# Patient Record
Sex: Male | Born: 1990
Health system: Southern US, Community
[De-identification: ages and names within clinical notes are randomized; demographics above are authoritative.]

## PROBLEM LIST (undated history)

## (undated) DIAGNOSIS — F329 Major depressive disorder, single episode, unspecified: Secondary | ICD-10-CM

## (undated) DIAGNOSIS — A749 Chlamydial infection, unspecified: Secondary | ICD-10-CM

## (undated) DIAGNOSIS — I10 Essential (primary) hypertension: Secondary | ICD-10-CM

## (undated) DIAGNOSIS — B20 Human immunodeficiency virus [HIV] disease: Secondary | ICD-10-CM

## (undated) DIAGNOSIS — F640 Transsexualism: Secondary | ICD-10-CM

## (undated) DIAGNOSIS — K5903 Drug induced constipation: Secondary | ICD-10-CM

## (undated) DIAGNOSIS — F32A Depression, unspecified: Secondary | ICD-10-CM

## (undated) DIAGNOSIS — A539 Syphilis, unspecified: Secondary | ICD-10-CM

## (undated) DIAGNOSIS — E785 Hyperlipidemia, unspecified: Secondary | ICD-10-CM

## (undated) DIAGNOSIS — Z789 Other specified health status: Secondary | ICD-10-CM

## (undated) DIAGNOSIS — Z21 Asymptomatic human immunodeficiency virus [HIV] infection status: Secondary | ICD-10-CM

## (undated) DIAGNOSIS — F419 Anxiety disorder, unspecified: Secondary | ICD-10-CM

## (undated) DIAGNOSIS — F17200 Nicotine dependence, unspecified, uncomplicated: Secondary | ICD-10-CM

## (undated) DIAGNOSIS — K649 Unspecified hemorrhoids: Secondary | ICD-10-CM

## (undated) HISTORY — DX: Depression, unspecified: F32.A

## (undated) HISTORY — DX: Drug induced constipation: K59.03

## (undated) HISTORY — DX: Major depressive disorder, single episode, unspecified: F32.9

## (undated) HISTORY — DX: Nicotine dependence, unspecified, uncomplicated: F17.200

## (undated) HISTORY — DX: Anxiety disorder, unspecified: F41.9

## (undated) HISTORY — DX: Human immunodeficiency virus (HIV) disease: B20

## (undated) HISTORY — DX: Transsexualism: F64.0

## (undated) HISTORY — DX: Other specified health status: Z78.9

## (undated) HISTORY — DX: Chlamydial infection, unspecified: A74.9

## (undated) HISTORY — DX: Hyperlipidemia, unspecified: E78.5

## (undated) HISTORY — DX: Syphilis, unspecified: A53.9

## (undated) HISTORY — DX: Unspecified hemorrhoids: K64.9

## (undated) HISTORY — DX: Asymptomatic human immunodeficiency virus (hiv) infection status: Z21

---

## 2014-06-07 ENCOUNTER — Ambulatory Visit (INDEPENDENT_AMBULATORY_CARE_PROVIDER_SITE_OTHER): Payer: Self-pay

## 2014-06-07 DIAGNOSIS — Z113 Encounter for screening for infections with a predominantly sexual mode of transmission: Secondary | ICD-10-CM

## 2014-06-07 DIAGNOSIS — B20 Human immunodeficiency virus [HIV] disease: Secondary | ICD-10-CM

## 2014-06-07 LAB — COMPLETE METABOLIC PANEL WITH GFR
ALBUMIN: 4.1 g/dL (ref 3.5–5.2)
ALT: 12 U/L (ref 0–53)
AST: 15 U/L (ref 0–37)
Alkaline Phosphatase: 73 U/L (ref 39–117)
BUN: 10 mg/dL (ref 6–23)
CALCIUM: 9.2 mg/dL (ref 8.4–10.5)
CHLORIDE: 103 meq/L (ref 96–112)
CO2: 23 meq/L (ref 19–32)
Creat: 0.74 mg/dL (ref 0.50–1.35)
GFR, Est African American: >89 mL/min
GLUCOSE: 87 mg/dL (ref 70–99)
Potassium: 3.8 mEq/L (ref 3.5–5.3)
SODIUM: 136 meq/L (ref 135–145)
Total Bilirubin: 1.1 mg/dL (ref 0.2–1.2)
Total Protein: 7.2 g/dL (ref 6.0–8.3)

## 2014-06-07 LAB — CBC WITH DIFFERENTIAL/PLATELET
Basophils Absolute: 0 10*3/uL (ref 0.0–0.1)
Basophils Relative: 0 % (ref 0–1)
Eosinophils Absolute: 1.7 10*3/uL — ABNORMAL HIGH (ref 0.0–0.7)
Eosinophils Relative: 17 % — ABNORMAL HIGH (ref 0–5)
HEMATOCRIT: 46.3 % (ref 39.0–52.0)
HEMOGLOBIN: 15.8 g/dL (ref 13.0–17.0)
LYMPHS ABS: 3.2 10*3/uL (ref 0.7–4.0)
LYMPHS PCT: 32 % (ref 12–46)
MCH: 30.5 pg (ref 26.0–34.0)
MCHC: 34.1 g/dL (ref 30.0–36.0)
MCV: 89.4 fL (ref 78.0–100.0)
MONO ABS: 0.9 10*3/uL (ref 0.1–1.0)
MONOS PCT: 9 % (ref 3–12)
MPV: 10.3 fL (ref 9.4–12.4)
NEUTROS PCT: 42 % — AB (ref 43–77)
Neutro Abs: 4.2 10*3/uL (ref 1.7–7.7)
Platelets: 319 10*3/uL (ref 150–400)
RBC: 5.18 MIL/uL (ref 4.22–5.81)
RDW: 14.4 % (ref 11.5–15.5)
WBC: 10.1 10*3/uL (ref 4.0–10.5)

## 2014-06-07 LAB — RPR TITER

## 2014-06-07 LAB — RPR: RPR: REACTIVE — AB

## 2014-06-07 LAB — LIPID PANEL
CHOLESTEROL: 210 mg/dL — AB (ref 0–200)
HDL: 40 mg/dL (ref >39)
LDL Cholesterol: 146 mg/dL — ABNORMAL HIGH (ref 0–99)
TRIGLYCERIDES: 119 mg/dL (ref <150)
Total CHOL/HDL Ratio: 5.3 Ratio
VLDL: 24 mg/dL (ref 0–40)

## 2014-06-08 LAB — URINALYSIS
Bilirubin Urine: NEGATIVE
GLUCOSE, UA: NEGATIVE mg/dL
Hgb urine dipstick: NEGATIVE
Ketones, ur: NEGATIVE mg/dL
Leukocytes, UA: NEGATIVE
Nitrite: NEGATIVE
Protein, ur: NEGATIVE mg/dL
Specific Gravity, Urine: 1.027 (ref 1.005–1.030)
UROBILINOGEN UA: 0.2 mg/dL (ref 0.0–1.0)
pH: 5.5 (ref 5.0–8.0)

## 2014-06-08 LAB — HEPATITIS B SURFACE ANTIBODY,QUALITATIVE: Hep B S Ab: POSITIVE — AB

## 2014-06-08 LAB — HEPATITIS C ANTIBODY: HCV Ab: NEGATIVE

## 2014-06-08 LAB — URINE CYTOLOGY ANCILLARY ONLY
CHLAMYDIA, DNA PROBE: NEGATIVE
NEISSERIA GONORRHEA: NEGATIVE

## 2014-06-08 LAB — T-HELPER CELL (CD4) - (RCID CLINIC ONLY)
CD4 % Helper T Cell: 29 % — ABNORMAL LOW (ref 33–55)
CD4 T CELL ABS: 960 /uL (ref 400–2700)

## 2014-06-08 LAB — FLUORESCENT TREPONEMAL AB(FTA)-IGG-BLD: Fluorescent Treponemal ABS: REACTIVE — AB

## 2014-06-08 LAB — HEPATITIS A ANTIBODY, TOTAL: Hep A Total Ab: NONREACTIVE

## 2014-06-08 LAB — HEPATITIS B SURFACE ANTIGEN: Hepatitis B Surface Ag: NEGATIVE

## 2014-06-08 LAB — HEPATITIS B CORE ANTIBODY, TOTAL: Hep B Core Total Ab: NONREACTIVE

## 2014-06-09 LAB — HIV-1 RNA ULTRAQUANT REFLEX TO GENTYP+
HIV 1 RNA Quant: 5378 copies/mL — ABNORMAL HIGH (ref <20)
HIV-1 RNA Quant, Log: 3.73 {Log} — ABNORMAL HIGH (ref <1.30)

## 2014-06-11 ENCOUNTER — Other Ambulatory Visit: Payer: Self-pay | Admitting: *Deleted

## 2014-06-11 ENCOUNTER — Telehealth: Payer: Self-pay | Admitting: Licensed Clinical Social Worker

## 2014-06-11 DIAGNOSIS — B20 Human immunodeficiency virus [HIV] disease: Secondary | ICD-10-CM

## 2014-06-11 LAB — QUANTIFERON TB GOLD ASSAY (BLOOD)
INTERFERON GAMMA RELEASE ASSAY: NEGATIVE
Mitogen value: 9.63 IU/mL
Quantiferon Nil Value: 0.06 IU/mL
Quantiferon Tb Ag Minus Nil Value: 0.01 IU/mL
TB Ag value: 0.07 IU/mL

## 2014-06-11 MED ORDER — EMTRICITABINE-TENOFOVIR DF 200-300 MG PO TABS: 1.0000 | ORAL_TABLET | Freq: Every day | ORAL | Status: DC

## 2014-06-11 MED ORDER — DOLUTEGRAVIR SODIUM 50 MG PO TABS: 50.0000 mg | ORAL_TABLET | Freq: Every day | ORAL | Status: DC

## 2014-06-11 NOTE — Telephone Encounter (Signed)
Need the name of the last office that he was seen so I can fax the release of information. Left patient a message.

## 2014-06-14 LAB — HLA B*5701: HLA-B*5701 w/rflx HLA-B High: NEGATIVE

## 2014-06-14 NOTE — Progress Notes (Signed)
Patient is [facility] for [facility] of HIV. He is currently on Tivicay and Truvada. He admits to taking them everyday, but has limited amount left. He is waiting on his ADAP to be approved so he can get a refill. [facility] 2015. Patient was diagnosed with syphilis in 2013 1:64 treated with bicillin x 3. Patient was diagnosed with syphilis again in 11/17/2013 1:128 and treated with bicillin x 3. I will work on getting records [facility], faxed released to [phone]. Patient was given return visit with us.

## 2014-06-19 LAB — HIV-1 GENOTYPR PLUS

## 2014-06-20 ENCOUNTER — Encounter: Payer: Self-pay | Admitting: Internal Medicine

## 2014-06-20 DIAGNOSIS — E785 Hyperlipidemia, unspecified: Secondary | ICD-10-CM | POA: Insufficient documentation

## 2014-06-20 DIAGNOSIS — Z8619 Personal history of other infectious and parasitic diseases: Secondary | ICD-10-CM | POA: Insufficient documentation

## 2014-06-20 DIAGNOSIS — B20 Human immunodeficiency virus [HIV] disease: Secondary | ICD-10-CM | POA: Insufficient documentation

## 2014-06-21 ENCOUNTER — Ambulatory Visit (INDEPENDENT_AMBULATORY_CARE_PROVIDER_SITE_OTHER): Payer: Self-pay | Admitting: Internal Medicine

## 2014-06-21 ENCOUNTER — Encounter: Payer: Self-pay | Admitting: Internal Medicine

## 2014-06-21 ENCOUNTER — Ambulatory Visit (INDEPENDENT_AMBULATORY_CARE_PROVIDER_SITE_OTHER): Payer: Self-pay | Admitting: *Deleted

## 2014-06-21 VITALS — Temp 99.1°F | Ht 66.5 in | Wt 138.2 lb

## 2014-06-21 DIAGNOSIS — F419 Anxiety disorder, unspecified: Secondary | ICD-10-CM | POA: Insufficient documentation

## 2014-06-21 DIAGNOSIS — F3161 Bipolar disorder, current episode mixed, mild: Secondary | ICD-10-CM

## 2014-06-21 DIAGNOSIS — B20 Human immunodeficiency virus [HIV] disease: Secondary | ICD-10-CM

## 2014-06-21 DIAGNOSIS — Z23 Encounter for immunization: Secondary | ICD-10-CM

## 2014-06-21 DIAGNOSIS — E785 Hyperlipidemia, unspecified: Secondary | ICD-10-CM

## 2014-06-21 DIAGNOSIS — A563 Chlamydial infection of anus and rectum: Secondary | ICD-10-CM | POA: Insufficient documentation

## 2014-06-21 DIAGNOSIS — Z720 Tobacco use: Secondary | ICD-10-CM

## 2014-06-21 DIAGNOSIS — Z8619 Personal history of other infectious and parasitic diseases: Secondary | ICD-10-CM

## 2014-06-21 DIAGNOSIS — F17210 Nicotine dependence, cigarettes, uncomplicated: Secondary | ICD-10-CM | POA: Insufficient documentation

## 2014-06-21 DIAGNOSIS — F3170 Bipolar disorder, currently in remission, most recent episode unspecified: Secondary | ICD-10-CM

## 2014-06-21 DIAGNOSIS — F319 Bipolar disorder, unspecified: Secondary | ICD-10-CM | POA: Insufficient documentation

## 2014-06-21 MED ORDER — ABACAVIR-DOLUTEGRAVIR-LAMIVUD 600-50-300 MG PO TABS: 1.0000 | ORAL_TABLET | Freq: Every day | ORAL | Status: DC

## 2014-06-21 MED ORDER — DARUNAVIR-COBICISTAT 800-150 MG PO TABS: 1.0000 | ORAL_TABLET | Freq: Every day | ORAL | Status: DC

## 2014-06-21 MED ORDER — ARIPIPRAZOLE 20 MG PO TABS: 20.0000 mg | ORAL_TABLET | Freq: Every day | ORAL | Status: DC

## 2014-06-21 NOTE — Progress Notes (Signed)
Patient ID: Jeffery ParrMichael Allbright, male   DOB: Aug 10, 1990, 23 y.o.   MRN: [id number] HPI: Jeffery ParrMichael Gutt is a 23 y.o. male who is a HIV positive who has recently [facility].   Allergies: No Known Allergies  Vitals: Temp: 99.1 F (37.3 C) (12/17 1350) Temp Source: Oral (12/17 1350)  Past Medical History: Past Medical History  Diagnosis Date  . HIV infection   . Hyperlipidemia   . Syphilis 2013 and 2015  . Anxiety   . Chlamydia   . Depression     Social History: History   Social History  . Marital Status: Single    Spouse Name: N/A    Number of Children: N/A  . Years of Education: N/A   Social History Main Topics  . Smoking status: Current Every Day Smoker -- 0.25 packs/day    Types: Cigarettes  . Smokeless tobacco: None  . Alcohol Use: 0.0 oz/week    0 Not specified per week     Comment: occas  . Drug Use: None     Comment: occas  . Sexual Activity: Not Currently     Comment: given condoms   Other Topics Concern  . None   Social History Narrative    Previous Regimen: Insurance underwritertravirine/Trv, DTG/Trv  Current Regimen: Off  HIV Genotype Composite Data Genotype Dates: 06/07/14  Interpretation of Genotype Data per [employer] HIV Database Nucleoside RTIs: M184I, K219R  lamivudine (3TC) High-level resistance abacavir (ABC) Low-level resistance zidovudine (AZT) Susceptible stavudine (D4T) Susceptible didanosine (DDI) Low-level resistance emtricitabine (FTC) High-level resistance tenofovir (TDF) Susceptible   Non-Nucleoside RTIs: V90I  efavirenz (EFV) Susceptible etravirine (ETR) Susceptible nevirapine (NVP) Susceptible rilpivirine (RPV)           Susceptible   Protease Inhibitors  atazanavir/r (ATV/r) Susceptible darunavir/r (DRV/r) Susceptible fosamprenavir/r (FPV/r) Susceptible indinavir/r (IDV/r) Susceptible lopinavir/r (LPV/r) Susceptible nelfinavir (NFV) Susceptible saquinavir/r (SQV/r) Susceptible tipranavir/r (TPV/r) Susceptible    Integrase Inhibitors      Labs: HIV 1 RNA QUANT (copies/mL)  Date Value  06/07/2014 5378*   CD4 T CELL ABS (/uL)  Date Value  06/07/2014 960   HEP B S AB (no units)  Date Value  06/07/2014 POS*   HEPATITIS B SURFACE AG (no units)  Date Value  06/07/2014 NEGATIVE   HCV AB (no units)  Date Value  06/07/2014 NEGATIVE    CrCl: Estimated Creatinine Clearance: 127.4 mL/min (by C-G formula based on Cr of 0.74).  Lipids:    Component Value Date/Time   CHOL 210* 06/07/2014 1159   TRIG 119 06/07/2014 1159   HDL 40 06/07/2014 1159   CHOLHDL 5.3 06/07/2014 1159   VLDL 24 06/07/2014 1159   LDLCALC 146* 06/07/2014 1159    Assessment: 23 yo who has recently tx from [facility] [place]. He has some mental issues that have been on Abilify up there. He lost his ADAP and interruption with meds ensued. When he first started on ART, he was place on etravirine/truvada in a study. Apparently, that regimen was cont after the study. Subsequently, he was changed to DTG/TRV. I don't think his compliance was that good. He has now developed some resistance to ART. Genotype is listed above. We are going to use Triumeq + Prezobix since he has a hx of resistance. He agreed to it. His ADAP now is approved. Of note, his abilify dose is 20mg  qday.   Recommendations: Start Triumeq 1 PO qday Prezcobix 1 PO qday F/u for labs in a few weeks   Clide CliffPham, Minh Quang, PharmD  Clinical Infectious Disease Pharmacist [facility] 06/21/2014, 2:55 PM

## 2014-06-21 NOTE — Progress Notes (Signed)
Patient ID: Jeffery [name], male   DOB: 1990-08-02, 23 y.o.   MRN: [id number]          Patient Active Problem List   Diagnosis Date Noted  . HIV disease 06/20/2014    Priority: High  . History of chlamydia 06/21/2014  . Bipolar disorder 06/21/2014  . Anxiety 06/21/2014  . Cigarette smoker 06/21/2014  . History of syphilis 06/20/2014  . Dyslipidemia 06/20/2014    Patient's Medications  New Prescriptions   No medications on file  Previous Medications   No medications on file  Modified Medications   No medications on file  Discontinued Medications   DOLUTEGRAVIR (TIVICAY) 50 MG TABLET    Take 1 tablet (50 mg total) by mouth daily.   EMTRICITABINE-TENOFOVIR (TRUVADA) 200-300 MG PER TABLET    Take 1 tablet by mouth daily.    Subjective: Jeffery [name] is in for his first visit to reestablish care here. He was diagnosed during routine HIV screening on his college campus in October 2010. He had previously always tested negative. He states that he has exclusively day. He believes he knows who infected him. He entered into care in [place] where he was going to school shortly after his diagnosis. He was not told that his virus had any resistance at that time. His initial regimen was Truvada and Intelence as part of a research study. He was changed to Truvada and Tivicay this past June after he was told that his virus had developed some resistance to Intelence. He moved to [place] recently to live with his mother and ran out of his medications in August. He states that he was not having problems taking his medications before that time but would often very the time of day that he took the medication. He has 2 semesters left to complete his degree in social work.  She also has a history of bipolar disorder and anxiety. He has been hospitalized 2 times (April 2014 and April 2015) after suicide attempts. He states that these have been triggered by stress related to his cousin suicide which occurred  in April. He had been on Abilify and a second mental health medication that he cannot recall the name of. He has also been out of those medications. He is currently not in counseling hearing [name].  He states that he is not in a relationship but has had 52 male partners in the past year. He states that they always use condoms but he has been diagnosed and treated for syphilis 3 times in the past 3 years.  He is currently not working. He states that his mother is aware of his infection as are 2 partners, 2 friends and some family members. He states that he feels he has good support. He recently started smoking cigarettes. He drinks alcohol socially and states that he has no problems although he did have a DWI in January 2014. He has settled all legal issues related to that charge. He smokes marijuana on occasion but has never used any other street drugs.  Review of Systems: Pertinent items are noted in HPI.  Past Medical History  Diagnosis Date  . HIV infection   . Hyperlipidemia   . Syphilis 2013 and 2015  . Anxiety   . Chlamydia   . Depression     History  Substance Use Topics  . Smoking status: Current Every Day Smoker -- 0.25 packs/day    Types: Cigarettes  . Smokeless tobacco: Not on file  . Alcohol Use: 0.0 oz/week  0 Not specified per week     Comment: occas    Family History  Problem Relation Age of Onset  . Hypertension Mother     No Known Allergies  Objective: Temp: 99.1 F (37.3 C) (12/17 1350) Temp Source: Oral (12/17 1350) Body mass index is 21.98 kg/(m^2).  General: He is in no distress Oral: No oropharyngeal lesions. He has a cracked right mandibular molar Skin: He has a series of moles on his left hand that he describes as a birthmark. He has a skin tag on his lower abdomen. He has a tattoo on his right abdomen Lungs: Clear Cor: Regular S1 and S2 with no murmur Abdomen: Soft and nontender Joints and extremities: Normal Neuro: Alert with normal  speech and conversation Mood: He does not appear to be anxious or depressed currently  Lab Results Lab Results  Component Value Date   WBC 10.1 06/07/2014   HGB 15.8 06/07/2014   HCT 46.3 06/07/2014   MCV 89.4 06/07/2014   PLT 319 06/07/2014    Lab Results  Component Value Date   CREATININE 0.74 06/07/2014   BUN 10 06/07/2014   NA 136 06/07/2014   K 3.8 06/07/2014   CL 103 06/07/2014   CO2 23 06/07/2014    Lab Results  Component Value Date   ALT 12 06/07/2014   AST 15 06/07/2014   ALKPHOS 73 06/07/2014   BILITOT 1.1 06/07/2014    Lab Results  Component Value Date   CHOL 210* 06/07/2014   HDL 40 06/07/2014   LDLCALC 146* 06/07/2014   TRIG 119 06/07/2014   CHOLHDL 5.3 06/07/2014    Lab Results HIV 1 RNA QUANT (copies/mL)  Date Value  06/07/2014 5378*   CD4 T CELL ABS (/uL)  Date Value  06/07/2014 960   Lab results 06/07/2014 HLA B5701 negative Interferon gamma release assay negative GC and Chlamydia screens negative Hepatitis A antibody negative Hepatitis B surface antibody positive Hepatitis C antibody negative  HIV genotype mutations: 184V, 118I, 219R  Assessment: As expected his viral load has reactivated off of antiretroviral therapy. I suspect that he has more mutations and reflected on his most recent genotype. I will change him to Triumeq plus Prezcobix.  I will need to determine what mental health medications he is on and restart them. He has met Jeffery [name], our mental health counselor, today and we will arrange follow-up.  Talk to him about the utmost importance of partner selection and avoiding becoming superinfected with more resistant strains of HIV and other STDs.  I asked him to consider quitting cigarettes completely.  Plan: 1. Change to a salvage regimen of Triumeq and Prezcobix 2. Locate the names and doses of his mental health medications and restart them as soon as possible 3. Start mental health counseling 4. Prevention for  positives counseling provided 5. Cigarette cessation counseling provided 6. Medication adherence counseling provided 7. Influenza vaccination, pneumococcal vaccination and first dose of hepatitis A vaccination today 8. Follow-up next month   Jeffery Bickers, MD [facility] [place] [phone] pager   [phone] cell 06/21/2014, 2:32 PM

## 2014-07-09 ENCOUNTER — Ambulatory Visit: Payer: Self-pay

## 2014-07-19 ENCOUNTER — Telehealth: Payer: Self-pay | Admitting: *Deleted

## 2014-07-19 ENCOUNTER — Ambulatory Visit: Payer: Self-pay

## 2014-07-19 ENCOUNTER — Ambulatory Visit: Payer: Self-pay | Admitting: Internal Medicine

## 2014-07-19 NOTE — Telephone Encounter (Signed)
Called patient about his missed visit today. He thought his appointment was at 2.  Pt rescheduled for 1/28. Andree CossHowell, Acey Woodfield M, RN

## 2014-08-02 ENCOUNTER — Encounter: Payer: Self-pay | Admitting: Internal Medicine

## 2014-08-02 ENCOUNTER — Ambulatory Visit (INDEPENDENT_AMBULATORY_CARE_PROVIDER_SITE_OTHER): Payer: Self-pay | Admitting: Internal Medicine

## 2014-08-02 DIAGNOSIS — Z113 Encounter for screening for infections with a predominantly sexual mode of transmission: Secondary | ICD-10-CM

## 2014-08-02 DIAGNOSIS — Z79899 Other long term (current) drug therapy: Secondary | ICD-10-CM

## 2014-08-02 DIAGNOSIS — B20 Human immunodeficiency virus [HIV] disease: Secondary | ICD-10-CM

## 2014-08-02 LAB — CBC
HCT: 46.3 % (ref 39.0–52.0)
HEMOGLOBIN: 15.6 g/dL (ref 13.0–17.0)
MCH: 30.8 pg (ref 26.0–34.0)
MCHC: 33.7 g/dL (ref 30.0–36.0)
MCV: 91.5 fL (ref 78.0–100.0)
MPV: 9.8 fL (ref 8.6–12.4)
Platelets: 310 10*3/uL (ref 150–400)
RBC: 5.06 MIL/uL (ref 4.22–5.81)
RDW: 14.7 % (ref 11.5–15.5)
WBC: 11.2 10*3/uL — ABNORMAL HIGH (ref 4.0–10.5)

## 2014-08-02 LAB — LIPID PANEL
CHOLESTEROL: 224 mg/dL — AB (ref 0–200)
HDL: 34 mg/dL — ABNORMAL LOW (ref >39)
LDL Cholesterol: 167 mg/dL — ABNORMAL HIGH (ref 0–99)
Total CHOL/HDL Ratio: 6.6 Ratio
Triglycerides: 113 mg/dL (ref <150)
VLDL: 23 mg/dL (ref 0–40)

## 2014-08-02 LAB — COMPREHENSIVE METABOLIC PANEL
ALT: 12 U/L (ref 0–53)
AST: 16 U/L (ref 0–37)
Albumin: 4 g/dL (ref 3.5–5.2)
Alkaline Phosphatase: 73 U/L (ref 39–117)
BUN: 7 mg/dL (ref 6–23)
CO2: 27 mEq/L (ref 19–32)
CREATININE: 0.88 mg/dL (ref 0.50–1.35)
Calcium: 9 mg/dL (ref 8.4–10.5)
Chloride: 103 mEq/L (ref 96–112)
Glucose, Bld: 72 mg/dL (ref 70–99)
POTASSIUM: 4.1 meq/L (ref 3.5–5.3)
Sodium: 137 mEq/L (ref 135–145)
Total Bilirubin: 0.5 mg/dL (ref 0.2–1.2)
Total Protein: 7.1 g/dL (ref 6.0–8.3)

## 2014-08-02 NOTE — Progress Notes (Signed)
Patient ID: Jeffery [name], male   DOB: 04-07-1991, 24 y.o.   MRN: [id number]          Patient Active Problem List   Diagnosis Date Noted  . HIV disease 06/20/2014    Priority: High  . History of chlamydia 06/21/2014  . Bipolar disorder 06/21/2014  . Anxiety 06/21/2014  . Cigarette smoker 06/21/2014  . History of syphilis 06/20/2014  . Dyslipidemia 06/20/2014    Patient's Medications  New Prescriptions   No medications on file  Previous Medications   ABACAVIR-DOLUTEGRAVIR-LAMIVUD 600-50-300 MG TABS    Take 1 tablet by mouth daily.   ARIPIPRAZOLE (ABILIFY) 20 MG TABLET    Take 1 tablet (20 mg total) by mouth daily.   DARUNAVIR-COBICISTAT (PREZCOBIX) 800-150 MG PER TABLET    Take 1 tablet by mouth daily. Swallow whole. Do NOT crush, break or chew tablets. Take with food.  Modified Medications   No medications on file  Discontinued Medications   No medications on file    Subjective: Jeffery [name] started on his new salvage regimen of Triumeq and Prezcobix for his HIV infection after his initial visit last month. He takes them each evening at 11 PM. He tolerates them well and has not missed any doses. He was unable to afford his Abilify. He states that his mood remains good and normal. He is seeing our mental health counselor, Franne FortsKenny Shore. He is cutting down on his cigarettes with the hope of being able to quit completely soon.  Recently he has been bothered by tender bumps that will occasionally draining pus. They're most noticeable in the distribution of his beard but also in his groin. He has never had problems like that before.  Review of Systems: Constitutional: negative Eyes: negative Ears, nose, mouth, throat, and face: negative Respiratory: negative Cardiovascular: negative Gastrointestinal: negative Genitourinary:negative  Past Medical History  Diagnosis Date  . HIV infection   . Hyperlipidemia   . Syphilis 2013 and 2015  . Anxiety   . Chlamydia   . Depression      History  Substance Use Topics  . Smoking status: Current Every Day Smoker -- 0.25 packs/day    Types: Cigarettes  . Smokeless tobacco: Not on file  . Alcohol Use: 0.0 oz/week    0 Not specified per week     Comment: occas    Family History  Problem Relation Age of Onset  . Hypertension Mother     No Known Allergies  Objective: Temp: 98.2 F (36.8 C) (01/28 0947) Temp Source: Oral (01/28 0947) BP: 143/92 mmHg (01/28 0947) Pulse Rate: 85 (01/28 0947) Body mass index is 22.42 kg/(m^2).  General: He is in good spirits Oral: No oropharyngeal lesions Skin: He has some tender nodules and pustules in his beard and one small boil in his groin at the base of his scrotum  Lungs: Clear Cor: Regular S1 and S2 with no murmurs Abdomen: Nontender Mood and affect: Appropriate and normal  Lab Results Lab Results  Component Value Date   WBC 10.1 06/07/2014   HGB 15.8 06/07/2014   HCT 46.3 06/07/2014   MCV 89.4 06/07/2014   PLT 319 06/07/2014    Lab Results  Component Value Date   CREATININE 0.74 06/07/2014   BUN 10 06/07/2014   NA 136 06/07/2014   K 3.8 06/07/2014   CL 103 06/07/2014   CO2 23 06/07/2014    Lab Results  Component Value Date   ALT 12 06/07/2014   AST 15 06/07/2014  ALKPHOS 73 06/07/2014   BILITOT 1.1 06/07/2014    Lab Results  Component Value Date   CHOL 210* 06/07/2014   HDL 40 06/07/2014   LDLCALC 146* 06/07/2014   TRIG 119 06/07/2014   CHOLHDL 5.3 06/07/2014    Lab Results HIV 1 RNA QUANT (copies/mL)  Date Value  06/07/2014 5378*   CD4 T CELL ABS (/uL)  Date Value  06/07/2014 960     Assessment: He is off to a good start with his HIV salvage regimen. I will repeat his lab work today.  He will continue mental health counseling and we will try to help him get a supply of Abilify.  I encouraged him to go ahead with his plan to quit smoking cigarettes.  I will repeat his RPR today after his most recent round of therapy for late  latent syphilis.  Plan: 1. Continue current antiretroviral medication 2. Check lab work today 3. Mental health counseling 4. Cigarette cessation counseling provided 5. Follow-up in 4 weeks   Cliffton Asters, MD [facility] [phone] pager   [phone] cell 08/02/2014, 10:04 AM

## 2014-08-03 LAB — FLUORESCENT TREPONEMAL AB(FTA)-IGG-BLD: Fluorescent Treponemal ABS: REACTIVE — AB

## 2014-08-03 LAB — T-HELPER CELL (CD4) - (RCID CLINIC ONLY)
CD4 % Helper T Cell: 25 % — ABNORMAL LOW (ref 33–55)
CD4 T Cell Abs: 750 /uL (ref 400–2700)

## 2014-08-03 LAB — HIV-1 RNA QUANT-NO REFLEX-BLD
HIV 1 RNA QUANT: 150 {copies}/mL — AB (ref <20)
HIV-1 RNA Quant, Log: 2.18 {Log} — ABNORMAL HIGH (ref <1.30)

## 2014-08-03 LAB — RPR: RPR: REACTIVE — AB

## 2014-08-03 LAB — RPR TITER: RPR Titer: 1:16 {titer} — AB

## 2014-08-06 ENCOUNTER — Other Ambulatory Visit: Payer: Self-pay | Admitting: Licensed Clinical Social Worker

## 2014-08-06 ENCOUNTER — Telehealth: Payer: Self-pay | Admitting: Licensed Clinical Social Worker

## 2014-08-06 MED ORDER — VENLAFAXINE HCL ER 75 MG PO CP24: 75.0000 mg | ORAL_CAPSULE | Freq: Every day | ORAL | Status: DC

## 2014-08-06 MED ORDER — DOXYCYCLINE HYCLATE 100 MG PO TABS: 100.0000 mg | ORAL_TABLET | Freq: Two times a day (BID) | ORAL | Status: DC

## 2014-08-06 NOTE — Telephone Encounter (Signed)
Patient unable to get Abilify on ADAP, I will send to Randolm IdolPam Jones to see if she can help.

## 2014-08-24 ENCOUNTER — Other Ambulatory Visit: Payer: Self-pay | Admitting: Internal Medicine

## 2014-08-24 ENCOUNTER — Other Ambulatory Visit (INDEPENDENT_AMBULATORY_CARE_PROVIDER_SITE_OTHER): Payer: Self-pay

## 2014-08-24 ENCOUNTER — Telehealth: Payer: Self-pay | Admitting: *Deleted

## 2014-08-24 DIAGNOSIS — Z7251 High risk heterosexual behavior: Secondary | ICD-10-CM

## 2014-08-24 DIAGNOSIS — Z113 Encounter for screening for infections with a predominantly sexual mode of transmission: Secondary | ICD-10-CM

## 2014-08-24 NOTE — Telephone Encounter (Signed)
Patient walked into clinic stating he had unprotected oral sex with a partner that had bumps on his penis and rectal area. He was concerned he had contracted syphilis and stated he had this before. RPR and urine ancillary lab ordered. Patient has follow up scheduled with Dr. Orvan Falconerampbell for 09/04/14. Wendall MolaJacqueline Deward Sebek

## 2014-08-25 LAB — RPR TITER: RPR Titer: 1:32 {titer} — AB

## 2014-08-25 LAB — RPR: RPR Ser Ql: REACTIVE — AB

## 2014-08-27 ENCOUNTER — Ambulatory Visit: Payer: Self-pay

## 2014-08-27 ENCOUNTER — Other Ambulatory Visit: Payer: Self-pay | Admitting: *Deleted

## 2014-08-27 DIAGNOSIS — B20 Human immunodeficiency virus [HIV] disease: Secondary | ICD-10-CM

## 2014-08-27 LAB — URINE CYTOLOGY ANCILLARY ONLY
Chlamydia: NEGATIVE
NEISSERIA GONORRHEA: NEGATIVE

## 2014-08-27 MED ORDER — DARUNAVIR-COBICISTAT 800-150 MG PO TABS: 1.0000 | ORAL_TABLET | Freq: Every day | ORAL | Status: DC

## 2014-08-27 MED ORDER — ABACAVIR-DOLUTEGRAVIR-LAMIVUD 600-50-300 MG PO TABS: 1.0000 | ORAL_TABLET | Freq: Every day | ORAL | Status: DC

## 2014-08-27 NOTE — Telephone Encounter (Signed)
ADAP Application 

## 2014-08-28 LAB — FLUORESCENT TREPONEMAL AB(FTA)-IGG-BLD: FLUORESCENT TREPONEMAL ABS: REACTIVE — AB

## 2014-09-04 ENCOUNTER — Ambulatory Visit (INDEPENDENT_AMBULATORY_CARE_PROVIDER_SITE_OTHER): Payer: Self-pay | Admitting: Internal Medicine

## 2014-09-04 ENCOUNTER — Ambulatory Visit: Payer: Self-pay

## 2014-09-04 ENCOUNTER — Encounter: Payer: Self-pay | Admitting: Internal Medicine

## 2014-09-04 VITALS — BP 135/90 | HR 76 | Temp 98.0°F | Wt 139.0 lb

## 2014-09-04 DIAGNOSIS — F3170 Bipolar disorder, currently in remission, most recent episode unspecified: Secondary | ICD-10-CM

## 2014-09-04 DIAGNOSIS — A539 Syphilis, unspecified: Secondary | ICD-10-CM

## 2014-09-04 DIAGNOSIS — B20 Human immunodeficiency virus [HIV] disease: Secondary | ICD-10-CM

## 2014-09-04 DIAGNOSIS — F3161 Bipolar disorder, current episode mixed, mild: Secondary | ICD-10-CM

## 2014-09-04 DIAGNOSIS — Z23 Encounter for immunization: Secondary | ICD-10-CM

## 2014-09-04 MED ORDER — PENICILLIN G BENZATHINE 1200000 UNIT/2ML IM SUSP
1.2000 10*6.[IU] | Freq: Once | INTRAMUSCULAR | Status: AC
  Administered 2014-09-04: 1.2 10*6.[IU] via INTRAMUSCULAR

## 2014-09-04 MED ORDER — ARIPIPRAZOLE 20 MG PO TABS: 20.0000 mg | ORAL_TABLET | Freq: Every day | ORAL | Status: DC

## 2014-09-04 NOTE — Addendum Note (Signed)
Addended by: Jennet MaduroESTRIDGE, DENISE D on: 09/04/2014 09:42 AM   Modules accepted: Orders

## 2014-09-04 NOTE — Progress Notes (Signed)
Patient ID: Jeffery [name], male   DOB: 1990-07-17, 24 y.o.   MRN: [id number]          Patient Active Problem List   Diagnosis Date Noted  . HIV disease 06/20/2014    Priority: High  . History of chlamydia 06/21/2014  . Bipolar disorder 06/21/2014  . Anxiety 06/21/2014  . Cigarette smoker 06/21/2014  . History of syphilis 06/20/2014  . Dyslipidemia 06/20/2014    Patient's Medications  New Prescriptions   No medications on file  Previous Medications   ABACAVIR-DOLUTEGRAVIR-LAMIVUD 600-50-300 MG TABS    Take 1 tablet by mouth daily.   ARIPIPRAZOLE (ABILIFY) 20 MG TABLET    Take 1 tablet (20 mg total) by mouth daily.   DARUNAVIR-COBICISTAT (PREZCOBIX) 800-150 MG PER TABLET    Take 1 tablet by mouth daily. Swallow whole. Do NOT crush, break or chew tablets. Take with food.   VENLAFAXINE XR (EFFEXOR-XR) 75 MG 24 HR CAPSULE    Take 1 capsule (75 mg total) by mouth daily with breakfast.  Modified Medications   No medications on file  Discontinued Medications   DOXYCYCLINE (VIBRA-TABS) 100 MG TABLET    Take 1 tablet (100 mg total) by mouth 2 (two) times daily.    Subjective: Jeffery [name] is in for a work in visit for his HIV infection. He continues to take Triumeq and Prezcobix without difficulty. He takes it just before bedtime with a sandwich. He believes he is missed only one dose since his last visit when he came home late and forgot to take it. He now keeps a dose of medication with him at all times. His mood is reasonably good although he states that he still has some periods where he feels down. He has not been able to get his Abilify but he is taking his Effexor. He did go out to a club recently and got drunk. He had unprotected oral sex with a previous partner noted that the partner had bumps on his penis. The partner said that they were genital warts. He was concerned that he might have been reinfected with syphilis or other STI's.  Review of Systems: Constitutional:  negative Eyes: negative Ears, nose, mouth, throat, and face: negative Respiratory: negative Cardiovascular: negative Gastrointestinal: negative Genitourinary:negative  Past Medical History  Diagnosis Date  . HIV infection   . Hyperlipidemia   . Syphilis 2013 and 2015  . Anxiety   . Chlamydia   . Depression     History  Substance Use Topics  . Smoking status: Current Every Day Smoker -- 0.25 packs/day    Types: Cigarettes  . Smokeless tobacco: Not on file  . Alcohol Use: 0.0 oz/week    0 Standard drinks or equivalent per week     Comment: occas    Family History  Problem Relation Age of Onset  . Hypertension Mother     No Known Allergies  Objective: Temp: 98 F (36.7 C) (03/01 0854) Temp Source: Oral (03/01 0854) BP: 135/90 mmHg (03/01 0854) Pulse Rate: 76 (03/01 0854) Body mass index is 22.1 kg/(m^2).  General: He is smiling and in good spirits Oral: No oropharyngeal lesions Skin: No rash Lungs: Clear Cor: Regular S1 and S2 no murmurs Abdomen: Nontender   Lab Results Lab Results  Component Value Date   WBC 11.2* 08/02/2014   HGB 15.6 08/02/2014   HCT 46.3 08/02/2014   MCV 91.5 08/02/2014   PLT 310 08/02/2014    Lab Results  Component Value Date   CREATININE 0.88  08/02/2014   BUN 7 08/02/2014   NA 137 08/02/2014   K 4.1 08/02/2014   CL 103 08/02/2014   CO2 27 08/02/2014    Lab Results  Component Value Date   ALT 12 08/02/2014   AST 16 08/02/2014   ALKPHOS 73 08/02/2014   BILITOT 0.5 08/02/2014    Lab Results  Component Value Date   CHOL 224* 08/02/2014   HDL 34* 08/02/2014   LDLCALC 167* 08/02/2014   TRIG 113 08/02/2014   CHOLHDL 6.6 08/02/2014    Lab Results HIV 1 RNA QUANT (copies/mL)  Date Value  08/02/2014 150*  06/07/2014 5378*   CD4 T CELL ABS (/uL)  Date Value  08/02/2014 750  06/07/2014 960     Assessment: Jeffery [name] HIV infection is coming under better control.  I will retreat him for late latent syphilis as  his RPR has gone up to 1:32. I talked to him about the utmost importance of partner selection and condom use to protect himself from repeated exposure to [facility].  His depression is under better control. We will see if we can find a source to help him get his Abilify.  Plan: 1. Continue current antiretroviral therapy 2. Benzathine penicillin 2.4 million units weekly 3 3. Follow-up after lab work in 3 months   Cliffton AstersJohn Reizy Dunlow, MD [facility] [phone] pager   [phone] cell 09/04/2014, 9:16 AM

## 2014-09-11 ENCOUNTER — Ambulatory Visit (INDEPENDENT_AMBULATORY_CARE_PROVIDER_SITE_OTHER): Payer: Self-pay | Admitting: *Deleted

## 2014-09-11 DIAGNOSIS — A539 Syphilis, unspecified: Secondary | ICD-10-CM

## 2014-09-11 MED ORDER — PENICILLIN G BENZATHINE 1200000 UNIT/2ML IM SUSP
1.2000 10*6.[IU] | Freq: Once | INTRAMUSCULAR | Status: AC
  Administered 2014-09-11: 1.2 10*6.[IU] via INTRAMUSCULAR

## 2014-09-18 ENCOUNTER — Ambulatory Visit (INDEPENDENT_AMBULATORY_CARE_PROVIDER_SITE_OTHER): Payer: Self-pay | Admitting: *Deleted

## 2014-09-18 DIAGNOSIS — A539 Syphilis, unspecified: Secondary | ICD-10-CM

## 2014-09-18 MED ORDER — PENICILLIN G BENZATHINE 1200000 UNIT/2ML IM SUSP
1.2000 10*6.[IU] | Freq: Once | INTRAMUSCULAR | Status: AC
  Administered 2014-09-18: 1.2 10*6.[IU] via INTRAMUSCULAR

## 2014-09-18 NOTE — Progress Notes (Signed)
Bicillin injection #3.  Pt complained of blister-like area on his right lower lip.  Had one like it 2 weeks ago which opened and has not left a scar.  Pt requesting appt.

## 2014-09-18 NOTE — Patient Instructions (Signed)
Made pt an appt to see MD tomorrow at 2 PM for lesion on lower lip.

## 2014-09-19 ENCOUNTER — Ambulatory Visit: Payer: Self-pay | Admitting: Internal Medicine

## 2014-10-11 ENCOUNTER — Encounter: Payer: Self-pay | Admitting: Licensed Clinical Social Worker

## 2014-12-18 ENCOUNTER — Telehealth: Payer: Self-pay | Admitting: *Deleted

## 2014-12-18 ENCOUNTER — Ambulatory Visit: Payer: Self-pay | Admitting: Internal Medicine

## 2014-12-18 NOTE — Telephone Encounter (Signed)
Left message to call for a new appt.

## 2015-01-19 ENCOUNTER — Encounter (HOSPITAL_COMMUNITY): Payer: Self-pay | Admitting: *Deleted

## 2015-01-19 ENCOUNTER — Emergency Department (INDEPENDENT_AMBULATORY_CARE_PROVIDER_SITE_OTHER)
Admission: EM | Admit: 2015-01-19 | Discharge: 2015-01-19 | Disposition: A | Discharge status: Home / Self Care | Payer: Self-pay | Source: Home / Self Care | Attending: Family Medicine | Admitting: Family Medicine

## 2015-01-19 DIAGNOSIS — Z041 Encounter for examination and observation following transport accident: Secondary | ICD-10-CM

## 2015-01-19 HISTORY — DX: Essential (primary) hypertension: I10

## 2015-01-19 MED ORDER — METHOCARBAMOL 500 MG PO TABS: 500.0000 mg | ORAL_TABLET | Freq: Four times a day (QID) | ORAL | Status: DC | PRN
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
## 2015-01-19 NOTE — ED Notes (Signed)
Called from waiting room - no response.
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
## 2015-02-09 ENCOUNTER — Encounter (HOSPITAL_COMMUNITY): Payer: Self-pay | Admitting: Family Medicine

## 2015-02-09 ENCOUNTER — Emergency Department (HOSPITAL_COMMUNITY)
Admission: EM | Admit: 2015-02-09 | Discharge: 2015-02-09 | Disposition: A | Discharge status: Home / Self Care | Payer: BLUE CROSS/BLUE SHIELD | Attending: Emergency Medicine | Admitting: Emergency Medicine

## 2015-02-09 ENCOUNTER — Emergency Department (HOSPITAL_COMMUNITY): Payer: BLUE CROSS/BLUE SHIELD

## 2015-02-09 DIAGNOSIS — Z21 Asymptomatic human immunodeficiency virus [HIV] infection status: Secondary | ICD-10-CM | POA: Diagnosis not present

## 2015-02-09 DIAGNOSIS — Z720 Tobacco use: Secondary | ICD-10-CM | POA: Insufficient documentation

## 2015-02-09 DIAGNOSIS — Z8639 Personal history of other endocrine, nutritional and metabolic disease: Secondary | ICD-10-CM | POA: Diagnosis not present

## 2015-02-09 DIAGNOSIS — I10 Essential (primary) hypertension: Secondary | ICD-10-CM | POA: Insufficient documentation

## 2015-02-09 DIAGNOSIS — Z8659 Personal history of other mental and behavioral disorders: Secondary | ICD-10-CM | POA: Diagnosis not present

## 2015-02-09 DIAGNOSIS — N50812 Left testicular pain: Secondary | ICD-10-CM

## 2015-02-09 DIAGNOSIS — N451 Epididymitis: Secondary | ICD-10-CM | POA: Diagnosis not present

## 2015-02-09 DIAGNOSIS — Z8619 Personal history of other infectious and parasitic diseases: Secondary | ICD-10-CM | POA: Insufficient documentation

## 2015-02-09 DIAGNOSIS — N508 Other specified disorders of male genital organs: Secondary | ICD-10-CM | POA: Diagnosis present

## 2015-02-09 LAB — URINALYSIS, ROUTINE W REFLEX MICROSCOPIC
BILIRUBIN URINE: NEGATIVE
Glucose, UA: NEGATIVE mg/dL
Hgb urine dipstick: NEGATIVE
Ketones, ur: NEGATIVE mg/dL
LEUKOCYTES UA: NEGATIVE
Nitrite: NEGATIVE
PROTEIN: NEGATIVE mg/dL
SPECIFIC GRAVITY, URINE: 1.02 (ref 1.005–1.030)
Urobilinogen, UA: 0.2 mg/dL (ref 0.0–1.0)
pH: 7.5 (ref 5.0–8.0)

## 2015-02-09 MED ORDER — IBUPROFEN 600 MG PO TABS: 600.0000 mg | ORAL_TABLET | Freq: Three times a day (TID) | ORAL | Status: DC | PRN

## 2015-02-09 MED ORDER — CEFTRIAXONE SODIUM 250 MG IJ SOLR
250.0000 mg | Freq: Once | INTRAMUSCULAR | Status: AC
  Administered 2015-02-09: 250 mg via INTRAMUSCULAR
  Filled 2015-02-09: qty 250

## 2015-02-09 MED ORDER — LIDOCAINE HCL (PF) 1 % IJ SOLN
INTRAMUSCULAR | Status: AC
  Filled 2015-02-09: qty 5

## 2015-02-09 MED ORDER — OXYCODONE-ACETAMINOPHEN 5-325 MG PO TABS
2.0000 | ORAL_TABLET | Freq: Once | ORAL | Status: AC
  Administered 2015-02-09: 2 via ORAL
  Filled 2015-02-09: qty 2

## 2015-02-09 MED ORDER — IBUPROFEN 200 MG PO TABS
600.0000 mg | ORAL_TABLET | Freq: Once | ORAL | Status: AC
  Administered 2015-02-09: 600 mg via ORAL
  Filled 2015-02-09: qty 3

## 2015-02-09 MED ORDER — DOXYCYCLINE HYCLATE 100 MG PO CAPS: 100.0000 mg | ORAL_CAPSULE | Freq: Two times a day (BID) | ORAL | Status: DC

## 2015-02-09 MED ORDER — DOXYCYCLINE HYCLATE 100 MG PO TABS
100.0000 mg | ORAL_TABLET | Freq: Once | ORAL | Status: AC
  Administered 2015-02-09: 100 mg via ORAL
  Filled 2015-02-09: qty 1

## 2015-02-09 NOTE — Discharge Instructions (Signed)
Epididymitis °Epididymitis is a swelling (inflammation) of the epididymis. The epididymis is a cord-like structure along the back part of the testicle. Epididymitis is usually, but not always, caused by infection. This is usually a sudden problem beginning with chills, fever and pain behind the scrotum and in the testicle. There may be swelling and redness of the testicle. °DIAGNOSIS  °Physical examination will reveal a tender, swollen epididymis. Sometimes, cultures are obtained from the urine or from prostate secretions to help find out if there is an infection or if the cause is a different problem. Sometimes, blood work is performed to see if your white blood cell count is elevated and if a germ (bacterial) or viral infection is present. Using this knowledge, an appropriate medicine which kills germs (antibiotic) can be chosen by your caregiver. A viral infection causing epididymitis will most often go away (resolve) without treatment. °HOME CARE INSTRUCTIONS  °· Hot sitz baths for 20 minutes, 4 times per day, may help relieve pain. °· Only take over-the-counter or prescription medicines for pain, discomfort or fever as directed by your caregiver. °· Take all medicines, including antibiotics, as directed. Take the antibiotics for the full prescribed length of time even if you are feeling better. °· It is very important to keep all follow-up appointments. °SEEK IMMEDIATE MEDICAL CARE IF:  °· You have a fever. °· You have pain not relieved with medicines. °· You have any worsening of your problems. °· Your pain seems to come and go. °· You develop pain, redness, and swelling in the scrotum and surrounding areas. °MAKE SURE YOU:  °· Understand these instructions. °· Will watch your condition. °· Will get help right away if you are not doing well or get worse. °Document Released: 06/19/2000 Document Revised: 09/14/2011 Document Reviewed: 05/09/2009 °ExitCare® Patient Information ©2015 [employer]. This information  is not intended to replace advice given to you by your health care provider. Make sure you discuss any questions you have with your health care provider. ° °

## 2015-02-09 NOTE — ED Notes (Signed)
Pt verbalizes understanding of d/c instructions and denies any further needs at this time. 

## 2015-02-09 NOTE — ED Provider Notes (Signed)
CSN: [id number]     Arrival date & time 02/09/15  1136 History   First MD Initiated Contact with Patient 02/09/15 1142     Chief Complaint  Patient presents with  . Testicle Pain      HPI Patient presents to the emergency department complaining of left testicle pain for 3-4 days.  He states he also feels a lump behind his left testicle.  He reports that his ejaculation had had a slight yellow color for several days but since has cleared and normalized.  Denies penile discharge.  No fevers or chills.  No abdominal pain.  Denies new sexual contacts.   Past Medical History  Diagnosis Date  . HIV infection   . Hyperlipidemia   . Syphilis 2013 and 2015  . Anxiety   . Chlamydia   . Depression   . Hypertension     Pt went off meds on his own; has been monitored without any problems   History reviewed. No pertinent past surgical history. Family History  Problem Relation Age of Onset  . Hypertension Mother    History  Substance Use Topics  . Smoking status: Current Every Day Smoker -- 0.25 packs/day    Types: Cigarettes  . Smokeless tobacco: Not on file  . Alcohol Use: Yes     Comment: occasional    Review of Systems  All other systems reviewed and are negative.     Allergies  Review of patient's allergies indicates no known allergies.  Home Medications   Prior to Admission medications   Medication Sig Start Date End Date Taking? Authorizing Provider  Abacavir-Dolutegravir-Lamivud 600-50-300 MG TABS Take 1 tablet by mouth daily. 08/27/14  Yes Gardiner Barefoot, MD  darunavir-cobicistat (PREZCOBIX) 800-150 MG per tablet Take 1 tablet by mouth daily. Swallow whole. Do NOT crush, break or chew tablets. Take with food. 08/27/14  Yes Gardiner Barefoot, MD  ARIPiprazole (ABILIFY) 20 MG tablet Take 1 tablet (20 mg total) by mouth daily. Patient not taking: Reported on 02/09/2015 09/04/14   Cliffton Asters, MD  doxycycline (VIBRAMYCIN) 100 MG capsule Take 1 capsule (100 mg total) by mouth 2  (two) times daily. 02/09/15   Azalia Bilis, MD  ibuprofen (ADVIL,MOTRIN) 600 MG tablet Take 1 tablet (600 mg total) by mouth every 8 (eight) hours as needed. 02/09/15   Azalia Bilis, MD  methocarbamol (ROBAXIN) 500 MG tablet Take 1 tablet (500 mg total) by mouth every 6 (six) hours as needed for muscle spasms. Patient not taking: Reported on 02/09/2015 01/19/15   Linna Hoff, MD  venlafaxine XR (EFFEXOR-XR) 75 MG 24 hr capsule Take 1 capsule (75 mg total) by mouth daily with breakfast. Patient not taking: Reported on 02/09/2015 08/06/14   Cliffton Asters, MD   BP 126/74 mmHg  Pulse 68  Temp(Src) 98.8 F (37.1 C) (Oral)  Resp 22  SpO2 98% Physical Exam  Constitutional: He is oriented to person, place, and time. He appears well-developed and well-nourished.  HENT:  Head: Normocephalic and atraumatic.  Eyes: EOM are normal.  Neck: Normal range of motion.  Cardiovascular: Normal rate, regular rhythm, normal heart sounds and intact distal pulses.   Pulmonary/Chest: Effort normal and breath sounds normal. No respiratory distress.  Abdominal: Soft. He exhibits no distension. There is no tenderness.  Genitourinary:  Mild left testicle tenderness and tenderness of his left epididymitis.  Circumcised penis.  No penile discharge.  No scrotal changes.  No significant swelling of his left testicle as compared to his right.  Musculoskeletal: Normal range of motion.  Neurological: He is alert and oriented to person, place, and time.  Skin: Skin is warm and dry.  Psychiatric: He has a normal mood and affect. Judgment normal.  Nursing note and vitals reviewed.   ED Course  Procedures (including critical care time) Labs Review Labs Reviewed  URINALYSIS, ROUTINE W REFLEX MICROSCOPIC (NOT AT [facility])    Imaging Review US Scrotum  02/09/2015   CLINICAL DATA:  Left scrotal pain.  EXAM: SCROTAL ULTRASOUND  DOPPLER ULTRASOUND OF THE TESTICLES  TECHNIQUE: Complete ultrasound examination of the testicles, epididymis,  and other scrotal structures was performed. Color and spectral Doppler ultrasound were also utilized to evaluate blood flow to the testicles.  COMPARISON:  None.  FINDINGS: Right testicle  Measurements: 5 cm x 2 cm x 3.3 cm. No mass. Normal overall echogenicity. Several small calcifications.  Left testicle  Measurements: 4.8 cm x 2.2 cm x 3.2 cm. No mass or microlithiasis visualized.  Right epididymis:  Normal in size and appearance.  Left epididymis: Enlarged and heterogeneous with increased vascularity, vertically along the tail.  Hydrocele:  Minimal left hydrocele.  No right hydrocele.  Varicocele:  None visualized.  Pulsed Doppler interrogation of both testes demonstrates normal low resistance arterial and venous waveforms bilaterally.  IMPRESSION: 1. Left epididymitis. 2. No other acute finding.  No testicular mass or torsion. 3. Few small right testicular calcifications. Current literature suggests that testicular microlithiasis is not a significant independent risk factor for development of testicular carcinoma, and that follow up imaging is not warranted in the absence of other risk factors. Monthly testicular self-examination and annual physical exams are considered appropriate surveillance. If patient has other risk factors for testicular carcinoma, then referral to Urology should be considered. (Reference: DeCastro, et al.: A 5-Year Follow up Study of Asymptomatic Men with Testicular Microlithiasis. J Urol 2008; 179:1420-1423.)   Electronically Signed   By: Amie Portland M.D.   On: 02/09/2015 13:38   [place] Art/ven Flow Abd Pelv Doppler Limited  02/09/2015   CLINICAL DATA:  Left scrotal pain.  EXAM: SCROTAL ULTRASOUND  DOPPLER ULTRASOUND OF THE TESTICLES  TECHNIQUE: Complete ultrasound examination of the testicles, epididymis, and other scrotal structures was performed. Color and spectral Doppler ultrasound were also utilized to evaluate blood flow to the testicles.  COMPARISON:  None.  FINDINGS: Right  testicle  Measurements: 5 cm x 2 cm x 3.3 cm. No mass. Normal overall echogenicity. Several small calcifications.  Left testicle  Measurements: 4.8 cm x 2.2 cm x 3.2 cm. No mass or microlithiasis visualized.  Right epididymis:  Normal in size and appearance.  Left epididymis: Enlarged and heterogeneous with increased vascularity, vertically along the tail.  Hydrocele:  Minimal left hydrocele.  No right hydrocele.  Varicocele:  None visualized.  Pulsed Doppler interrogation of both testes demonstrates normal low resistance arterial and venous waveforms bilaterally.  IMPRESSION: 1. Left epididymitis. 2. No other acute finding.  No testicular mass or torsion. 3. Few small right testicular calcifications. Current literature suggests that testicular microlithiasis is not a significant independent risk factor for development of testicular carcinoma, and that follow up imaging is not warranted in the absence of other risk factors. Monthly testicular self-examination and annual physical exams are considered appropriate surveillance. If patient has other risk factors for testicular carcinoma, then referral to Urology should be considered. (Reference: DeCastro, et al.: A 5-Year Follow up Study of Asymptomatic Men with Testicular Microlithiasis. J Urol 2008; 179:1420-1423.)   Electronically Signed  By: Amie Portland M.D.   On: 02/09/2015 13:38  I personally reviewed the imaging tests through PACS system I reviewed available ER/hospitalization records through the EMR    EKG Interpretation None      MDM   Final diagnoses:  Pain in left testicle  Epididymitis    Epididymitis.  Patient be treated with antibiotics.  Discharge home in good condition.    Azalia Bilis, MD 02/09/15 [phone]

## 2015-02-09 NOTE — ED Notes (Signed)
Pt here for lump to left testicle that is hard ans painful. sts his ejaculation has been off in color.

## 2015-02-16 ENCOUNTER — Other Ambulatory Visit: Payer: Self-pay | Admitting: Internal Medicine

## 2015-02-16 DIAGNOSIS — B20 Human immunodeficiency virus [HIV] disease: Secondary | ICD-10-CM

## 2015-05-27 ENCOUNTER — Other Ambulatory Visit: Payer: BLUE CROSS/BLUE SHIELD

## 2015-05-27 DIAGNOSIS — B20 Human immunodeficiency virus [HIV] disease: Secondary | ICD-10-CM

## 2015-05-27 LAB — RPR: RPR: REACTIVE — AB

## 2015-05-27 LAB — RPR TITER

## 2015-05-28 LAB — FLUORESCENT TREPONEMAL AB(FTA)-IGG-BLD: Fluorescent Treponemal ABS: REACTIVE — AB

## 2015-05-29 LAB — HIV-1 RNA QUANT-NO REFLEX-BLD
HIV 1 RNA Quant: 3136 copies/mL — ABNORMAL HIGH (ref <20)
HIV-1 RNA Quant, Log: 3.5 Log copies/mL — ABNORMAL HIGH (ref <1.30)

## 2015-05-29 LAB — T-HELPER CELLS (CD4) COUNT (NOT AT ARMC)

## 2015-06-05 ENCOUNTER — Other Ambulatory Visit: Payer: Self-pay | Admitting: *Deleted

## 2015-06-05 DIAGNOSIS — B20 Human immunodeficiency virus [HIV] disease: Secondary | ICD-10-CM

## 2015-06-06 ENCOUNTER — Ambulatory Visit: Payer: BLUE CROSS/BLUE SHIELD

## 2015-06-06 ENCOUNTER — Other Ambulatory Visit: Payer: BLUE CROSS/BLUE SHIELD

## 2015-06-06 DIAGNOSIS — B20 Human immunodeficiency virus [HIV] disease: Secondary | ICD-10-CM

## 2015-06-06 DIAGNOSIS — F4310 Post-traumatic stress disorder, unspecified: Secondary | ICD-10-CM

## 2015-06-06 DIAGNOSIS — F411 Generalized anxiety disorder: Secondary | ICD-10-CM
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
## 2015-06-07 LAB — T-HELPER CELL (CD4) - (RCID CLINIC ONLY)
CD4 T CELL HELPER: 26 % — AB (ref 33–55)
CD4 T Cell Abs: 1180 /uL (ref 400–2700)

## 2015-06-10 ENCOUNTER — Other Ambulatory Visit: Payer: Self-pay | Admitting: *Deleted

## 2015-06-10 DIAGNOSIS — B20 Human immunodeficiency virus [HIV] disease: Secondary | ICD-10-CM

## 2015-06-10 MED ORDER — DARUNAVIR-COBICISTAT 800-150 MG PO TABS: ORAL_TABLET | ORAL | Status: DC

## 2015-06-10 MED ORDER — ABACAVIR-DOLUTEGRAVIR-LAMIVUD 600-50-300 MG PO TABS
1.0000 | ORAL_TABLET | Freq: Every day | ORAL | Status: DC
Start: 2015-06-10 — End: 2015-07-04

## 2015-06-17 ENCOUNTER — Telehealth: Payer: Self-pay | Admitting: Internal Medicine

## 2015-06-17 NOTE — Telephone Encounter (Signed)
Returned patient's call, left a voicemail stating he believes his adap is expired and needs refills on meds. Once he returns call I will inform him of his options to apply for [facility] once he meets with [place].

## 2015-06-18 ENCOUNTER — Ambulatory Visit (INDEPENDENT_AMBULATORY_CARE_PROVIDER_SITE_OTHER): Payer: BLUE CROSS/BLUE SHIELD | Admitting: Internal Medicine

## 2015-06-18 ENCOUNTER — Encounter: Payer: Self-pay | Admitting: Internal Medicine

## 2015-06-18 VITALS — BP 143/91 | HR 74 | Temp 98.0°F | Ht 66.0 in | Wt 146.0 lb

## 2015-06-18 DIAGNOSIS — Z8619 Personal history of other infectious and parasitic diseases: Secondary | ICD-10-CM

## 2015-06-18 DIAGNOSIS — Z23 Encounter for immunization: Secondary | ICD-10-CM | POA: Diagnosis not present

## 2015-06-18 DIAGNOSIS — B20 Human immunodeficiency virus [HIV] disease: Secondary | ICD-10-CM

## 2015-06-18 DIAGNOSIS — F3131 Bipolar disorder, current episode depressed, mild: Secondary | ICD-10-CM

## 2015-06-18 NOTE — Progress Notes (Signed)
Patient Active Problem List   Diagnosis Date Noted  . HIV disease ([place]) 06/20/2014    Priority: High  . History of chlamydia 06/21/2014  . Bipolar disorder ([place]) 06/21/2014  . Anxiety 06/21/2014  . Cigarette smoker 06/21/2014  . History of syphilis 06/20/2014  . Dyslipidemia 06/20/2014    Patient's Medications  New Prescriptions   No medications on file  Previous Medications   ABACAVIR-DOLUTEGRAVIR-LAMIVUD 600-50-300 MG TABS    Take 1 tablet by mouth daily.   ARIPIPRAZOLE (ABILIFY) 20 MG TABLET    Take 1 tablet (20 mg total) by mouth daily.   DARUNAVIR-COBICISTAT (PREZCOBIX) 800-150 MG TABLET    TAKE 1 TABLET BY MOUTH DAILY WITH FOOD. SWALLOW WHOLE, DO NOT CRUSH, BREAK, OR CHEW TABLETS   VENLAFAXINE XR (EFFEXOR-XR) 75 MG 24 HR CAPSULE    Take 1 capsule (75 mg total) by mouth daily with breakfast.  Modified Medications   No medications on file  Discontinued Medications   DOXYCYCLINE (VIBRAMYCIN) 100 MG CAPSULE    Take 1 capsule (100 mg total) by mouth 2 (two) times daily.   IBUPROFEN (ADVIL,MOTRIN) 600 MG TABLET    Take 1 tablet (600 mg total) by mouth every 8 (eight) hours as needed.   METHOCARBAMOL (ROBAXIN) 500 MG TABLET    Take 1 tablet (500 mg total) by mouth every 6 (six) hours as needed for muscle spasms.    Subjective: Jeffery [name] is in for his routine HIV follow-up visit. He states that he was having difficulty getting to his pharmacy in August to pick up his medication because of his work schedule. He was off of his Triumeq and Prezcobix for one month then returned and had it refilled. He took it for 2 months and then was informed that his ADAP has expired. He has been off of his medication for the past 2 months. He is also not taking his psychotropic medications. He states that he remains in counseling but stopped his medications because he did not feel they were helping. He has been feeling a little bit down and depressed recently but this is unchanged.   Review  of Systems: Review of Systems  Constitutional: Negative for fever, chills, weight loss, malaise/fatigue and diaphoresis.  HENT: Negative for sore throat.   Respiratory: Negative for cough, sputum production and shortness of breath.   Cardiovascular: Negative for chest pain.  Gastrointestinal: Negative for nausea, vomiting and diarrhea.  Genitourinary: Negative for dysuria and frequency.  Musculoskeletal: Negative for myalgias and joint pain.  Skin: Negative for rash.  Neurological: Negative for focal weakness.  Psychiatric/Behavioral: Positive for depression. Negative for suicidal ideas and substance abuse. The patient is not nervous/anxious.     Past Medical History  Diagnosis Date  . HIV infection ([place])   . Hyperlipidemia   . Syphilis 2013 and 2015  . Anxiety   . Chlamydia   . Depression   . Hypertension     Pt went off meds on his own; has been monitored without any problems    Social History  Substance Use Topics  . Smoking status: Current Every Day Smoker -- 0.25 packs/day    Types: Cigarettes  . Smokeless tobacco: None  . Alcohol Use: 0.0 oz/week    0 Standard drinks or equivalent per week     Comment: occasional    Family History  Problem Relation Age of Onset  . Hypertension Mother     No Known Allergies  Objective:  Danley Danker  Vitals:   06/18/15 1023  BP: 143/91  Pulse: 74  Temp: 98 F (36.7 C)  TempSrc: Oral  Height: _0  (1.676 m)  Weight: 146 lb (66.225 kg)   Body mass index is 23.58 kg/(m^2).  Physical Exam  Constitutional: He is oriented to person, place, and time.  He is smiling and in good spirits.  Eyes: Conjunctivae are normal.  Cardiovascular: Normal rate and regular rhythm.   No murmur heard. Pulmonary/Chest: Breath sounds normal.  Abdominal: Soft. He exhibits no mass. There is no tenderness.  Musculoskeletal: Normal range of motion.  Neurological: He is alert and oriented to person, place, and time.  Skin: No rash noted.    Psychiatric: Mood and affect normal.    Lab Results Lab Results  Component Value Date   WBC 11.2* 08/02/2014   HGB 15.6 08/02/2014   HCT 46.3 08/02/2014   MCV 91.5 08/02/2014   PLT 310 08/02/2014    Lab Results  Component Value Date   CREATININE 0.88 08/02/2014   BUN 7 08/02/2014   NA 137 08/02/2014   K 4.1 08/02/2014   CL 103 08/02/2014   CO2 27 08/02/2014    Lab Results  Component Value Date   ALT 12 08/02/2014   AST 16 08/02/2014   ALKPHOS 73 08/02/2014   BILITOT 0.5 08/02/2014    Lab Results  Component Value Date   CHOL 224* 08/02/2014   HDL 34* 08/02/2014   LDLCALC 167* 08/02/2014   TRIG 113 08/02/2014   CHOLHDL 6.6 08/02/2014    Lab Results HIV 1 RNA QUANT (copies/mL)  Date Value  05/27/2015 3136*  08/02/2014 150*  06/07/2014 5378*   CD4 T CELL ABS (/uL)  Date Value  06/06/2015 1180  08/02/2014 750  06/07/2014 960      Problem List Items Addressed This Visit      High   HIV disease ([place])    His infection is not controlled because he has been off of his medications. I will check genotype and integrase resistance assays today. He will start the process of recertifying his ADAP today. I asked him to put reminders and his cell phone to let him know that he will need to recertify every January and July. He will see me back in one month I will try to get him restarted on his medication as soon as possible. He met with our pharmacist today and we will get him set up for a mail order pharmacy.      Relevant Orders   HIV-1 genotypr plus   HIV-1 Integrase Genotype     Unprioritized   Bipolar disorder ([place])    He does remain mildly depressed. I encouraged him to stay in counseling.      History of syphilis    He tells me that he has not been sexually active since his last visit. I talked him about the importance of careful partners collection and condom use in the future. He will have a repeat RPR at the time of his next visit in one month.        Other Visit Diagnoses    Need for prophylactic vaccination and inoculation against viral hepatitis    -  Primary    Relevant Orders    Hepatitis A vaccine adult IM (Completed)    Need for HPV vaccination        Relevant Orders    HPV vaccine quadrivalent 3 dose IM (Completed)         Latoria Dry  Megan Salon, [place] for [place] [phone] pager   [phone] cell 06/18/2015, 12:17 PM

## 2015-06-18 NOTE — Assessment & Plan Note (Addendum)
His infection is not controlled because he has been off of his medications. I will check genotype and integrase resistance assays today. He will start the process of recertifying his ADAP today. I asked him to put reminders and his cell phone to let him know that he will need to recertify every January and July. He will see me back in one month I will try to get him restarted on his medication as soon as possible. He met with our pharmacist today and we will get him set up for a mail order pharmacy.

## 2015-06-18 NOTE — Progress Notes (Signed)
Patient ID: Jeffery [name], male   DOB: [birth year], 24 y.o.   MRN: [id number] HPI: Jeffery [name] is a 24 y.o. male who is here for his HIV f/u visit.   Allergies: No Known Allergies  Vitals: Temp: 98 F (36.7 C) (12/13 1023) Temp Source: Oral (12/13 1023) BP: 143/91 mmHg (12/13 1023) Pulse Rate: 74 (12/13 1023)  Past Medical History: Past Medical History  Diagnosis Date  . HIV infection (HCC)   . Hyperlipidemia   . Syphilis 2013 and 2015  . Anxiety   . Chlamydia   . Depression   . Hypertension     Pt went off meds on his own; has been monitored without any problems    Social History: Social History   Social History  . Marital Status: Single    Spouse Name: N/A  . Number of Children: N/A  . Years of Education: N/A   Social History Main Topics  . Smoking status: Current Every Day Smoker -- 0.25 packs/day    Types: Cigarettes  . Smokeless tobacco: None  . Alcohol Use: 0.0 oz/week    0 Standard drinks or equivalent per week     Comment: occasional  . Drug Use: 7.00 per week    Special: Marijuana     Comment: Documented hx marijuana  . Sexual Activity: Not Asked     Comment: given condoms   Other Topics Concern  . None   Social History Narrative    Previous Regimen: ETR + TRV  Current Regimen: Supposed to be on Triumeq + Prez  Labs: HIV 1 RNA QUANT (copies/mL)  Date Value  05/27/2015 3136*  08/02/2014 150*  06/07/2014 5378*   CD4 T CELL ABS (/uL)  Date Value  06/06/2015 1180  08/02/2014 750  06/07/2014 960   HEP B S AB (no units)  Date Value  06/07/2014 POS*   HEPATITIS B SURFACE AG (no units)  Date Value  06/07/2014 NEGATIVE   HCV AB (no units)  Date Value  06/07/2014 NEGATIVE    CrCl: CrCl cannot be calculated (Patient has no serum creatinine result on file.).  Lipids:    Component Value Date/Time   CHOL 224* 08/02/2014 1014   TRIG 113 08/02/2014 1014   HDL 34* 08/02/2014 1014   CHOLHDL 6.6 08/02/2014 1014   VLDL 23  08/02/2014 1014   LDLCALC 167* 08/02/2014 1014   HIV Genotype Composite Data Genotype Dates:   Mutations in Bold impact drug susceptibility RT Mutations M184I, K219R  PI Mutations   Integrase Mutations    Interpretation of Genotype Data per [employer] HIV Database Nucleoside RTIs  abacavir (ABC) Low-Level Resistance zidovudine (AZT) Susceptible stavudine (D4T) Susceptible didanosine (DDI) Low-Level Resistance emtricitabine (FTC) High-Level Resistance lamivudine (3TC) High-Level Resistance tenofovir (TDF) Susceptible   Non-Nucleoside RTIs     Protease Inhibitors     Integrase Inhibitors      Assessment: Jeffery [name] was dx back in 2010 for his HIV. He received care in [place] at the time. He was in the ETR/TRV study at the time. Apparently, he may have some ETR resistance according to notes. Therefore, he is on Triumeq + Prez. Refer to the limited resistance table above. He prob has more resistance. His compliance is not great due to various reason with the most recent due to ADAP lapse. I warned him several time about staying on top of the renewing process. Warned him about his resistance issue currently. Dr Orvan Falconer is going to do more testing today. He is off of ART.  Recommendations:  F/u with resistance testing to craft another possible regimen  Clide CliffPham, Kingsten Enfield Quang, PharmD Clinical Infectious Disease Pharmacist [facility] 06/18/2015, 11:04 AM

## 2015-06-18 NOTE — Assessment & Plan Note (Signed)
He tells me that he has not been sexually active since his last visit. I talked him about the importance of careful partners collection and condom use in the future. He will have a repeat RPR at the time of his next visit in one month.

## 2015-06-18 NOTE — Assessment & Plan Note (Signed)
He does remain mildly depressed. I encouraged him to stay in counseling.

## 2015-06-20 ENCOUNTER — Ambulatory Visit: Payer: BLUE CROSS/BLUE SHIELD

## 2015-06-20 DIAGNOSIS — F411 Generalized anxiety disorder: Secondary | ICD-10-CM

## 2015-06-20 NOTE — BH Specialist Note (Signed)
Jeffery [name] reporteKathlene Novemberd that he quit his job after an incident where a coworker asked him to write their notes for them and it was found out by staff.  He wasn't fired for this, but started noticing that some staff were starting to put added pressure on him.  He says his mood is 6 on a 10 pt scale, but he is sleeping more than usual.  His anxiety continues to be a daily thing.  We completed a treatment plan to reduce anxiety and maintain good mood.  I gave him some psycho-education on cognitive behavioral therapy.  Plan to meet again in one week. Franne FortsKenny Fran Neiswonger, LCSW

## 2015-06-26 LAB — HIV-1 INTEGRASE GENOTYPE

## 2015-06-27 ENCOUNTER — Ambulatory Visit: Payer: BLUE CROSS/BLUE SHIELD

## 2015-06-27 DIAGNOSIS — F331 Major depressive disorder, recurrent, moderate: Secondary | ICD-10-CM

## 2015-06-27 LAB — HIV-1 GENOTYPR PLUS

## 2015-06-27 NOTE — BH Specialist Note (Signed)
[name] said he is spending more time at home and not getting out to do things.  I talked to him about exercising

## 2015-07-04 ENCOUNTER — Other Ambulatory Visit: Payer: Self-pay | Admitting: *Deleted

## 2015-07-04 DIAGNOSIS — B20 Human immunodeficiency virus [HIV] disease: Secondary | ICD-10-CM

## 2015-07-04 MED ORDER — ABACAVIR-DOLUTEGRAVIR-LAMIVUD 600-50-300 MG PO TABS: 1.0000 | ORAL_TABLET | Freq: Every day | ORAL | Status: DC

## 2015-07-04 MED ORDER — DARUNAVIR-COBICISTAT 800-150 MG PO TABS: ORAL_TABLET | ORAL | Status: DC

## 2015-07-16 ENCOUNTER — Ambulatory Visit: Payer: BLUE CROSS/BLUE SHIELD

## 2015-07-16 DIAGNOSIS — F411 Generalized anxiety disorder: Secondary | ICD-10-CM

## 2015-07-16 NOTE — BH Specialist Note (Signed)
Jeffery [name] talked about how he "almost" got a job - getting a second interview, but then, didn't get the job.  He said he has not been sleeping well lately, getting about 5 hours of sleep.  I recommended melatonin and he agreed to give it a try.  I also provided psycho-education on mindfulness and facilitated a guided meditation and he agreed to give this a try. He also talked about feelings he has had since he was young that he should have been a male.  He said he puts on a male image but it is to appease everyone else and not himself.  We processed this some and agreed to continue to talk about it some.  Plan to meet again in 2 weeks. Franne FortsKenny Kelseigh Diver, LCSW

## 2015-07-23 ENCOUNTER — Ambulatory Visit (INDEPENDENT_AMBULATORY_CARE_PROVIDER_SITE_OTHER): Payer: Self-pay | Admitting: Internal Medicine

## 2015-07-23 ENCOUNTER — Encounter: Payer: Self-pay | Admitting: Internal Medicine

## 2015-07-23 VITALS — BP 135/87 | HR 81 | Temp 98.1°F | Wt 138.5 lb

## 2015-07-23 DIAGNOSIS — Z113 Encounter for screening for infections with a predominantly sexual mode of transmission: Secondary | ICD-10-CM

## 2015-07-23 DIAGNOSIS — F3131 Bipolar disorder, current episode depressed, mild: Secondary | ICD-10-CM

## 2015-07-23 DIAGNOSIS — Z23 Encounter for immunization: Secondary | ICD-10-CM

## 2015-07-23 DIAGNOSIS — Z8619 Personal history of other infectious and parasitic diseases: Secondary | ICD-10-CM

## 2015-07-23 DIAGNOSIS — B20 Human immunodeficiency virus [HIV] disease: Secondary | ICD-10-CM

## 2015-07-23 MED ORDER — ABACAVIR-DOLUTEGRAVIR-LAMIVUD 600-50-300 MG PO TABS: 1.0000 | ORAL_TABLET | Freq: Every day | ORAL | Status: DC

## 2015-07-23 MED ORDER — DARUNAVIR-COBICISTAT 800-150 MG PO TABS: ORAL_TABLET | ORAL | Status: DC

## 2015-07-23 MED ORDER — DARUNAVIR-COBICISTAT 800-150 MG PO TABS
ORAL_TABLET | ORAL | Status: DC
Start: 2015-07-23 — End: 2016-03-02

## 2015-07-23 NOTE — Progress Notes (Signed)
Patient Active Problem List   Diagnosis Date Noted  . HIV disease (HCC) 06/20/2014    Priority: High  . History of chlamydia 06/21/2014  . Bipolar disorder (HCC) 06/21/2014  . Anxiety 06/21/2014  . Cigarette smoker 06/21/2014  . History of syphilis 06/20/2014  . Dyslipidemia 06/20/2014    Patient's Medications  New Prescriptions   No medications on file  Previous Medications   ARIPIPRAZOLE (ABILIFY) 20 MG TABLET    Take 1 tablet (20 mg total) by mouth daily.   VENLAFAXINE XR (EFFEXOR-XR) 75 MG 24 HR CAPSULE    Take 1 capsule (75 mg total) by mouth daily with breakfast.  Modified Medications   Modified Medication Previous Medication   ABACAVIR-DOLUTEGRAVIR-LAMIVUDINE (TRIUMEQ) 600-50-300 MG TABLET abacavir-dolutegravir-lamiVUDine (TRIUMEQ) 600-50-300 MG tablet      Take 1 tablet by mouth daily.    Take 1 tablet by mouth daily.   DARUNAVIR-COBICISTAT (PREZCOBIX) 800-150 MG TABLET darunavir-cobicistat (PREZCOBIX) 800-150 MG tablet      TAKE 1 TABLET BY MOUTH DAILY WITH FOOD. SWALLOW WHOLE, DO NOT CRUSH, BREAK, OR CHEW TABLETS    TAKE 1 TABLET BY MOUTH DAILY WITH FOOD. SWALLOW WHOLE, DO NOT CRUSH, BREAK, OR CHEW TABLETS  Discontinued Medications   No medications on file    Subjective: Jeffery [name] is in for his routine HIV follow-up. He reapplied for ADAP a little over one month ago but it is still not been approved yet. He remains off of his Triumeq and Prezcobix. He states that he is feeling less depressed but still feels he wants to talk with her counselor, Jeffery [name], today. He is still smoking cigarettes, between 5 and 14 daily. He has no current plan to quit.  Review of Systems: Review of Systems  Constitutional: Negative for fever, chills, weight loss, malaise/fatigue and diaphoresis.  HENT: Negative for sore throat.   Respiratory: Negative for cough, sputum production and shortness of breath.   Cardiovascular: Negative for chest pain.  Gastrointestinal: Negative  for nausea, vomiting and diarrhea.  Genitourinary: Negative for dysuria.  Musculoskeletal: Negative for joint pain.  Skin: Positive for rash.       He has noted some new dark lines on his nailbeds.  Psychiatric/Behavioral: Positive for depression. Negative for substance abuse. The patient is not nervous/anxious.     Past Medical History  Diagnosis Date  . HIV infection (HCC)   . Hyperlipidemia   . Syphilis 2013 and 2015  . Anxiety   . Chlamydia   . Depression   . Hypertension     Pt went off meds on his own; has been monitored without any problems    Social History  Substance Use Topics  . Smoking status: Current Every Day Smoker -- 0.50 packs/day    Types: Cigarettes  . Smokeless tobacco: None  . Alcohol Use: 0.0 oz/week    0 Standard drinks or equivalent per week     Comment: occasional    Family History  Problem Relation Age of Onset  . Hypertension Mother     No Known Allergies  Objective:  Filed Vitals:   07/23/15 1341  BP: 135/87  Pulse: 81  Temp: 98.1 F (36.7 C)  TempSrc: Oral  Weight: 138 lb 8 oz (62.823 kg)   Body mass index is 22.37 kg/(m^2).  Physical Exam  Constitutional: He is oriented to person, place, and time. No distress.  He is smiling and in good spirits.  HENT:  Mouth/Throat: No oropharyngeal exudate.  Eyes: Conjunctivae are normal.  Cardiovascular: Normal rate and regular rhythm.   No murmur heard. Pulmonary/Chest: Breath sounds normal.  Abdominal: Soft. He exhibits no mass. There is no tenderness.  Musculoskeletal: Normal range of motion.  Lymphadenopathy:       Right: Inguinal adenopathy present.       Left: Inguinal adenopathy present.  Neurological: He is alert and oriented to person, place, and time.  Skin: No rash noted.  He has tiny follicle based papules on his posterior neck. There are no erythematous lesions or pustules present. He does have a few fingernails where there are dark lines running from the nailbed out to  the tip of the nail. He has 2 slightly callused areas on the dorsum of his left foot. They are not painful. These developed after he was doing some dance moves that caused the top of his foot to rub on the dance floor.  Psychiatric: Mood and affect normal.    Lab Results Lab Results  Component Value Date   WBC 11.2* 08/02/2014   HGB 15.6 08/02/2014   HCT 46.3 08/02/2014   MCV 91.5 08/02/2014   PLT 310 08/02/2014    Lab Results  Component Value Date   CREATININE 0.88 08/02/2014   BUN 7 08/02/2014   NA 137 08/02/2014   K 4.1 08/02/2014   CL 103 08/02/2014   CO2 27 08/02/2014    Lab Results  Component Value Date   ALT 12 08/02/2014   AST 16 08/02/2014   ALKPHOS 73 08/02/2014   BILITOT 0.5 08/02/2014    Lab Results  Component Value Date   CHOL 224* 08/02/2014   HDL 34* 08/02/2014   LDLCALC 167* 08/02/2014   TRIG 113 08/02/2014   CHOLHDL 6.6 08/02/2014    Lab Results HIV 1 RNA QUANT (copies/mL)  Date Value  05/27/2015 3136*  08/02/2014 150*  06/07/2014 5378*   CD4 T CELL ABS (/uL)  Date Value  06/06/2015 1180  08/02/2014 750  06/07/2014 960      Problem List Items Addressed This Visit      High   HIV disease (HCC) - Primary    We will try to get him back on Triumeq and Prezcobix using [facility] as soon as possible until his ADAP can be recertified. I will recheck his lab work today and see him back after lab work in 6 months.      Relevant Medications   abacavir-dolutegravir-lamiVUDine (TRIUMEQ) 600-50-300 MG tablet   darunavir-cobicistat (PREZCOBIX) 800-150 MG tablet   Other Relevant Orders   T-helper cell (CD4)- ([facility] only)   HIV 1 RNA quant-no reflex-bld   RPR   T-helper cell (CD4)- ([facility] clinic only)   HIV 1 RNA quant-no reflex-bld   CBC   Comprehensive metabolic panel   Lipid panel   RPR     Unprioritized   Bipolar disorder (HCC)    His depression has improved but I encouraged him to stay in counseling. He will see Jeffery [name]  today. He has noticed that when he focuses on staying healthy he has more positive and seems to do better. When he gets a job or starch to focus on other things he tends to let his health suffer. We talked about making sure that he kept his priorities such that he always took care of himself.      History of syphilis    He recently completed therapy for syphilis. His last RPR was coming down. I will repeat it today.  I talked to him again about the importance of limiting partners, partner selection and condom use.       Other Visit Diagnoses    Need for HPV vaccination        Relevant Orders    HPV vaccine quadrivalent 3 dose IM (Completed)         Cliffton Asters, MD [facility] [phone] pager   [phone] cell 07/23/2015, 2:07 PM

## 2015-07-23 NOTE — Assessment & Plan Note (Signed)
We will try to get him back on Triumeq and Prezcobix using [facility] as soon as possible until his ADAP can be recertified. I will recheck his lab work today and see him back after lab work in 6 months.

## 2015-07-23 NOTE — Addendum Note (Signed)
Addended by: Jennet Maduro D on: 07/23/2015 02:18 PM   Modules accepted: Orders

## 2015-07-23 NOTE — Assessment & Plan Note (Signed)
His depression has improved but I encouraged him to stay in counseling. He will see Franne Forts today. He has noticed that when he focuses on staying healthy he has more positive and seems to do better. When he gets a job or starch to focus on other things he tends to let his health suffer. We talked about making sure that he kept his priorities such that he always took care of himself.

## 2015-07-23 NOTE — Assessment & Plan Note (Signed)
He recently completed therapy for syphilis. His last RPR was coming down. I will repeat it today. I talked to him again about the importance of limiting partners, partner selection and condom use.

## 2015-07-24 ENCOUNTER — Ambulatory Visit: Payer: Self-pay

## 2015-07-24 DIAGNOSIS — F4310 Post-traumatic stress disorder, unspecified: Secondary | ICD-10-CM

## 2015-07-24 LAB — RPR TITER: RPR Titer: 1:8 {titer}

## 2015-07-24 LAB — T-HELPER CELL (CD4) - (RCID CLINIC ONLY)
CD4 % Helper T Cell: 22 % — ABNORMAL LOW (ref 33–55)
CD4 T CELL ABS: 750 /uL (ref 400–2700)

## 2015-07-24 LAB — FLUORESCENT TREPONEMAL AB(FTA)-IGG-BLD: Fluorescent Treponemal ABS: REACTIVE — AB

## 2015-07-24 LAB — RPR: RPR Ser Ql: REACTIVE — AB

## 2015-07-24 NOTE — BH Specialist Note (Signed)
Jeffery [name] was in a fairly good mood today and didn't know where to start, since he had met briefly with me yesterday.  I chose to talk to him about his history of trauma and how much symptoms of this affect him.  I provided more psycho-education on EMDR and facilitated 2 guided imagery exercises - "container" and "safe place".  He participated well with them, but had trouble allowing himself to relax and forget about the "container" while he was in his "safe place". Plan to meet again in one week. [name], LCSW

## 2015-07-26 LAB — HIV-1 RNA QUANT-NO REFLEX-BLD
HIV 1 RNA Quant: 5861 copies/mL — ABNORMAL HIGH (ref <20)
HIV-1 RNA Quant, Log: 3.77 Log copies/mL — ABNORMAL HIGH (ref <1.30)

## 2015-07-29 ENCOUNTER — Ambulatory Visit: Payer: BLUE CROSS/BLUE SHIELD

## 2015-07-30 ENCOUNTER — Ambulatory Visit: Payer: Self-pay

## 2015-07-30 DIAGNOSIS — F411 Generalized anxiety disorder: Secondary | ICD-10-CM

## 2015-07-30 NOTE — BH Specialist Note (Signed)
Jeffery [name] reported that he had a pretty good birthday, largely because he didn't expect anything from it and was able to enjoy the day, hanging out with his mother and watching movies. He talked some about his frustration with himself for not moving forward.  We talked about his goals in life and he initially talked about finishing his degree in psychology and how he came to the decision to major in that.  But then, I suggested he write down what his ideal life would be like and he talked about interior design and culinary school.  I recommended the book, "the Secret" to him.  We plan to continue this discussion next session.   Franne Forts, LCSW

## 2015-08-06 ENCOUNTER — Ambulatory Visit: Payer: BLUE CROSS/BLUE SHIELD

## 2015-08-06 ENCOUNTER — Ambulatory Visit: Payer: Self-pay

## 2015-08-06 DIAGNOSIS — F4310 Post-traumatic stress disorder, unspecified: Secondary | ICD-10-CM

## 2015-08-06 NOTE — BH Specialist Note (Signed)
Jeffery [name] reported that he had been planning to go with his mother to [place], [place], where his brother lives and where he had a job interview, but decided that he wanted to stay here in [place] and continue therapy and work on his issues.  He said he is realizing that he has "done people wrong" at times in the past and is trying to make amends, apologizing to people for his actions.  I commended him for this.  He decided he wanted to write this as a goal in his treatment plan, so we did so, and signed it.  I also talked to him about the benefits of journaling and gave him several options, including "audio journaling", which he said he liked. Plan to meet again in one week. Franne Forts, LCSW

## 2015-08-12 ENCOUNTER — Telehealth: Payer: Self-pay | Admitting: *Deleted

## 2015-08-12 NOTE — Telephone Encounter (Signed)
Wanting to begin transition.  Wanting to start taking hormones.  Requesting appt to discuss with Dr. Orvan Falconer.

## 2015-08-13 ENCOUNTER — Ambulatory Visit: Payer: Self-pay

## 2015-08-13 DIAGNOSIS — F4310 Post-traumatic stress disorder, unspecified: Secondary | ICD-10-CM

## 2015-08-13 NOTE — BH Specialist Note (Signed)
Draylon was in a good mood today, following up on our phone conversation earlier about his recent decision to begin transitioning to a male.  He said that once he made this decision, he felt a sense of freedom.  He has talked to several friends and family about it, with mixed results.  Some friends have said they are supportive of this, while others have said they cannot support it.  He said he is okay with saying goodbye to friends who do not support it.  He said his mother said "that is between you and God, but I will still love you".  We discussed several aspects of this - growing his hair longer, the fact that he has shaved his beard, etc.  He rated his mood at "8 or 9" on a 10 pt scale and his anxiety at a 2 or 3.  He also said he has applied to [employer] for online classes and hopes to take 2 classes if they approve it.  I praised him for this and also gave him positive feedback on all the things he is doing.  I told him it seems he is going into this very thoughtfully.  Plan to meet in one week. Franne Forts, LCSW

## 2015-08-20 ENCOUNTER — Ambulatory Visit: Payer: Self-pay

## 2015-08-20 DIAGNOSIS — F4310 Post-traumatic stress disorder, unspecified: Secondary | ICD-10-CM

## 2015-08-20 NOTE — BH Specialist Note (Signed)
Jeffery [name] was in a good mood today, reporting that he is still anxiously awaiting his appointment with Dr. Orvan Falconer next week, to talk to him about starting hormone therapy and begin his transition.  He said he has been reducing his smoking to 4 or less cigarettes a day (he was informed he can't smoke while on the hormones).  I talked with him about quitting and gave him a toll free number set up by the state of [place] to help people trying to quit smoking (Quitline). I also explained to him how our Peer Support Specialist, Lesle Chris, will be able to connect him to some support in the community with transgender patients. He was appreciative of this. Plan to meet again in one week. Franne Forts, LCSW

## 2015-08-27 ENCOUNTER — Ambulatory Visit: Payer: Self-pay | Admitting: Internal Medicine

## 2015-08-27 ENCOUNTER — Ambulatory Visit: Payer: Self-pay

## 2015-09-03 ENCOUNTER — Ambulatory Visit: Payer: Self-pay

## 2015-09-03 DIAGNOSIS — F4310 Post-traumatic stress disorder, unspecified: Secondary | ICD-10-CM

## 2015-09-03 NOTE — BH Specialist Note (Signed)
Jeffery [name] was in a fairly good mood today, reporting that he is trying to learn patience, because he is working on getting a job and waiting to see the doctor to start hormones and begin his transition.  He said he has written down a plan for the next year and I praised him for doing this because it will help him in this whole process. He has applied for an apartment in the apartment complex where he is currently living with his mother.  He said it is a nice place and it will be easier to transition if he has his own place.  Plan to meet again in one week. Franne Forts, LCSW

## 2015-09-10 ENCOUNTER — Ambulatory Visit: Payer: Self-pay

## 2015-09-10 DIAGNOSIS — F4310 Post-traumatic stress disorder, unspecified: Secondary | ICD-10-CM

## 2015-09-10 NOTE — BH Specialist Note (Signed)
Jeffery [name] came in today with a depressed affect and low tone in his voice, reporting that his mother and he had an argument last night about his transition.  He said she noticed that he was shaving his beard and they began talking about his desire to transition to a male.  He said she ended up calling it "an abomination".  I asked about his functioning and he said he was sleeping and eating okay. I provided some support and validated his feelings.  I then brought our Peer Support Specialist, Lesle ChrisWes Taylor, in and discussed facilitating connecting Jeffery [name] with a transgender patient here, who has already acknowledged interest in being a support to Jeffery [name].  By the end of our session, Wes had made contact with her and texted her phone number to Jeffery [name].  He was able to smile before the session ended, saying he was going to text her after the session.  Plan to meet again in one week. Franne FortsKenny Kacper Cartlidge, LCSW

## 2015-09-17 ENCOUNTER — Ambulatory Visit: Payer: Self-pay

## 2015-09-23 ENCOUNTER — Ambulatory Visit (INDEPENDENT_AMBULATORY_CARE_PROVIDER_SITE_OTHER): Payer: Self-pay | Admitting: Internal Medicine

## 2015-09-23 ENCOUNTER — Encounter: Payer: Self-pay | Admitting: Internal Medicine

## 2015-09-23 ENCOUNTER — Ambulatory Visit: Payer: Self-pay

## 2015-09-23 VITALS — BP 144/90 | HR 73 | Temp 98.8°F | Ht 66.5 in | Wt 137.0 lb

## 2015-09-23 DIAGNOSIS — F17210 Nicotine dependence, cigarettes, uncomplicated: Secondary | ICD-10-CM

## 2015-09-23 DIAGNOSIS — Z113 Encounter for screening for infections with a predominantly sexual mode of transmission: Secondary | ICD-10-CM

## 2015-09-23 DIAGNOSIS — B20 Human immunodeficiency virus [HIV] disease: Secondary | ICD-10-CM

## 2015-09-23 DIAGNOSIS — Z789 Other specified health status: Secondary | ICD-10-CM | POA: Insufficient documentation

## 2015-09-23 DIAGNOSIS — Z79899 Other long term (current) drug therapy: Secondary | ICD-10-CM

## 2015-09-23 DIAGNOSIS — Z720 Tobacco use: Secondary | ICD-10-CM

## 2015-09-23 DIAGNOSIS — F640 Transsexualism: Secondary | ICD-10-CM

## 2015-09-23 LAB — COMPREHENSIVE METABOLIC PANEL
ALT: 11 U/L (ref 9–46)
AST: 14 U/L (ref 10–40)
Albumin: 4.2 g/dL (ref 3.6–5.1)
Alkaline Phosphatase: 64 U/L (ref 40–115)
BUN: 10 mg/dL (ref 7–25)
CALCIUM: 10.2 mg/dL (ref 8.6–10.3)
CHLORIDE: 101 mmol/L (ref 98–110)
CO2: 29 mmol/L (ref 20–31)
Creat: 1.04 mg/dL (ref 0.60–1.35)
GLUCOSE: 77 mg/dL (ref 65–99)
POTASSIUM: 4.1 mmol/L (ref 3.5–5.3)
Sodium: 139 mmol/L (ref 135–146)
Total Bilirubin: 0.5 mg/dL (ref 0.2–1.2)
Total Protein: 7.1 g/dL (ref 6.1–8.1)

## 2015-09-23 LAB — CBC
HCT: 45.3 % (ref 39.0–52.0)
Hemoglobin: 15.3 g/dL (ref 13.0–17.0)
MCH: 31 pg (ref 26.0–34.0)
MCHC: 33.8 g/dL (ref 30.0–36.0)
MCV: 91.9 fL (ref 78.0–100.0)
MPV: 9.9 fL (ref 8.6–12.4)
PLATELETS: 353 10*3/uL (ref 150–400)
RBC: 4.93 MIL/uL (ref 4.22–5.81)
RDW: 15.3 % (ref 11.5–15.5)
WBC: 10 10*3/uL (ref 4.0–10.5)

## 2015-09-23 LAB — LIPID PANEL
CHOL/HDL RATIO: 6.5 ratio — AB (ref <=5.0)
Cholesterol: 258 mg/dL — ABNORMAL HIGH (ref 125–200)
HDL: 40 mg/dL (ref >=40)
LDL CALC: 186 mg/dL — AB (ref <130)
Triglycerides: 162 mg/dL — ABNORMAL HIGH (ref <150)
VLDL: 32 mg/dL — ABNORMAL HIGH (ref <30)

## 2015-09-23 NOTE — Assessment & Plan Note (Signed)
She is off to a good start with antiretroviral therapy and it sounds like her adherence is excellent. I will continue Triumeq and Prezcobix and repeat lab work today. She will follow-up in 2 weeks.

## 2015-09-23 NOTE — Assessment & Plan Note (Signed)
I encouraged her to set another quit date and make a strong attempt to quit before she starts estrogen therapy.

## 2015-09-23 NOTE — Assessment & Plan Note (Signed)
She states that she is ready to start gender reassignment therapy. She has been in counseling on a regular basis. I talked her about starting estrogen and anti-androgen therapies. Unfortunately, she cannot afford any out-of-pocket expense right now and I do not know of any programs that would pay for hormone therapy for her. I would also like her to stop smoking cigarettes before she starts estrogens.

## 2015-09-23 NOTE — Assessment & Plan Note (Addendum)
Her mood is brighter today. I encouraged her to stay in therapy with our behavioral health counselor, Franne FortsKenny Shore.

## 2015-09-23 NOTE — Progress Notes (Signed)
Patient Active Problem List   Diagnosis Date Noted  . HIV disease (HCC) 06/20/2014    Priority: High  . Gender identity disorder in adult 09/23/2015  . History of chlamydia 06/21/2014  . Bipolar disorder (HCC) 06/21/2014  . Anxiety 06/21/2014  . Cigarette smoker 06/21/2014  . History of syphilis 06/20/2014  . Dyslipidemia 06/20/2014    Patient's Medications  New Prescriptions   No medications on file  Previous Medications   ABACAVIR-DOLUTEGRAVIR-LAMIVUDINE (TRIUMEQ) 600-50-300 MG TABLET    Take 1 tablet by mouth daily.   DARUNAVIR-COBICISTAT (PREZCOBIX) 800-150 MG TABLET    TAKE 1 TABLET BY MOUTH DAILY WITH FOOD. SWALLOW WHOLE, DO NOT CRUSH, BREAK, OR CHEW TABLETS  Modified Medications   No medications on file  Discontinued Medications   ARIPIPRAZOLE (ABILIFY) 20 MG TABLET    Take 1 tablet (20 mg total) by mouth daily.   VENLAFAXINE XR (EFFEXOR-XR) 75 MG 24 HR CAPSULE    Take 1 capsule (75 mg total) by mouth daily with breakfast.    Subjective: Jeffery [name]([name] is ready to begin sexual reassignment therapy and now prefers to go by the name, Jeffery) is in for her routine HIV follow-up visit. She was able to start on Triumeq and Prezcobix shortly after her last visit in January. She takes it about 1 PM each day. She has had no side effects. She missed 3 days in a row while waiting on her first refill. She believes her ADAP has been approved. She is hoping to quit smoking cigarettes completely. She is down to 3 cigarettes a day. She did set a quit date but did not try to quit when it came. She states that her mood is somewhat better. She is still looking for work.   Review of Systems: Review of Systems  Constitutional: Negative for fever, chills, weight loss, malaise/fatigue and diaphoresis.  HENT: Negative for sore throat.   Respiratory: Negative for cough, sputum production and shortness of breath.   Cardiovascular: Negative for chest pain.  Gastrointestinal: Negative for  nausea, vomiting and diarrhea.  Genitourinary: Negative for dysuria and frequency.  Musculoskeletal: Negative for myalgias and joint pain.  Skin: Negative for rash.  Neurological: Negative for dizziness and headaches.  Psychiatric/Behavioral: Positive for depression. Negative for substance abuse. The patient is not nervous/anxious.     Past Medical History  Diagnosis Date  . HIV infection (HCC)   . Hyperlipidemia   . Syphilis 2013 and 2015  . Anxiety   . Chlamydia   . Depression   . Hypertension     Pt went off meds on his own; has been monitored without any problems    Social History  Substance Use Topics  . Smoking status: Light Tobacco Smoker -- 0.10 packs/day    Types: Cigarettes  . Smokeless tobacco: None     Comment: 3 cigarettes a day  . Alcohol Use: 0.0 oz/week    0 Standard drinks or equivalent per week     Comment: occasional    Family History  Problem Relation Age of Onset  . Hypertension Mother     No Known Allergies  Objective:  Filed Vitals:   09/23/15 1420  BP: 144/90  Pulse: 73  Temp: 98.8 F (37.1 C)  TempSrc: Oral  Height: 5' 6.5" (1.689 m)  Weight: 137 lb (62.143 kg)   Body mass index is 21.78 kg/(m^2).  Physical Exam  Constitutional: He is oriented to person, place, and time.  She  is smiling and in good spirits.  HENT:  Mouth/Throat: No oropharyngeal exudate.  Eyes: Conjunctivae are normal.  Cardiovascular: Normal rate and regular rhythm.   No murmur heard. Pulmonary/Chest: Breath sounds normal.  Abdominal: Soft. He exhibits no mass. There is no tenderness.  Musculoskeletal: Normal range of motion.  Neurological: He is alert and oriented to person, place, and time.  Skin: No rash noted.  Psychiatric: Mood and affect normal.    Lab Results Lab Results  Component Value Date   WBC 11.2* 08/02/2014   HGB 15.6 08/02/2014   HCT 46.3 08/02/2014   MCV 91.5 08/02/2014   PLT 310 08/02/2014    Lab Results  Component Value Date    CREATININE 0.88 08/02/2014   BUN 7 08/02/2014   NA 137 08/02/2014   K 4.1 08/02/2014   CL 103 08/02/2014   CO2 27 08/02/2014    Lab Results  Component Value Date   ALT 12 08/02/2014   AST 16 08/02/2014   ALKPHOS 73 08/02/2014   BILITOT 0.5 08/02/2014    Lab Results  Component Value Date   CHOL 224* 08/02/2014   HDL 34* 08/02/2014   LDLCALC 167* 08/02/2014   TRIG 113 08/02/2014   CHOLHDL 6.6 08/02/2014    Lab Results HIV 1 RNA QUANT (copies/mL)  Date Value  07/23/2015 5861*  05/27/2015 3136*  08/02/2014 150*   CD4 T CELL ABS (/uL)  Date Value  07/23/2015 750  06/06/2015 1180  08/02/2014 750      Problem List Items Addressed This Visit      High   HIV disease (HCC)    She is off to a good start with antiretroviral therapy and it sounds like her adherence is excellent. I will continue Triumeq and Prezcobix and repeat lab work today. She will follow-up in 2 weeks.      Relevant Orders   T-helper cell (CD4)- ([facility] clinic only)   HIV 1 RNA quant-no reflex-bld   RPR   CBC   Comprehensive metabolic panel   Lipid panel     Unprioritized   Cigarette smoker    I encouraged her to set another quit date and make a strong attempt to quit before she starts estrogen therapy.      Gender identity disorder in adult - Primary    She states that she is ready to start gender reassignment therapy. She has been in counseling on a regular basis. I talked her about starting estrogen and anti-androgen therapies. Unfortunately, she cannot afford any out-of-pocket expense right now and I do not know of any programs that would pay for hormone therapy for her. I would also like her to stop smoking cigarettes before she starts estrogens.           Cliffton Asters, MD [facility] [phone] pager   [phone] cell 09/23/2015, 4:15 PM

## 2015-09-23 NOTE — BH Specialist Note (Signed)
Jeffery [name] reports that things are going well for him right now, in spite of the fact that several things in his life are in limbo. He met with Dr. Megan Salon today and cannot start hormone therapy until he stops smoking.  He said he is down to 3 cigarettes a day and hopes to stop completely within 3 weeks. He also is applying for jobs but hasn't heard back from anyone lately.  He reports good mood, good sleep, and no appetite issues. He did say he is enjoying spending some time with our Transport planner, Pamelia Center.  Plan to meet again in 3 weeks.

## 2015-09-24 LAB — RPR: RPR Ser Ql: REACTIVE — AB

## 2015-09-24 LAB — FLUORESCENT TREPONEMAL AB(FTA)-IGG-BLD: FLUORESCENT TREPONEMAL ABS: REACTIVE — AB

## 2015-09-24 LAB — T-HELPER CELL (CD4) - (RCID CLINIC ONLY)
CD4 % Helper T Cell: 27 % — ABNORMAL LOW (ref 33–55)
CD4 T Cell Abs: 1040 /uL (ref 400–2700)

## 2015-09-24 LAB — RPR TITER: RPR Titer: 1:8 {titer}

## 2015-09-26 LAB — HIV-1 RNA QUANT-NO REFLEX-BLD
HIV 1 RNA Quant: <20 copies/mL (ref <20)
HIV-1 RNA Quant, Log: <1.3 Log copies/mL (ref <1.30)

## 2015-10-14 ENCOUNTER — Ambulatory Visit: Payer: Self-pay

## 2016-02-18 ENCOUNTER — Encounter: Payer: Self-pay | Admitting: Internal Medicine

## 2016-02-18 ENCOUNTER — Ambulatory Visit (INDEPENDENT_AMBULATORY_CARE_PROVIDER_SITE_OTHER): Payer: Self-pay | Admitting: Internal Medicine

## 2016-02-18 DIAGNOSIS — F640 Transsexualism: Secondary | ICD-10-CM

## 2016-02-18 DIAGNOSIS — F3132 Bipolar disorder, current episode depressed, moderate: Secondary | ICD-10-CM

## 2016-02-18 DIAGNOSIS — B20 Human immunodeficiency virus [HIV] disease: Secondary | ICD-10-CM

## 2016-02-18 DIAGNOSIS — F17210 Nicotine dependence, cigarettes, uncomplicated: Secondary | ICD-10-CM

## 2016-02-18 DIAGNOSIS — Z789 Other specified health status: Secondary | ICD-10-CM

## 2016-02-18 DIAGNOSIS — Z720 Tobacco use: Secondary | ICD-10-CM

## 2016-02-18 NOTE — Addendum Note (Signed)
Addended by: Cliffton AstersAMPBELL, Tishara Pizano on: 02/18/2016 04:00 PM   Modules accepted: Orders

## 2016-02-18 NOTE — Assessment & Plan Note (Signed)
Her adherence is fairly good. I did reiterate the importance of trying not to miss any doses. She will continue Triumeq and Prezcobix and I will repeat lab work today. She will follow-up in 2 months.

## 2016-02-18 NOTE — Assessment & Plan Note (Signed)
She has feeling more depressed. She met with our mental health counselor, [name], today and will schedule a follow-up visit with him.

## 2016-02-18 NOTE — Progress Notes (Signed)
Patient Active Problem List   Diagnosis Date Noted  . HIV disease ([name]) 06/20/2014    Priority: High  . Male-to-male transgender person 09/23/2015    Priority: Medium  . History of chlamydia 06/21/2014  . Bipolar disorder ([place]) 06/21/2014  . Anxiety 06/21/2014  . Cigarette smoker 06/21/2014  . History of syphilis 06/20/2014  . Dyslipidemia 06/20/2014    Patient's Medications  New Prescriptions   No medications on file  Previous Medications   ABACAVIR-DOLUTEGRAVIR-LAMIVUDINE (TRIUMEQ) 600-50-300 MG TABLET    Take 1 tablet by mouth daily.   DARUNAVIR-COBICISTAT (PREZCOBIX) 800-150 MG TABLET    TAKE 1 TABLET BY MOUTH DAILY WITH FOOD. SWALLOW WHOLE, DO NOT CRUSH, BREAK, OR CHEW TABLETS  Modified Medications   No medications on file  Discontinued Medications   No medications on file    Subjective: Jeffery [name] is in for her routine HIV follow-up visit. She recently moved to [place] for a new job but it fell through and she is now back to [place]. She had been feeling a little more depressed about the inability to find work and "move forward with her life".  She is transgender male to male and wants to start on medical therapy to begin the process of gender reassignment. She had Medicaid when she was living in [place] and was under the impression that it automatically transferred [place]. She recalls missing only 3-4 doses of her Triumeq and Prezcobix since her last visit. She continues to smoke 3 cigarettes daily.  Review of Systems: Review of Systems  Constitutional: Negative for chills, diaphoresis, fever, malaise/fatigue and weight loss.  HENT: Negative for sore throat.   Respiratory: Negative for cough, sputum production and shortness of breath.   Cardiovascular: Negative for chest pain.  Gastrointestinal: Negative for diarrhea, nausea and vomiting.  Genitourinary: Negative for dysuria and frequency.  Musculoskeletal: Negative for joint pain and myalgias.   Skin: Negative for rash.  Neurological: Negative for dizziness and headaches.  Psychiatric/Behavioral: Positive for depression and substance abuse. The patient is not nervous/anxious.        She smokes marijuana about 3 times each week.    Past Medical History:  Diagnosis Date  . Anxiety   . Chlamydia   . Depression   . HIV infection ([place])   . Hyperlipidemia   . Hypertension    Pt went off meds on his own; has been monitored without any problems  . Syphilis 2013 and 2015    Social History  Substance Use Topics  . Smoking status: Light Tobacco Smoker    Packs/day: 0.10    Types: Cigarettes  . Smokeless tobacco: Never Used     Comment: 3 cigarettes a day  . Alcohol use 0.0 oz/week     Comment: occasional    Family History  Problem Relation Age of Onset  . Hypertension Mother     No Known Allergies  Objective:  Vitals:   02/18/16 1531  BP: (!) 153/90  Pulse: (!) 106  Temp: 97.7 F (36.5 C)  TempSrc: Oral  Weight: 138 lb (62.6 kg)   Body mass index is 21.94 kg/m.  Physical Exam  Constitutional: He is oriented to person, place, and time.  HENT:  Mouth/Throat: No oropharyngeal exudate.  Eyes: Conjunctivae are normal.  Cardiovascular: Normal rate and regular rhythm.   No murmur heard. Pulmonary/Chest: Effort normal and breath sounds normal.  Abdominal: Soft. He exhibits no mass. There is no tenderness.  Musculoskeletal: Normal range  of motion.  Neurological: He is alert and oriented to person, place, and time.  Skin: No rash noted.  Psychiatric: Mood and affect normal.    Lab Results Lab Results  Component Value Date   WBC 10.0 09/23/2015   HGB 15.3 09/23/2015   HCT 45.3 09/23/2015   MCV 91.9 09/23/2015   PLT 353 09/23/2015    Lab Results  Component Value Date   CREATININE 1.04 09/23/2015   BUN 10 09/23/2015   NA 139 09/23/2015   K 4.1 09/23/2015   CL 101 09/23/2015   CO2 29 09/23/2015    Lab Results  Component Value Date   ALT 11  09/23/2015   AST 14 09/23/2015   ALKPHOS 64 09/23/2015   BILITOT 0.5 09/23/2015    Lab Results  Component Value Date   CHOL 258 (H) 09/23/2015   HDL 40 09/23/2015   LDLCALC 186 (H) 09/23/2015   TRIG 162 (H) 09/23/2015   CHOLHDL 6.5 (H) 09/23/2015   HIV 1 RNA Quant (copies/mL)  Date Value  09/23/2015 <20  07/23/2015 5,861 (H)  05/27/2015 3,136 (H)   CD4 T Cell Abs (/uL)  Date Value  09/23/2015 1,040  07/23/2015 750  06/06/2015 1,180     Problem List Items Addressed This Visit      High   HIV disease ([place])    Her adherence is fairly good. I did reiterate the importance of trying not to miss any doses. She will continue Triumeq and Prezcobix and I will repeat lab work today. She will follow-up in 2 months.        Medium   Male-to-male transgender person    We will need to have her meet with one of our financial counselors about seeing if she is eligible for [facility]. If so she will need a primary care provider.        Unprioritized   Bipolar disorder ([place])    She has feeling more depressed. She met with our mental health counselor, [name], today and will schedule a follow-up visit with him.      Cigarette smoker    I asked her to consider setting a quit date, especially given the possibility that she might start estrogen therapy soon.       Other Visit Diagnoses   None.       Michel Bickers, MD [facility] [place] [facility] [phone] pager   [phone] cell 02/18/2016, 3:56 PM

## 2016-02-18 NOTE — Assessment & Plan Note (Signed)
I asked her to consider setting a quit date, especially given the possibility that she might start estrogen therapy soon.

## 2016-02-18 NOTE — Assessment & Plan Note (Signed)
We will need to have her meet with one of our financial counselors about seeing if she is [facility]. If so she will need a primary care provider.

## 2016-02-19 LAB — T-HELPER CELL (CD4) - (RCID CLINIC ONLY)
CD4 T CELL HELPER: 29 % — AB (ref 33–55)
CD4 T Cell Abs: 910 /uL (ref 400–2700)

## 2016-02-21 LAB — HIV-1 RNA QUANT-NO REFLEX-BLD
HIV 1 RNA Quant: <20 copies/mL (ref <20)
HIV-1 RNA Quant, Log: <1.3 Log copies/mL (ref <1.30)

## 2016-02-24 ENCOUNTER — Encounter (HOSPITAL_COMMUNITY): Payer: Self-pay

## 2016-02-24 DIAGNOSIS — R259 Unspecified abnormal involuntary movements: Secondary | ICD-10-CM | POA: Insufficient documentation

## 2016-02-24 DIAGNOSIS — I10 Essential (primary) hypertension: Secondary | ICD-10-CM

## 2016-02-24 DIAGNOSIS — F918 Other conduct disorders: Secondary | ICD-10-CM | POA: Diagnosis present

## 2016-02-24 DIAGNOSIS — Z79899 Other long term (current) drug therapy: Secondary | ICD-10-CM | POA: Insufficient documentation

## 2016-02-24 DIAGNOSIS — T148 Other injury of unspecified body region: Secondary | ICD-10-CM | POA: Diagnosis not present

## 2016-02-24 DIAGNOSIS — F600 Paranoid personality disorder: Secondary | ICD-10-CM

## 2016-02-24 DIAGNOSIS — Z21 Asymptomatic human immunodeficiency virus [HIV] infection status: Secondary | ICD-10-CM | POA: Insufficient documentation

## 2016-02-24 DIAGNOSIS — F17210 Nicotine dependence, cigarettes, uncomplicated: Secondary | ICD-10-CM

## 2016-02-24 DIAGNOSIS — F1290 Cannabis use, unspecified, uncomplicated: Secondary | ICD-10-CM | POA: Insufficient documentation

## 2016-02-24 LAB — RAPID URINE DRUG SCREEN, HOSP PERFORMED
Amphetamines: NOT DETECTED
BARBITURATES: NOT DETECTED
BENZODIAZEPINES: NOT DETECTED
Cocaine: NOT DETECTED
OPIATES: NOT DETECTED
Tetrahydrocannabinol: POSITIVE — AB

## 2016-02-24 LAB — CBC
HCT: 46.4 % (ref 39.0–52.0)
Hemoglobin: 15.7 g/dL (ref 13.0–17.0)
MCH: 32.1 pg (ref 26.0–34.0)
MCHC: 33.8 g/dL (ref 30.0–36.0)
MCV: 94.9 fL (ref 78.0–100.0)
PLATELETS: 303 10*3/uL (ref 150–400)
RBC: 4.89 MIL/uL (ref 4.22–5.81)
RDW: 13.9 % (ref 11.5–15.5)
WBC: 13.8 10*3/uL — AB (ref 4.0–10.5)

## 2016-02-24 LAB — COMPREHENSIVE METABOLIC PANEL
ALBUMIN: 4.1 g/dL (ref 3.5–5.0)
ALK PHOS: 66 U/L (ref 38–126)
ALT: 17 U/L (ref 17–63)
AST: 22 U/L (ref 15–41)
Anion gap: 7 (ref 5–15)
BILIRUBIN TOTAL: 0.8 mg/dL (ref 0.3–1.2)
BUN: 12 mg/dL (ref 6–20)
CALCIUM: 9.3 mg/dL (ref 8.9–10.3)
CO2: 23 mmol/L (ref 22–32)
Chloride: 105 mmol/L (ref 101–111)
Creatinine, Ser: 1.08 mg/dL (ref 0.61–1.24)
GFR calc Af Amer: >60 mL/min (ref >60)
GFR calc non Af Amer: >60 mL/min (ref >60)
GLUCOSE: 84 mg/dL (ref 65–99)
Potassium: 3.7 mmol/L (ref 3.5–5.1)
SODIUM: 135 mmol/L (ref 135–145)
TOTAL PROTEIN: 7.2 g/dL (ref 6.5–8.1)

## 2016-02-24 LAB — ACETAMINOPHEN LEVEL: Acetaminophen (Tylenol), Serum: <10 ug/mL — ABNORMAL LOW (ref 10–30)

## 2016-02-24 LAB — ETHANOL: Alcohol, Ethyl (B): <5 mg/dL (ref <5)

## 2016-02-24 LAB — SALICYLATE LEVEL

## 2016-02-24 NOTE — ED Triage Notes (Signed)
Pt states recent increased in stressors. Pt complaining of increased anxiety and stress.

## 2016-02-24 NOTE — ED Triage Notes (Signed)
Pt denies any SI at this time, states "looking for help."

## 2016-02-25 ENCOUNTER — Emergency Department (HOSPITAL_COMMUNITY)
Admission: EM | Admit: 2016-02-25 | Discharge: 2016-02-25 | Disposition: A | Discharge status: Other Acute Inpatient Hospital | Payer: Medicaid Other | Attending: Emergency Medicine | Admitting: Emergency Medicine

## 2016-02-25 ENCOUNTER — Emergency Department (HOSPITAL_COMMUNITY)
Admission: EM | Admit: 2016-02-25 | Discharge: 2016-02-25 | Discharge status: Against Medical Advice | Payer: Medicaid Other | Source: Home / Self Care | Attending: Emergency Medicine | Admitting: Emergency Medicine

## 2016-02-25 ENCOUNTER — Encounter (HOSPITAL_COMMUNITY): Payer: Self-pay | Admitting: Emergency Medicine

## 2016-02-25 ENCOUNTER — Encounter (HOSPITAL_COMMUNITY): Payer: Self-pay | Admitting: *Deleted

## 2016-02-25 ENCOUNTER — Inpatient Hospital Stay (HOSPITAL_COMMUNITY)
Admission: AD | Admit: 2016-02-25 | Discharge: 2016-03-02 | DRG: 885 | Disposition: A | Discharge status: Home / Self Care | Payer: Medicaid Other | Attending: Psychiatry | Admitting: Psychiatry

## 2016-02-25 DIAGNOSIS — F333 Major depressive disorder, recurrent, severe with psychotic symptoms: Secondary | ICD-10-CM | POA: Diagnosis present

## 2016-02-25 DIAGNOSIS — E785 Hyperlipidemia, unspecified: Secondary | ICD-10-CM | POA: Diagnosis present

## 2016-02-25 DIAGNOSIS — F22 Delusional disorders: Secondary | ICD-10-CM

## 2016-02-25 DIAGNOSIS — F1220 Cannabis dependence, uncomplicated: Secondary | ICD-10-CM

## 2016-02-25 DIAGNOSIS — I10 Essential (primary) hypertension: Secondary | ICD-10-CM | POA: Diagnosis present

## 2016-02-25 DIAGNOSIS — G4700 Insomnia, unspecified: Secondary | ICD-10-CM | POA: Diagnosis present

## 2016-02-25 DIAGNOSIS — F419 Anxiety disorder, unspecified: Secondary | ICD-10-CM | POA: Diagnosis present

## 2016-02-25 DIAGNOSIS — F332 Major depressive disorder, recurrent severe without psychotic features: Secondary | ICD-10-CM | POA: Diagnosis present

## 2016-02-25 DIAGNOSIS — R45851 Suicidal ideations: Secondary | ICD-10-CM | POA: Diagnosis present

## 2016-02-25 DIAGNOSIS — Z8249 Family history of ischemic heart disease and other diseases of the circulatory system: Secondary | ICD-10-CM

## 2016-02-25 DIAGNOSIS — F649 Gender identity disorder, unspecified: Secondary | ICD-10-CM | POA: Diagnosis present

## 2016-02-25 DIAGNOSIS — Z046 Encounter for general psychiatric examination, requested by authority: Secondary | ICD-10-CM

## 2016-02-25 DIAGNOSIS — F17210 Nicotine dependence, cigarettes, uncomplicated: Secondary | ICD-10-CM | POA: Diagnosis present

## 2016-02-25 DIAGNOSIS — Z915 Personal history of self-harm: Secondary | ICD-10-CM | POA: Diagnosis not present

## 2016-02-25 DIAGNOSIS — B20 Human immunodeficiency virus [HIV] disease: Secondary | ICD-10-CM | POA: Diagnosis present

## 2016-02-25 DIAGNOSIS — T07XXXA Unspecified multiple injuries, initial encounter: Secondary | ICD-10-CM

## 2016-02-25 DIAGNOSIS — T148 Other injury of unspecified body region: Secondary | ICD-10-CM | POA: Diagnosis not present

## 2016-02-25 MED ORDER — ABACAVIR-DOLUTEGRAVIR-LAMIVUD 600-50-300 MG PO TABS
1.0000 | ORAL_TABLET | Freq: Every day | ORAL | Status: DC
  Administered 2016-02-25: 1 via ORAL
  Filled 2016-02-25: qty 1

## 2016-02-25 MED ORDER — ALUM & MAG HYDROXIDE-SIMETH 200-200-20 MG/5ML PO SUSP
30.0000 mL | ORAL | Status: DC | PRN
Start: 2016-02-25 — End: 2016-02-27

## 2016-02-25 MED ORDER — ASENAPINE MALEATE 5 MG SL SUBL
5.0000 mg | SUBLINGUAL_TABLET | Freq: Every day | SUBLINGUAL | Status: DC
  Administered 2016-02-25: 5 mg via SUBLINGUAL
  Filled 2016-02-25 (×4): qty 1

## 2016-02-25 MED ORDER — FLUOXETINE HCL 20 MG PO CAPS
20.0000 mg | ORAL_CAPSULE | Freq: Every day | ORAL | Status: DC
  Administered 2016-02-26 – 2016-03-02 (×6): 20 mg via ORAL
  Filled 2016-02-25: qty 1
  Filled 2016-02-25: qty 7
  Filled 2016-02-25 (×7): qty 1

## 2016-02-25 MED ORDER — ACETAMINOPHEN 325 MG PO TABS
650.0000 mg | ORAL_TABLET | Freq: Four times a day (QID) | ORAL | Status: DC | PRN
  Administered 2016-02-25 – 2016-03-01 (×4): 650 mg via ORAL
  Filled 2016-02-25 (×5): qty 2

## 2016-02-25 MED ORDER — ABACAVIR-DOLUTEGRAVIR-LAMIVUD 600-50-300 MG PO TABS
1.0000 | ORAL_TABLET | Freq: Every day | ORAL | Status: DC
  Administered 2016-02-26 – 2016-03-02 (×6): 1 via ORAL
  Filled 2016-02-25 (×9): qty 1

## 2016-02-25 MED ORDER — BACITRACIN ZINC 500 UNIT/GM EX OINT
1.0000 "application " | TOPICAL_OINTMENT | Freq: Two times a day (BID) | CUTANEOUS | Status: DC
  Administered 2016-02-26 – 2016-03-02 (×8): 1 via TOPICAL
  Filled 2016-02-25 (×2): qty 28.35

## 2016-02-25 MED ORDER — FLUOXETINE HCL 20 MG PO CAPS
20.0000 mg | ORAL_CAPSULE | Freq: Every day | ORAL | Status: DC
  Administered 2016-02-25: 20 mg via ORAL
  Filled 2016-02-25: qty 1

## 2016-02-25 MED ORDER — TRAZODONE HCL 50 MG PO TABS
50.0000 mg | ORAL_TABLET | Freq: Every evening | ORAL | Status: DC | PRN
  Administered 2016-02-25: 50 mg via ORAL
  Filled 2016-02-25 (×7): qty 1

## 2016-02-25 MED ORDER — DARUNAVIR-COBICISTAT 800-150 MG PO TABS: 1.0000 | ORAL_TABLET | Freq: Every day | ORAL | Status: DC

## 2016-02-25 MED ORDER — MAGNESIUM HYDROXIDE 400 MG/5ML PO SUSP: 30.0000 mL | Freq: Every day | ORAL | Status: DC | PRN

## 2016-02-25 MED ORDER — ABACAVIR-DOLUTEGRAVIR-LAMIVUD 600-50-300 MG PO TABS: 1.0000 | ORAL_TABLET | Freq: Every day | ORAL | Status: DC

## 2016-02-25 MED ORDER — ASENAPINE MALEATE 5 MG SL SUBL: 5.0000 mg | SUBLINGUAL_TABLET | Freq: Every day | SUBLINGUAL | Status: DC

## 2016-02-25 MED ORDER — DARUNAVIR-COBICISTAT 800-150 MG PO TABS
1.0000 | ORAL_TABLET | Freq: Every day | ORAL | Status: DC
  Administered 2016-02-25: 1 via ORAL
  Filled 2016-02-25: qty 1

## 2016-02-25 MED ORDER — BACITRACIN ZINC 500 UNIT/GM EX OINT
1.0000 "application " | TOPICAL_OINTMENT | Freq: Two times a day (BID) | CUTANEOUS | Status: DC
  Filled 2016-02-25: qty 0.9
  Filled 2016-02-25: qty 28.35

## 2016-02-25 MED ORDER — DARUNAVIR-COBICISTAT 800-150 MG PO TABS
1.0000 | ORAL_TABLET | Freq: Every day | ORAL | Status: DC
  Administered 2016-02-26 – 2016-03-02 (×6): 1 via ORAL
  Filled 2016-02-25 (×9): qty 1

## 2016-02-25 NOTE — ED Notes (Signed)
Pt discharged ambulatory with [employer].  Pt was calm and cooperative.  All belongings were sent with pt.

## 2016-02-25 NOTE — Tx Team (Signed)
Initial Interdisciplinary Treatment Plan   PATIENT STRESSORS: Financial difficulties Occupational concerns Traumatic event   PATIENT STRENGTHS: Ability for insight Communication skills Motivation for treatment/growth   PROBLEM LIST: Problem List/Patient Goals Date to be addressed Date deferred Reason deferred Estimated date of resolution  Depression 02/25/16     Anxiety 02/25/16     "find answer to these repeated thoughts" 02/25/16     "For some one to understand me" 02/25/16                                    DISCHARGE CRITERIA:  Ability to meet basic life and health needs Adequate post-discharge living arrangements Improved stabilization in mood, thinking, and/or behavior Medical problems require only outpatient monitoring  PRELIMINARY DISCHARGE PLAN: Attend aftercare/continuing care group Attend PHP/IOP Attend 12-step recovery group Outpatient therapy  PATIENT/FAMIILY INVOLVEMENT: This treatment plan has been presented to and reviewed with the patient, Jeffery [name], and/or family member.  The patient and family have been given the opportunity to ask questions and make suggestions.  Jeffery [name] 02/25/2016, 4:49 PM

## 2016-02-25 NOTE — ED Provider Notes (Signed)
[facility]-EMERGENCY DEPT Provider Note   CSN: [id number] Arrival date & time: 02/24/16  2056     History   Chief Complaint Chief Complaint  Patient presents with  . Depression  . Anxiety    HPI Jeffery [name] is a 25 y.o. male.  HPI   Patient to the ER voluntarily brought in by mom. He is HIV positive, PMH of anxiety, hypertension, syphilis, depression. Per mom he has been hearing voices and paranoid. He believes things that aren't true. He has become so upset with outbursts of anger that he has swallowed bleach and ingested pills. He has also sat in a rocking chair with a knife and threatened to kill himself and harm others. The patient says that no one believes him that everything that is happening has already happened before in the past. The patient is agitated and irritable.  Past Medical History:  Diagnosis Date  . Anxiety   . Chlamydia   . Depression   . HIV infection (HCC)   . Hyperlipidemia   . Hypertension    Pt went off meds on his own; has been monitored without any problems  . Syphilis 2013 and 2015    Patient Active Problem List   Diagnosis Date Noted  . Male-to-male transgender person 09/23/2015  . History of chlamydia 06/21/2014  . Bipolar disorder (HCC) 06/21/2014  . Anxiety 06/21/2014  . Cigarette smoker 06/21/2014  . HIV disease (HCC) 06/20/2014  . History of syphilis 06/20/2014  . Dyslipidemia 06/20/2014    History reviewed. No pertinent surgical history.     Home Medications    Prior to Admission medications   Medication Sig Start Date End Date Taking? Authorizing Provider  abacavir-dolutegravir-lamiVUDine (TRIUMEQ) 600-50-300 MG tablet Take 1 tablet by mouth daily. 07/23/15   Cliffton AstersJohn Campbell, MD  darunavir-cobicistat (PREZCOBIX) 800-150 MG tablet TAKE 1 TABLET BY MOUTH DAILY WITH FOOD. SWALLOW WHOLE, DO NOT CRUSH, BREAK, OR CHEW TABLETS 07/23/15   Cliffton AstersJohn Campbell, MD    Family History Family History  Problem Relation Age of Onset  .  Hypertension Mother     Social History Social History  Substance Use Topics  . Smoking status: Light Tobacco Smoker    Packs/day: 0.10    Types: Cigarettes  . Smokeless tobacco: Never Used     Comment: 3 cigarettes a day  . Alcohol use 0.0 oz/week     Comment: occasional     Allergies   Review of patient's allergies indicates no known allergies.   Review of Systems Review of Systems  Review of Systems All other systems negative except as documented in the HPI. All pertinent positives and negatives as reviewed in the HPI.  Physical Exam Updated Vital Signs BP 136/91   Pulse 61   Temp 98.2 F (36.8 C) (Oral)   Resp 16   SpO2 100%   Physical Exam  Constitutional: He is oriented to person, place, and time. He appears well-developed and well-nourished. He appears distressed.  HENT:  Head: Normocephalic and atraumatic.  Eyes: Pupils are equal, round, and reactive to light.  Neck: Normal range of motion.  Cardiovascular: Normal rate.   Pulmonary/Chest: Effort normal.  Musculoskeletal: Normal range of motion.  Neurological: He is alert and oriented to person, place, and time.  Skin: Skin is warm and dry.  Psychiatric: His mood appears anxious. His affect is angry. He is agitated. Thought content is paranoid and delusional. He exhibits a depressed mood. He expresses suicidal ideation. He expresses no suicidal plans.  ED Treatments / Results  Labs (all labs ordered are listed, but only abnormal results are displayed) Labs Reviewed  ACETAMINOPHEN LEVEL - Abnormal; Notable for the following:       Result Value   Acetaminophen (Tylenol), Serum <10 (*)    All other components within normal limits  CBC - Abnormal; Notable for the following:    WBC 13.8 (*)    All other components within normal limits  URINE RAPID DRUG SCREEN, HOSP PERFORMED - Abnormal; Notable for the following:    Tetrahydrocannabinol POSITIVE (*)    All other components within normal limits    COMPREHENSIVE METABOLIC PANEL  ETHANOL  SALICYLATE LEVEL    EKG  EKG Interpretation None       Radiology No results found.  Procedures Procedures (including critical care time)  Medications Ordered in ED Medications - No data to display   Initial Impression / Assessment and Plan / ED Course  I have reviewed the triage vital signs and the nursing notes.  Pertinent labs & imaging results that were available during my care of the patient were reviewed by me and considered in my medical decision making (see chart for details).  Clinical Course    After I left exam room to get resources and IVC paperwork, the patient ran out of the hospital. Security and [employer] were unable to bring him back to the ER. Mom advised to go to [facility] office to take out IVC paperwork.   Final Clinical Impressions(s) / ED Diagnoses   Final diagnoses:  None    New Prescriptions New Prescriptions   No medications on file     Marlon Peliffany Tremar Wickens, Cordelia Poche-C 02/25/16 0222    Shon Batonourtney F Horton, MD 02/25/16 [phone]

## 2016-02-25 NOTE — ED Notes (Signed)
Pt oriented to room and unit.  He is pleasant and cooperative.  Denies S/I. H/I, and AVH.  15 minute checks and video monitoring in place.
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
## 2016-02-25 NOTE — Progress Notes (Signed)
Adult Psychoeducational Group Note  Date:  02/25/2016 Time:  8:58 PM  Group Topic/Focus:  Wrap-Up Group:   The focus of this group is to help patients review their daily goal of treatment and discuss progress on daily workbooks.   Participation Level:  Active  Participation Quality:  Appropriate  Affect:  Appropriate  Cognitive:  Alert  Insight: Appropriate  Engagement in Group:  Engaged  Modes of Intervention:  Discussion  Additional Comments:  Patient states, "my day was okay". Patient states he talked with his mom. Yuvin Bussiere L Hanifah Royse 02/25/2016, 8:58 PM

## 2016-02-25 NOTE — Progress Notes (Signed)
Patient ID: Jeffery [name], male   DOB: 03/08/1991, 25 y.o.   MRN: [id number] D: Client in bed most of the shift, smiles but does not focus on reason for admission, but does report he will need something for sleep since this is his first night. A: Writer provided emotional support. Medications reviewed, administered as ordered. Staff will monitor q2515min for safety. R: client is safe on the unit.
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
## 2016-02-25 NOTE — ED Notes (Signed)
Pts mom left contact info  Laurette SchimkeLisa Tyler [phone]
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
## 2016-02-25 NOTE — ED Notes (Signed)
Bed: WA03 Expected date:  Expected time:  Means of arrival:  Comments: Combative patient with [employer]

## 2016-02-25 NOTE — ED Notes (Signed)
Pt eloped prior to receiving IVC papers. Security and police made aware and unable to detain pt at this time.

## 2016-02-25 NOTE — Progress Notes (Signed)
Admission Note:  25 year old male who presents IVC, in no acute distress, for the treatment of SI, Depression, and Anxiety. Patient appears flat and depressed. Patient was calm and cooperative with admission process. Patient currently denies SI and contracts for safety upon admission.  Patient reports auditory hallucinations.  Patient reports "repetive thoughts" and states "It's like I relive past events and days over and over".  Patient states "It's almost like a commentary to a movie".  Pt has reports "sometimes the voices tell me to harm self and I have acted on it before".  Patient showed nurse old scars from self-inflicted cuts to left arm.  Patient reports that he identifies as male. Patient is currently using male pronouns while his appearance is male.  Patient reports multiple prior suicide attempts with last attempt being in July 2017.  Patient reports in July he took "a bunch of pills and drunk bleach".  Patient reports "I have also tried hanging myself and cutting myself".  Patient has previous inpatient admissions.  Past medical of Depression, Anxiety, HIV positive.  Patient currently lives with his mother and identifies his mother as his support system.  Skin was assessed.  Patient has abrasions to legs and arms bilateral and swelling and redness to right arm. Multiple tattoos.  Patient searched and no contraband found, POC and unit policies explained and understanding verbalized. Consents obtained. Food and fluids offered and accepted. Patient had no additional questions or concerns.

## 2016-02-25 NOTE — BH Assessment (Signed)
[facility] Assessment Progress Note  Per Thedore MinsMojeed Akintayo, MD, this pt requires psychiatric hospitalization at this time.  Jeffery Heinrichina Tate, RN, [facility] has assigned pt to Rm 401-1.  Pt presents under IVC which Dr Jannifer FranklinAkintayo has upheld, and [facility].  Pt's nurse, Kendal Hymendie, has been notified, and agrees to call report to [phone].  Pt is to be transported via [employer]law enforcement.  Jeffery Canninghomas Jesly Hartmann, MA Triage Specialist [phone]

## 2016-02-26 ENCOUNTER — Encounter: Payer: Self-pay | Admitting: Internal Medicine

## 2016-02-26 DIAGNOSIS — F1220 Cannabis dependence, uncomplicated: Secondary | ICD-10-CM

## 2016-02-26 DIAGNOSIS — F333 Major depressive disorder, recurrent, severe with psychotic symptoms: Principal | ICD-10-CM

## 2016-02-26 MED ORDER — OLANZAPINE 5 MG PO TBDP
7.5000 mg | ORAL_TABLET | Freq: Every day | ORAL | Status: DC
  Filled 2016-02-26: qty 1.5

## 2016-02-26 MED ORDER — TRAZODONE HCL 50 MG PO TABS
50.0000 mg | ORAL_TABLET | Freq: Every evening | ORAL | Status: DC | PRN
  Administered 2016-02-28 – 2016-03-01 (×3): 50 mg via ORAL
  Filled 2016-02-26 (×2): qty 1
  Filled 2016-02-26: qty 7
  Filled 2016-02-26: qty 1

## 2016-02-26 MED ORDER — LORAZEPAM 0.5 MG PO TABS
0.5000 mg | ORAL_TABLET | Freq: Four times a day (QID) | ORAL | Status: DC | PRN
  Administered 2016-02-29: 0.5 mg via ORAL
  Filled 2016-02-26: qty 1

## 2016-02-26 MED ORDER — OLANZAPINE 7.5 MG PO TABS
7.5000 mg | ORAL_TABLET | Freq: Every day | ORAL | Status: DC
  Administered 2016-02-26: 7.5 mg via ORAL
  Filled 2016-02-26 (×4): qty 1

## 2016-02-26 NOTE — Progress Notes (Signed)
Adult Psychoeducational Group Note  Date:  02/26/2016 Time:  8:41 PM  Group Topic/Focus:  Wrap-Up Group:   The focus of this group is to help patients review their daily goal of treatment and discuss progress on daily workbooks.   Participation Level:  Did Not Attend  Participation Quality:  Did not attend  Affect:  Did not attend  Cognitive:  Did not attend  Insight: None  Engagement in Group:  Did not attend  Modes of Intervention:  Did not attend  Additional Comments:  Patient did not attend wrap up group tonight.  Shataria Crist L Mallarie Voorhies 02/26/2016, 8:41 PM

## 2016-02-26 NOTE — Tx Team (Signed)
Interdisciplinary Treatment Plan Update (Adult) Date: 02/26/2016   Date: 02/26/2016 3:44 PM  Progress in Treatment:  Attending groups: Pt is new to milieu, continuing to assess  Participating in groups: Pt is new to milieu, continuing to assess  Taking medication as prescribed: Yes  Tolerating medication: Yes  Family/Significant othe contact made: No, CSW attempting to make contact with mother Patient understands diagnosis: Minimal insight Discussing patient identified problems/goals with staff: Yes  Medical problems stabilized or resolved: Yes  Denies suicidal/homicidal ideation: Recently admitted with SI Patient has not harmed self or Others: Yes   New problem(s) identified: None identified at this time.   Discharge Plan or Barriers: Pt will return home and follow-up with outpatient services.   Additional comments:  Patient and CSW reviewed pt's identified goals and treatment plan. Patient verbalized understanding and agreed to treatment plan.   Reason for Continuation of Hospitalization:  Depression Medication stabilization Suicidal ideation Hallucinations  Estimated length of stay: 3-5 days  Review of initial/current patient goals per problem list:   1.  Goal(s): Patient will participate in aftercare plan  Met:  Yes  Target date: 3-5 days from date of admission   As evidenced by: Patient will participate within aftercare plan AEB aftercare provider and housing plan at discharge being identified.   02/26/16: Pt will return home and follow-up with outpatient services.   2.  Goal (s): Patient will exhibit decreased depressive symptoms and suicidal ideations.  Met:  No  Target date: 3-5 days from date of admission   As evidenced by: Patient will utilize self rating of depression at 3 or below and demonstrate decreased signs of depression or be deemed stable for discharge by MD. 02/26/16: Pt was admitted with symptoms of depression, rating 10/10. Pt continues to present  with flat affect and depressive symptoms.  Pt will demonstrate decreased symptoms of depression and rate depression at 3/10 or lower prior to discharge.  5.  Goal(s): Patient will demonstrate decreased signs of psychosis  . Met:  No . Target date: 3-5 days from date of admission  . As evidenced by: Patient will demonstrate decreased frequency of AVH or return to baseline function - 02/29/16: Pt continues to endorse AH; he also has some paranoid thoughts with vague speech  Attendees:  Patient:    Family:    Physician: Dr. Parke Poisson, MD  02/26/2016 3:44 PM  Nursing:  02/26/2016 3:44 PM  Clinical Social Worker Peri Maris, [place] 02/26/2016 3:44 PM  Other: Tilden Fossa, [place] 02/26/2016 3:44 PM  Clinical: Lars Pinks, RN Case manager  02/26/2016 3:44 PM  Other:  02/26/2016 3:44 PM  Other:     Peri Maris, [place] Work [phone]

## 2016-02-26 NOTE — Progress Notes (Signed)
DAR NOTE: Pt present with flat and depressed mood in the unit. Pt stated he feel better today than yesterday and slept a little better last night. Pt has been in the dayroom interacting with peers. Pt reports depression at 4, hopelessness at 6, and anxiety at 6. Maintained on routine safety checks.  Medications given as prescribed.  Support and encouragement offered as needed.  States goal for today is "Not allowing myself to become consumed by my thoughts." Offered no complaint.
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
## 2016-02-26 NOTE — Progress Notes (Signed)
Patient ID: Jeffery [name], male   DOB: July 16, 1990, 25 y.o.   MRN: [id number] D: client visible on the unit. Client reports been to group, but "hadn't shared yet" "I get a little restless so I have to keep moving" "we went outside, I like to go outside, seem like it help me cope better" A:Writer provided emotional support, encouraged client to continue groups. Medications reviewed, administered as ordered. Staff will monitor q7215min for safety. R:Client is safe on the unit, attended group.

## 2016-02-26 NOTE — Progress Notes (Signed)
Recreation Therapy Notes  Date: 02/26/16 Time: 0930 Location: [address] Group Room  Group Topic: Stress Management  Goal Area(s) Addresses:  Patient will verbalize importance of using healthy stress management.  Patient will identify positive emotions associated with healthy stress management.   Intervention: Stress Management  Activity :  Floating on a Cloud.  LRT introduced the technique of guided imagery to the patients.  Patients were asked to follow along as LRT read the script to participate in the activity.  Education: Stress Management, Discharge Planning.   Education Outcome: Acknowledges edcuation/In group clarification offered/Needs additional education  Clinical Observations/Feedback:  Pt did not attend group.       Caroll RancherMarjette Kennth Vanbenschoten, LRT/CTRS      Lillia AbedLindsay, Lynnex Fulp A 02/26/2016 12:12 PM
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
## 2016-02-26 NOTE — [facility] Group Notes (Signed)
[facility] LCSW Group Therapy 02/26/2016 1:15 PM  Type of Therapy: Group Therapy- Emotion Regulation  Participation Level: Minimal  Participation Quality:  Reserved  Affect: Appropriate  Cognitive: Alert and Oriented   Insight:  Unable to assess  Engagement in Therapy: Developing/Improving and Engaged   Modes of Intervention: Clarification, Confrontation, Discussion, Education, Exploration, Limit-setting, Orientation, Problem-solving, Rapport Building, Dance movement psychotherapisteality Testing, Socialization and Support  Summary of Progress/Problems: The topic for group today was emotional regulation. This group focused on both positive and negative emotion identification and allowed group members to process ways to identify feelings, regulate negative emotions, and find healthy ways to manage internal/external emotions. Group members were asked to reflect on a time when their reaction to an emotion led to a negative outcome and explored how alternative responses using emotion regulation would have benefited them. Group members were also asked to discuss a time when emotion regulation was utilized when a negative emotion was experienced. Pt came in late to group session and did not participate in group discussion.   Chad CordialLauren Carter, LCSWA 02/26/2016 3:48 PM

## 2016-02-27 LAB — LIPID PANEL
CHOL/HDL RATIO: 5.8 ratio
CHOLESTEROL: 266 mg/dL — AB (ref 0–200)
HDL: 46 mg/dL (ref >40)
LDL CALC: 200 mg/dL — AB (ref 0–99)
Triglycerides: 98 mg/dL (ref <150)
VLDL: 20 mg/dL (ref 0–40)

## 2016-02-27 LAB — TSH: TSH: 1.689 u[IU]/mL (ref 0.350–4.500)

## 2016-02-27 MED ORDER — OLANZAPINE 10 MG PO TABS
10.0000 mg | ORAL_TABLET | Freq: Every day | ORAL | Status: DC
  Administered 2016-02-27 – 2016-02-29 (×3): 10 mg via ORAL
  Filled 2016-02-27 (×6): qty 1

## 2016-02-27 NOTE — [facility] Group Notes (Signed)
[facility] Group Notes:  (Nursing/MHT/Case Management/Adjunct)  Date:  02/27/2016  Time:  2:32 PM  Type of Therapy:  Nurse Education  Participation Level:  Active  Participation Quality:  Appropriate and Attentive  Affect:  Appropriate  Cognitive:  Alert and Appropriate  Insight:  Appropriate and Good  Engagement in Group:  Engaged  Modes of Intervention:  Discussion and Education  Summary of Progress/Problems: Topic was on leisure and lifestyle changes. Discussed the importance of choosing a healthy leisure activities. Group encouraged to surround themselves with positive and healthy group/support system when changing to a healthy lifestyle. Patient was receptive and contributed Jeffery [name] 02/27/2016, 2:32 PM

## 2016-02-27 NOTE — Progress Notes (Signed)
D    Pt is pleasant and cooperative   He smiles constantly and inappropriately and would appear to be responding to internal stimuli   He is compliant with treatment and participates in unit activities    He interacts appropriately but minimally with others A   Verbal support given   Medications administered and educated on same   Monitor for effectiveness    Q 15 min checks R   Pt is safe and receptive to verbal support

## 2016-02-27 NOTE — Progress Notes (Signed)
DAR NOTE: Patient presents with anxious affect and depressed mood.  Denies  auditory and visual hallucinations.  Rates depression at 4, hopelessness at 4, and anxiety at 4.  Described energy level as low and concentration as good.  Maintained on routine safety checks.  Medications given as prescribed.  Support and encouragement offered as needed.  Attended group and participated.  States goal for today is "not focusing on things in my past."  Patient observed socializing with peers in the dayroom.  Tylenol 650 mg given for complain of leg pain with good effect.

## 2016-02-27 NOTE — Progress Notes (Signed)
Adult Psychoeducational Group Note  Date:  02/27/2016 Time:  11:33 AM  Group Topic/Focus:  Goals Group:   The focus of this group is to help patients establish daily goals to achieve during treatment and discuss how the patient can incorporate goal setting into their daily lives to aide in recovery.   Participation Level:  Active  Participation Quality:  Appropriate  Affect:  Appropriate  Cognitive:  Appropriate  Insight: Appropriate and Good  Engagement in Group:  Engaged  Modes of Intervention:  Discussion  Additional Comments:  Pt did participate in group today.  Pt states that he wants to find happiness within himself, stop isolating himself and interact more with people. Jermany Rimel R Hyden Soley 02/27/2016, 11:33 AM

## 2016-02-27 NOTE — Progress Notes (Signed)
Adult Psychoeducational Group Note  Date:  02/27/2016 Time:  9:40 PM  Group Topic/Focus:  Wrap-Up Group:   The focus of this group is to help patients review their daily goal of treatment and discuss progress on daily workbooks.   Participation Level:  Active  Participation Quality:  Appropriate  Affect:  Appropriate  Cognitive:  Alert  Insight: Appropriate  Engagement in Group:  Engaged  Modes of Intervention:  Discussion  Additional Comments:  Patient states, "I had a decent day". Patient's goal for today was "to socialize with others". Wei Newbrough L Tearsa Kowalewski 02/27/2016, 9:40 PM

## 2016-02-27 NOTE — Progress Notes (Signed)
[facility] MD Progress Note  02/27/2016 3:30 PM Jeffery [name]  MRN:  [id number] Subjective:   Patient states " today I feel about the same." States " I feel like I am stuck, like moving forward is difficult, so today I just want to rest ". ( states he is referring partly to  long term plan of starting hormonal treatment .) . States he continues to hear voices commenting on events that are occurring . Denies command hallucinations. These experiences are not new for him and states he has been hearing him " most of my life ".  Objective : I have discussed case with treatment team and have met with patient . Patient presents vaguely irritable today, although calm, cooperative with session . Denies suicidal ideations . Describes, as above, ongoing hallucinations, but does not present internally preoccupied at this time. Also reports a sense of deja vu, stating he feels that what is occurring today, has happened to him before . No disruptive behaviors on unit, going to some groups. At this time denies medication side effects  Principal Problem:  Major Depression with psychotic features  Diagnosis:   Patient Active Problem List   Diagnosis Date Noted  . Major depressive disorder, recurrent episode, severe, with psychosis ([place]) [F33.3] 02/25/2016  . Cannabis use disorder, severe, dependence ([place]) [F12.20] 02/25/2016  . Major depressive disorder, recurrent episode, severe, with psychotic behavior ([place]) [F33.3] 02/25/2016  . Male-to-male transgender person [F64.0] 09/23/2015  . History of chlamydia [Z86.19] 06/21/2014  . Bipolar disorder ([place]) [F31.9] 06/21/2014  . Anxiety [F41.9] 06/21/2014  . Cigarette smoker [Z72.0] 06/21/2014  . HIV disease ([place]) [B20] 06/20/2014  . History of syphilis [Z86.19] 06/20/2014  . Dyslipidemia [E78.5] 06/20/2014   Total Time spent with patient: 20 minutes    Past Medical History:  Past Medical History:  Diagnosis Date  . Anxiety   . Chlamydia   . Depression    . HIV infection ([place])   . Hyperlipidemia   . Hypertension    Pt went off meds on his own; has been monitored without any problems  . Syphilis 2013 and 2015   History reviewed. No pertinent surgical history. Family History:  Family History  Problem Relation Age of Onset  . Hypertension Mother    Social History:  History  Alcohol Use  . 0.0 oz/week    Comment: occasional     History  Drug Use  . Frequency: 7.0 times per week  . Types: Marijuana    Comment: Documented hx marijuana    Social History   Social History  . Marital status: Single    Spouse name: N/A  . Number of children: N/A  . Years of education: N/A   Social History Main Topics  . Smoking status: Light Tobacco Smoker    Packs/day: 0.10    Types: Cigarettes  . Smokeless tobacco: Never Used     Comment: 3 cigarettes a day  . Alcohol use 0.0 oz/week     Comment: occasional  . Drug use:     Frequency: 7.0 times per week    Types: Marijuana     Comment: Documented hx marijuana  . Sexual activity: Not Asked     Comment: given condoms   Other Topics Concern  . None   Social History Narrative  . None   Additional Social History:   Sleep: Good  Appetite:  Good  Current Medications: Current Facility-Administered Medications  Medication Dose Route Frequency Provider Last Rate Last Dose  . abacavir-dolutegravir-lamiVUDine (TRIUMEQ)  600-50-300 MG per tablet 1 tablet  1 tablet Oral Daily Jenne Campus, MD   1 tablet at 02/27/16 0819  . acetaminophen (TYLENOL) tablet 650 mg  650 mg Oral Q6H PRN Patrecia Pour, NP   650 mg at 02/27/16 1206  . bacitracin ointment 1 application  1 application Topical BID Patrecia Pour, NP   1 application at 63/84/66 1653  . darunavir-cobicistat (PREZCOBIX) 800-150 MG per tablet 1 tablet  1 tablet Oral Daily Jenne Campus, MD   1 tablet at 02/27/16 0819  . FLUoxetine (PROZAC) capsule 20 mg  20 mg Oral Daily Patrecia Pour, NP   20 mg at 02/27/16 0819  . LORazepam  (ATIVAN) tablet 0.5 mg  0.5 mg Oral Q6H PRN Jenne Campus, MD      . OLANZapine (ZYPREXA) tablet 7.5 mg  7.5 mg Oral QHS Myer Peer Sharday Michl, MD   7.5 mg at 02/26/16 2145  . traZODone (DESYREL) tablet 50 mg  50 mg Oral QHS PRN Jenne Campus, MD        Lab Results:  Results for orders placed or performed during the hospital encounter of 02/25/16 (from the past 48 hour(s))  Lipid panel     Status: Abnormal   Collection Time: 02/27/16  7:02 AM  Result Value Ref Range   Cholesterol 266 (H) 0 - 200 mg/dL   Triglycerides 98 <150 mg/dL   HDL 46 >40 mg/dL   Total CHOL/HDL Ratio 5.8 RATIO   VLDL 20 0 - 40 mg/dL   LDL Cholesterol 200 (H) 0 - 99 mg/dL    Comment:        Total Cholesterol/HDL:CHD Risk Coronary Heart Disease Risk Table                     Men   Women  1/2 Average Risk   3.4   3.3  Average Risk       5.0   4.4  2 X Average Risk   9.6   7.1  3 X Average Risk  23.4   11.0        Use the calculated Patient Ratio above and the CHD Risk Table to determine the patient's CHD Risk.        ATP III CLASSIFICATION (LDL):  <100     mg/dL   Optimal  100-129  mg/dL   Near or Above                    Optimal  130-159  mg/dL   Borderline  160-189  mg/dL   High  >190     mg/dL   Very High Performed at [facility]   TSH     Status: None   Collection Time: 02/27/16  7:02 AM  Result Value Ref Range   TSH 1.689 0.350 - 4.500 uIU/mL    Comment: Performed at [facility]    Blood Alcohol level:  Lab Results  Component Value Date   [facility] <5 59/93/5701    Metabolic Disorder Labs: No results found for: HGBA1C, MPG No results found for: PROLACTIN Lab Results  Component Value Date   CHOL 266 (H) 02/27/2016   TRIG 98 02/27/2016   HDL 46 02/27/2016   CHOLHDL 5.8 02/27/2016   VLDL 20 02/27/2016   LDLCALC 200 (H) 02/27/2016   LDLCALC 186 (H) 09/23/2015    Physical Findings: AIMS: Facial and Oral Movements Muscles of Facial Expression: None,  normal Lips and Perioral Area: None, normal Jaw: None, normal Tongue: None, normal,Extremity Movements Upper (arms, wrists, hands, fingers): None, normal Lower (legs, knees, ankles, toes): None, normal, Trunk Movements Neck, shoulders, hips: None, normal, Overall Severity Severity of abnormal movements (highest score from questions above): None, normal Incapacitation due to abnormal movements: None, normal Patient's awareness of abnormal movements (rate only patient's report): No Awareness, Dental Status Current problems with teeth and/or dentures?: No Does patient usually wear dentures?: No  CIWA:    COWS:     Musculoskeletal: Strength & Muscle Tone: within normal limits Gait & Station: normal Patient leans: N/A  Psychiatric Specialty Exam: Physical Exam  ROS denies headache, denies chest pain, no shortness of breath, no nausea, no vomiting   Blood pressure (!) 139/95, pulse (!) 55, temperature 98.2 F (36.8 C), temperature source Oral, resp. rate 16, height 5' 6.5" (1.689 m), weight 132 lb (59.9 kg), SpO2 100 %.Body mass index is 20.99 kg/m.  General Appearance: Fairly Groomed  Eye Contact:  Fair  Speech:  Normal Rate  Volume:  Normal  Mood:  states mood is improved, today presents vaguely irritable   Affect:  mildly irritable   Thought Process:  Linear  Orientation:  Other:  fully alert and attentive   Thought Content:  describes ongoing hallucinations, does not appear internally preoccupied- describes as voices commenting on occureces, daily events, denies command hallucinations   Suicidal Thoughts:  No- at this time denies any suicidal or self injurious ideations , contracts for safety on unit  Homicidal Thoughts:  No  Memory:  recent and remote grossly intact   Judgement:  Fair  Insight:  Lacking  Psychomotor Activity:  Normal  Concentration:  Concentration: Good and Attention Span: Good  Recall:  Good  Fund of Knowledge:  Good  Language:  Good  Akathisia:  NA   Handed:  Right  AIMS (if indicated):     Assets:  Desire for Improvement Resilience  ADL's:  Intact  Cognition:  WNL  Sleep:  Number of Hours: 6.5   Assessment - patient reports some improvement in mood, but continues to report feeling frustrated and sad about his current situation . He denies suicidal ideations at this time . He reports ongoing auditory hallucinations as described above , but does not appear internally preoccupied or distracted at this time . Thus far tolerating medications well .    Treatment Plan Summary: Daily contact with patient to assess and evaluate symptoms and progress in treatment, Medication management, Plan inpatient admission  and medications as below  Encourage group, milieu participation to work on coping skills and symptom reduction  Continue Prozac 20 mgrs QDAY for depression  Increase Zyprexa to 10 mgrs QHS for psychotic symptoms and mood disorder  Continue antiretroviral management for HIV management Treatment team working on disposition planning options  Neita Garnet, MD 02/27/2016, 3:30 PM

## 2016-02-27 NOTE — [facility] Group Notes (Signed)
[facility] 02/27/2016 1:15pm  Type of Therapy: Mental Health Association Presentation  Pt attended briefly but left early and did not return.   Chad CordialLauren Carter, LCSWA 02/27/2016 1:57 PM

## 2016-02-28 LAB — HEMOGLOBIN A1C
HEMOGLOBIN A1C: 5.5 % (ref 4.8–5.6)
Mean Plasma Glucose: 111 mg/dL

## 2016-02-28 LAB — PROLACTIN: PROLACTIN: 33.4 ng/mL — AB (ref 4.0–15.2)

## 2016-02-28 NOTE — Progress Notes (Signed)
Adult Psychoeducational Group Note  Date:  02/28/2016 Time:  10:24 PM  Group Topic/Focus:  Wrap-Up Group:   The focus of this group is to help patients review their daily goal of treatment and discuss progress on daily workbooks.   Participation Level:  Active  Participation Quality:  Appropriate  Affect:  Anxious and Appropriate  Cognitive:  Appropriate  Insight: Appropriate  Engagement in Group:  Engaged  Modes of Intervention:  Discussion  Additional Comments:  Patient attended wrap-up group and expressed that his day was a 6.  His coping skills will be coloring, reading and talking with others.  Kem Hensen W Shonn Farruggia 02/28/2016, 10:24 PM

## 2016-02-28 NOTE — [facility] Group Notes (Signed)
[facility] LCSW Group Therapy 02/28/2016 1:15pm  Type of Therapy: Group Therapy- Feelings Around Relapse and Recovery  Pt did not attend, declined invitation.   Chad CordialLauren Carter, Theresia MajorsLCSWA [phone] 02/28/2016 6:11 PM

## 2016-02-28 NOTE — Progress Notes (Signed)
D    Pt is pleasant and cooperative   Pt reports some mild anxiety and said he slept well last night   He is compliant with treatment and participates in unit activities    He interacts appropriately but minimally with others A   Verbal support given   Medications administered and educated on same   Monitor for effectiveness    Q 15 min checks R   Pt is safe and receptive to verbal support

## 2016-02-28 NOTE — Progress Notes (Signed)
Data. Patient denies SI/HI/AVH. No responding to IS noted throughout shift. Patient interacting well with staff and other patients. On his self inventory, patient reports, 5/10 for depression and anxiety and 4/10 for hopelessness. His goal today is: "Being more social with other residents and not allowing myself to get lost in my thoughts." Action. Emotional support and encouragement offered. Education provided on medication, indications and side effect. Q 15 minute checks done for safety. Response. Safety on the unit maintained through 15 minute checks. Medications taken as prescribed. Attended groups. Remained calm and appropriate through out shift.

## 2016-02-28 NOTE — Progress Notes (Signed)
Patient ID: Jeffery [name], male   DOB: 1991/07/06, 25 y.o.   MRN: [id number] [facility] MD Progress Note  02/28/2016 5:53 PM Dolan Xia  MRN:  [id number] Subjective:  Patient reports he is feeling better compared to admission . Denies medication side effects. States that he has not felt as if people are whispering things to him during his sleep, which he had reported was occurring prior to admissions. He does continue to endorse some auditory hallucinations, but describes them as less intense, frequent. He continues to have a sensation of deja vu, stating that he often feels he has experienced "something exactly like this " before . He denies medication side effects. Objective : I have discussed case with treatment team and have met with patient . Patient is presenting with partial improvement- presents with an improved mood, a more relaxed demeanor,not irritable today, and more  reactive affect . As above today reports improving psychotic symptoms and seems less focused on these issues today. He is also less focused on gender identity issues, states he realizes he needs to be psychiatrically stable before making any determination as to whether to start hormonal treatment . Limited interaction with peers, some group participation. No disruptive behaviors on unit, going to some groups. At this time denies medication side effects  Principal Problem:  Major Depression with psychotic features  Diagnosis:   Patient Active Problem List   Diagnosis Date Noted  . Major depressive disorder, recurrent episode, severe, with psychosis ([place]) [F33.3] 02/25/2016  . Cannabis use disorder, severe, dependence ([place]) [F12.20] 02/25/2016  . Major depressive disorder, recurrent episode, severe, with psychotic behavior ([place]) [F33.3] 02/25/2016  . Male-to-male transgender person [F64.0] 09/23/2015  . History of chlamydia [Z86.19] 06/21/2014  . Bipolar disorder ([place]) [F31.9] 06/21/2014  . Anxiety [F41.9]  06/21/2014  . Cigarette smoker [Z72.0] 06/21/2014  . HIV disease ([place]) [B20] 06/20/2014  . History of syphilis [Z86.19] 06/20/2014  . Dyslipidemia [E78.5] 06/20/2014   Total Time spent with patient: 20 minutes    Past Medical History:  Past Medical History:  Diagnosis Date  . Anxiety   . Chlamydia   . Depression   . HIV infection ([place])   . Hyperlipidemia   . Hypertension    Pt went off meds on his own; has been monitored without any problems  . Syphilis 2013 and 2015   History reviewed. No pertinent surgical history. Family History:  Family History  Problem Relation Age of Onset  . Hypertension Mother    Social History:  History  Alcohol Use  . 0.0 oz/week    Comment: occasional     History  Drug Use  . Frequency: 7.0 times per week  . Types: Marijuana    Comment: Documented hx marijuana    Social History   Social History  . Marital status: Single    Spouse name: N/A  . Number of children: N/A  . Years of education: N/A   Social History Main Topics  . Smoking status: Light Tobacco Smoker    Packs/day: 0.10    Types: Cigarettes  . Smokeless tobacco: Never Used     Comment: 3 cigarettes a day  . Alcohol use 0.0 oz/week     Comment: occasional  . Drug use:     Frequency: 7.0 times per week    Types: Marijuana     Comment: Documented hx marijuana  . Sexual activity: Not Asked     Comment: given condoms   Other Topics Concern  . None  Social History Narrative  . None   Additional Social History:   Sleep: Good  Appetite:  Good  Current Medications: Current Facility-Administered Medications  Medication Dose Route Frequency Provider Last Rate Last Dose  . abacavir-dolutegravir-lamiVUDine (TRIUMEQ) 161-09-604 MG per tablet 1 tablet  1 tablet Oral Daily Jenne Campus, MD   1 tablet at 02/28/16 0745  . acetaminophen (TYLENOL) tablet 650 mg  650 mg Oral Q6H PRN Patrecia Pour, NP   650 mg at 02/28/16 0748  . bacitracin ointment 1 application  1  application Topical BID Patrecia Pour, NP   1 application at 54/09/81 1640  . darunavir-cobicistat (PREZCOBIX) 800-150 MG per tablet 1 tablet  1 tablet Oral Daily Jenne Campus, MD   1 tablet at 02/28/16 0745  . FLUoxetine (PROZAC) capsule 20 mg  20 mg Oral Daily Patrecia Pour, NP   20 mg at 02/28/16 0745  . LORazepam (ATIVAN) tablet 0.5 mg  0.5 mg Oral Q6H PRN Jenne Campus, MD      . OLANZapine (ZYPREXA) tablet 10 mg  10 mg Oral QHS Jenne Campus, MD   10 mg at 02/27/16 2130  . traZODone (DESYREL) tablet 50 mg  50 mg Oral QHS PRN Jenne Campus, MD        Lab Results:  Results for orders placed or performed during the hospital encounter of 02/25/16 (from the past 48 hour(s))  Lipid panel     Status: Abnormal   Collection Time: 02/27/16  7:02 AM  Result Value Ref Range   Cholesterol 266 (H) 0 - 200 mg/dL   Triglycerides 98 <150 mg/dL   HDL 46 >40 mg/dL   Total CHOL/HDL Ratio 5.8 RATIO   VLDL 20 0 - 40 mg/dL   LDL Cholesterol 200 (H) 0 - 99 mg/dL    Comment:        Total Cholesterol/HDL:CHD Risk Coronary Heart Disease Risk Table                     Men   Women  1/2 Average Risk   3.4   3.3  Average Risk       5.0   4.4  2 X Average Risk   9.6   7.1  3 X Average Risk  23.4   11.0        Use the calculated Patient Ratio above and the CHD Risk Table to determine the patient's CHD Risk.        ATP III CLASSIFICATION (LDL):  <100     mg/dL   Optimal  100-129  mg/dL   Near or Above                    Optimal  130-159  mg/dL   Borderline  160-189  mg/dL   High  >190     mg/dL   Very High Performed at [facility]   Prolactin     Status: Abnormal   Collection Time: 02/27/16  7:02 AM  Result Value Ref Range   Prolactin 33.4 (H) 4.0 - 15.2 ng/mL    Comment: (NOTE) Performed At: [facility] [place], [place] [id number] Lindon Romp MD AO:[id number] Performed at [facility]   Hemoglobin A1c     Status: None    Collection Time: 02/27/16  7:02 AM  Result Value Ref Range   Hgb A1c MFr Bld 5.5 4.8 - 5.6 %  Comment: (NOTE)         Pre-diabetes: 5.7 - 6.4         Diabetes: >6.4         Glycemic control for adults with diabetes: <7.0    Mean Plasma Glucose 111 mg/dL    Comment: (NOTE) Performed At: [facility] [place], [place] [id number] Lindon Romp MD OF:[id number] Performed at [facility]   TSH     Status: None   Collection Time: 02/27/16  7:02 AM  Result Value Ref Range   TSH 1.689 0.350 - 4.500 uIU/mL    Comment: Performed at [facility]    Blood Alcohol level:  Lab Results  Component Value Date   [facility] <5 54/98/2641    Metabolic Disorder Labs: Lab Results  Component Value Date   HGBA1C 5.5 02/27/2016   MPG 111 02/27/2016   Lab Results  Component Value Date   PROLACTIN 33.4 (H) 02/27/2016   Lab Results  Component Value Date   CHOL 266 (H) 02/27/2016   TRIG 98 02/27/2016   HDL 46 02/27/2016   CHOLHDL 5.8 02/27/2016   VLDL 20 02/27/2016   LDLCALC 200 (H) 02/27/2016   LDLCALC 186 (H) 09/23/2015    Physical Findings: AIMS: Facial and Oral Movements Muscles of Facial Expression: None, normal Lips and Perioral Area: None, normal Jaw: None, normal Tongue: None, normal,Extremity Movements Upper (arms, wrists, hands, fingers): None, normal Lower (legs, knees, ankles, toes): None, normal, Trunk Movements Neck, shoulders, hips: None, normal, Overall Severity Severity of abnormal movements (highest score from questions above): None, normal Incapacitation due to abnormal movements: None, normal Patient's awareness of abnormal movements (rate only patient's report): No Awareness, Dental Status Current problems with teeth and/or dentures?: No Does patient usually wear dentures?: No  CIWA:    COWS:     Musculoskeletal: Strength & Muscle Tone: within normal limits Gait & Station: normal Patient leans:  N/A  Psychiatric Specialty Exam: Physical Exam  ROS denies headache, denies chest pain, no shortness of breath, no nausea, no vomiting   Blood pressure 139/87, pulse (!) 53, temperature 97.9 F (36.6 C), temperature source Oral, resp. rate 16, height 5' 6.5" (1.689 m), weight 132 lb (59.9 kg), SpO2 100 %.Body mass index is 20.99 kg/m.  General Appearance: improved grooming   Eye Contact:  improved eye contact   Speech:  Normal Rate  Volume:  Normal  Mood:  states he is feeling better today  Affect:  less irritable, more reactive   Thought Process:  Linear  Orientation:  Other:  fully alert and attentive   Thought Content:  Describes improving hallucinations, less focused on delusions of thought insertion,which he described as whispers in his ear at night when he sleeps in order to make him have certain thoughts and behaviors the next day   Suicidal Thoughts:  No- at this time denies any suicidal or self injurious ideations , contracts for safety on unit  Homicidal Thoughts:  No  Memory:  recent and remote grossly intact   Judgement:  Improving   Insight:  Fair   Psychomotor Activity:  Normal  Concentration:  Concentration: Good and Attention Span: Good  Recall:  Good  Fund of Knowledge:  Good  Language:  Good  Akathisia:  NA  Handed:  Right  AIMS (if indicated):     Assets:  Desire for Improvement Resilience  ADL's:  Intact  Cognition:  WNL  Sleep:  Number of Hours: 6.15  Assessment - patient present with  Some further  improvement in mood, particularly presents better related and less irritable. Psychotic symptoms persist but have decreased in intensity. Tolerating medications well at this time .   Treatment Plan Summary: Daily contact with patient to assess and evaluate symptoms and progress in treatment, Medication management, Plan inpatient admission  and medications as below  Encourage group, milieu participation to work on coping skills and symptom reduction  Continue  Prozac 20 mgrs QDAY for depression  Continue Zyprexa 10 mgrs QHS for psychotic symptoms and mood disorder  Continue antiretroviral management for HIV management Treatment team working on disposition planning options  Neita Garnet, MD 02/28/2016, 5:53 PM

## 2016-02-28 NOTE — Progress Notes (Signed)
Recreation Therapy Notes  Date: 02/28/16 Time: 0945 Location: 300 Hall Group Room  Group Topic: Stress Management  Goal Area(s) Addresses:  Patient will verbalize importance of using healthy stress management.  Patient will identify positive emotions associated with healthy stress management.   Intervention: Stress Management  Activity :  Progressive Muscle Relaxation.  LRT introduced the technique of progressive muscle relaxation to patients.  Patients were to follow along as LRT read script to engaged in the technique.  Education:  Stress Management, Discharge Planning.   Education Outcome: Acknowledges edcuation/In group clarification offered/Needs additional education  Clinical Observations/Feedback: Pt did not attend group.    Caroll RancherMarjette Cassity Christian, LRT/CTRS    Caroll RancherLindsay, [name] A 02/28/2016 12:53 PM

## 2016-02-28 NOTE — Plan of Care (Signed)
Problem: Coping: Goal: Ability to verbalize feelings will improve Outcome: Progressing Patient was able to discuss his feelings of pain from his recent injuries. He was also able to discuss his feeling and deny SI, this shift.

## 2016-02-28 NOTE — Plan of Care (Signed)
Problem: Medication: Goal: Compliance with prescribed medication regimen will improve Outcome: Progressing Patient has taken all of his medications as prescribed this shift.

## 2016-02-29 NOTE — [facility] Group Notes (Signed)
[facility] LCSW Group Therapy  02/29/2016 10:00 until 11:00 AM  Type of Therapy:  Group Therapy  Participation Level:  Did Not Attend other than last several moments of group despite overhead announcement in addition to LCSW going into patient's room and encouraging him to attend.    Carney Bernatherine C Harrill, LCSW

## 2016-02-29 NOTE — Plan of Care (Signed)
Problem: Activity: Goal: Interest or engagement in activities will improve Outcome: Progressing Pt participates in unit activities Goal: Sleeping patterns will improve Outcome: Progressing Pt sleeps 6 to 7 hours for the past two nights  Problem: Safety: Goal: Periods of time without injury will increase Outcome: Progressing Pt has suffered no injuries since hospitalization

## 2016-02-29 NOTE — Progress Notes (Signed)
Patient ID: Jeffery [name], male   DOB: [birth year], 25 y.o.   MRN: [id number] [facility] MD Progress Note  02/29/2016 4:02 PM Angie Hogg  MRN:  [id number] Subjective:  Patient reports he is feeling better compared to admission . He reports decreasing hallucinations, being less concerned about them, and currently is more focused on disposition planning, states that he is interested in applying for disability, and is interested in finding out more about how to start this process . He denies medication side effects. Objective : I have reviewed chart notes  and have met with patient . Patient continues to present with improvement compared to admission presentation- he presents with improved mood and fuller range of affect, is better related and more interactive on unit, with peers. States hallucinations have decreased, does not appear internally preoccupied , and as noted is now more future oriented , focusing on disposition issues. No disruptive behaviors on unit, going to some groups. At this time denies medication side effects  Principal Problem:  Major Depression with psychotic features  Diagnosis:   Patient Active Problem List   Diagnosis Date Noted  . Major depressive disorder, recurrent episode, severe, with psychosis ([place]) [F33.3] 02/25/2016  . Cannabis use disorder, severe, dependence ([place]) [F12.20] 02/25/2016  . Major depressive disorder, recurrent episode, severe, with psychotic behavior ([place]) [F33.3] 02/25/2016  . Male-to-male transgender person [F64.0] 09/23/2015  . History of chlamydia [Z86.19] 06/21/2014  . Bipolar disorder ([place]) [F31.9] 06/21/2014  . Anxiety [F41.9] 06/21/2014  . Cigarette smoker [Z72.0] 06/21/2014  . HIV disease ([place]) [B20] 06/20/2014  . History of syphilis [Z86.19] 06/20/2014  . Dyslipidemia [E78.5] 06/20/2014   Total Time spent with patient: 20 minutes    Past Medical History:  Past Medical History:  Diagnosis Date  . Anxiety   . Chlamydia   .  Depression   . HIV infection ([place])   . Hyperlipidemia   . Hypertension    Pt went off meds on his own; has been monitored without any problems  . Syphilis 2013 and 2015   History reviewed. No pertinent surgical history. Family History:  Family History  Problem Relation Age of Onset  . Hypertension Mother    Social History:  History  Alcohol Use  . 0.0 oz/week    Comment: occasional     History  Drug Use  . Frequency: 7.0 times per week  . Types: Marijuana    Comment: Documented hx marijuana    Social History   Social History  . Marital status: Single    Spouse name: N/A  . Number of children: N/A  . Years of education: N/A   Social History Main Topics  . Smoking status: Light Tobacco Smoker    Packs/day: 0.10    Types: Cigarettes  . Smokeless tobacco: Never Used     Comment: 3 cigarettes a day  . Alcohol use 0.0 oz/week     Comment: occasional  . Drug use:     Frequency: 7.0 times per week    Types: Marijuana     Comment: Documented hx marijuana  . Sexual activity: Not Asked     Comment: given condoms   Other Topics Concern  . None   Social History Narrative  . None   Additional Social History:   Sleep: Good  Appetite:  Good  Current Medications: Current Facility-Administered Medications  Medication Dose Route Frequency Provider Last Rate Last Dose  . abacavir-dolutegravir-lamiVUDine (TRIUMEQ) 384-66-599 MG per tablet 1 tablet  1 tablet Oral Daily Felicita Gage A  Jaxyn Rout, MD   1 tablet at 02/29/16 0839  . acetaminophen (TYLENOL) tablet 650 mg  650 mg Oral Q6H PRN Patrecia Pour, NP   650 mg at 02/28/16 0748  . bacitracin ointment 1 application  1 application Topical BID Patrecia Pour, NP   1 application at 24/26/83 0840  . darunavir-cobicistat (PREZCOBIX) 800-150 MG per tablet 1 tablet  1 tablet Oral Daily Jenne Campus, MD   1 tablet at 02/29/16 0839  . FLUoxetine (PROZAC) capsule 20 mg  20 mg Oral Daily Patrecia Pour, NP   20 mg at 02/29/16 0839  .  LORazepam (ATIVAN) tablet 0.5 mg  0.5 mg Oral Q6H PRN Jenne Campus, MD   0.5 mg at 02/29/16 0841  . OLANZapine (ZYPREXA) tablet 10 mg  10 mg Oral QHS Jenne Campus, MD   10 mg at 02/28/16 2127  . traZODone (DESYREL) tablet 50 mg  50 mg Oral QHS PRN Jenne Campus, MD   50 mg at 02/28/16 2127    Lab Results:  No results found for this or any previous visit (from the past 48 hour(s)).  Blood Alcohol level:  Lab Results  Component Value Date   ETH <5 41/96/2229    Metabolic Disorder Labs: Lab Results  Component Value Date   HGBA1C 5.5 02/27/2016   MPG 111 02/27/2016   Lab Results  Component Value Date   PROLACTIN 33.4 (H) 02/27/2016   Lab Results  Component Value Date   CHOL 266 (H) 02/27/2016   TRIG 98 02/27/2016   HDL 46 02/27/2016   CHOLHDL 5.8 02/27/2016   VLDL 20 02/27/2016   LDLCALC 200 (H) 02/27/2016   LDLCALC 186 (H) 09/23/2015    Physical Findings: AIMS: Facial and Oral Movements Muscles of Facial Expression: None, normal Lips and Perioral Area: None, normal Jaw: None, normal Tongue: None, normal,Extremity Movements Upper (arms, wrists, hands, fingers): None, normal Lower (legs, knees, ankles, toes): None, normal, Trunk Movements Neck, shoulders, hips: None, normal, Overall Severity Severity of abnormal movements (highest score from questions above): None, normal Incapacitation due to abnormal movements: None, normal Patient's awareness of abnormal movements (rate only patient's report): No Awareness, Dental Status Current problems with teeth and/or dentures?: No Does patient usually wear dentures?: No  CIWA:    COWS:     Musculoskeletal: Strength & Muscle Tone: within normal limits Gait & Station: normal Patient leans: N/A  Psychiatric Specialty Exam: Physical Exam  ROS denies headache, denies chest pain, no shortness of breath, no nausea, no vomiting   Blood pressure (!) 151/96, pulse 75, temperature 98.1 F (36.7 C), temperature source  Oral, resp. rate 15, height 5' 6.5" (1.689 m), weight 132 lb (59.9 kg), SpO2 100 %.Body mass index is 20.99 kg/m.  General Appearance: improved grooming , better related   Eye Contact:  improved eye contact   Speech:  Normal Rate  Volume:  Normal  Mood:  Gradually improving   Affect:  More reactive, smiles appropriately at times   Thought Process:  Linear  Orientation:  Other:  fully alert and attentive   Thought Content:  Hallucinations reported as improving and does not appear internally preoccupied at this time  Suicidal Thoughts:  No- at this time denies any suicidal or self injurious ideations , contracts for safety on unit  Homicidal Thoughts:  No  Memory:  recent and remote grossly intact   Judgement:  Improving   Insight:  Fair   Psychomotor Activity:  Normal  Concentration:  Concentration: Good and Attention Span: Good  Recall:  Good  Fund of Knowledge:  Good  Language:  Good  Akathisia:  NA  Handed:  Right  AIMS (if indicated):     Assets:  Desire for Improvement Resilience  ADL's:  Intact  Cognition:  WNL  Sleep:  Number of Hours: 6   Assessment - patient is improving gradually, with improving mood, range of affect, more socialization, and decreasing focus on psychotic symptoms, hallucinations,  which he report are chronic and present most of the time. He is tolerating medications  well at this time , and is more future oriented at this time   Treatment Plan Summary: Daily contact with patient to assess and evaluate symptoms and progress in treatment, Medication management, Plan inpatient admission  and medications as below  Encourage group, milieu participation to work on coping skills and symptom reduction  Continue Prozac 20 mgrs QDAY for depression  Continue Zyprexa 10 mgrs QHS for psychotic symptoms and mood disorder  Continue antiretroviral management for HIV management Treatment team working on disposition planning options  Neita Garnet, MD 02/29/2016,  4:02 PM

## 2016-02-29 NOTE — Progress Notes (Signed)
D    Pt is pleasant and cooperative   Pt reports some mild anxiety and said he slept well last night   He is compliant with treatment and participates in unit activities    He interacts appropriately but minimally with others A   Verbal support given   Medications administered and educated on same   Monitor for effectiveness    Q 15 min checks R   Pt is safe and receptive to verbal support 

## 2016-02-29 NOTE — Progress Notes (Signed)
Data. Patient denies SI/HI/VH. He does report AH of, "Background noise. Though it is much better than is was." Patient is also having some difficulty with the timeline of his stay here-he is [facility] to [facility]Patient interacting well with staff and other patients. On his self assessment he reports, 4/10 for depression and hopelessness and 5/10 for anxiey. His goal today is, "Not isolating myself or allowing my anxiety to bother me." Action. Emotional support and encouragement offered. Education provided on medication, indications and side effect. Q 15 minute checks done for safety. Response. Safety on the unit maintained through 15 minute checks.  Medications taken as prescribed. Attended groups. Remained calm and appropriate through out shift.

## 2016-02-29 NOTE — [facility] Group Notes (Signed)
[facility] Group Notes:  (Nursing/MHT/Case Management/Adjunct)  Date:  02/29/2016  Time:  0930  Type of Therapy:  Nurse Education - Coping Skills and Strategies  Participation Level:  Did Not Attend  Participation Quality:    Affect:    Cognitive:    Insight:    Engagement in Group:    Modes of Intervention:    Summary of Progress/Problems: Patient invited to group however did not attend.  Merian CapronFriedman, Tyller Bowlby [facility]Eakes 02/29/2016, 1000

## 2016-02-29 NOTE — Progress Notes (Signed)
Adult Psychoeducational Group Note  Date:  02/29/2016 Time:  9:02 PM  Group Topic/Focus:  Wrap-Up Group:   The focus of this group is to help patients review their daily goal of treatment and discuss progress on daily workbooks.   Participation Level:  Active  Participation Quality:  Appropriate, Attentive and Supportive  Affect:  Appropriate  Cognitive:  Alert and Appropriate  Insight: Appropriate  Engagement in Group:  Engaged and Supportive  Modes of Intervention:  Discussion  Additional Comments:  Patient identified his goal as increasing his interaction with others.  Patient confirmed talking and playing games.  Patient identified his coping skill as writing and engaging in outside activities.    Elmore GuiseSLOAN, Jayr Lupercio N 02/29/2016, 9:02 PM

## 2016-03-01 MED ORDER — OLANZAPINE 7.5 MG PO TABS
15.0000 mg | ORAL_TABLET | Freq: Every day | ORAL | Status: DC
  Administered 2016-03-01: 15 mg via ORAL
  Filled 2016-03-01: qty 14
  Filled 2016-03-01 (×3): qty 2

## 2016-03-01 NOTE — [facility] Group Notes (Signed)
[facility] Group Notes:  (Nursing/MHT/Case Management/Adjunct)  Date:  03/01/2016  Time:  10:32 AM  Type of Therapy:  Nurse Education  Participation Level:  Active  Participation Quality:  Appropriate  Affect:  Appropriate  Cognitive:  Appropriate and Oriented  Insight:  Appropriate  Engagement in Group:  Engaged  Modes of Intervention:  Discussion, Education and Socialization  Summary of Progress/Problems: Kathlene NovemberMike shared that his goal is to not isolate as much.   Maurine SimmeringShugart, Tiaria Biby M 03/01/2016, 10:32 AM

## 2016-03-01 NOTE — Progress Notes (Signed)
Adult Psychoeducational Group Note  Date:  03/01/2016 Time:  10:26 PM  Group Topic/Focus:  Wrap-Up Group:   The focus of this group is to help patients review their daily goal of treatment and discuss progress on daily workbooks.   Participation Level:  Active  Participation Quality:  Appropriate  Affect:  Appropriate  Cognitive:  Alert, Appropriate and Oriented  Insight: Appropriate  Engagement in Group:  Engaged  Modes of Intervention:  Discussion  Additional Comments:  Patient attended wrap-up group and said his day was a 5. His goal for today was to socialize and he accomplished that goal.  His coping skills were reading and socializing. Kenedee Molesky W Maximiliano Cromartie 03/01/2016, 10:26 PM

## 2016-03-01 NOTE — Plan of Care (Signed)
Problem: Coping: Goal: Ability to verbalize feelings will improve Outcome: Progressing Patient is speaking more openly and in more detail, about his thought process and his feelings.

## 2016-03-01 NOTE — Progress Notes (Signed)
Data. Patient slept in this AM and took his medications late. He denies SI/HI/AVH. He does report having frequent thoughts, and reliving, "Stuff my family has said to me". Patient interacting well with staff and other patients. He C/O pain in his right shoulder after playing basketball on the patio and received  PRN pain medication.  On his self inventory  He reprts 3/10 for depression, 4/10 for hopelessness and anxiety. His goal today is: "Coming with a discharge plan." Action. PRN pain medication effective.  Emotional support and encouragement offered. Education provided on medication, indications and side effect. Q 15 minute checks done for safety. Response. Safety on the unit maintained through 15 minute checks.  Medications taken as prescribed. Attended groups. Remained calm and appropriate through out shift.

## 2016-03-01 NOTE — Plan of Care (Signed)
Problem: Medication: Goal: Compliance with prescribed medication regimen will improve Outcome: Progressing Patient came late for his morning medications, but he did take them without difficulty.

## 2016-03-01 NOTE — [facility] Group Notes (Signed)
    [facility] LCSW Group Therapy  03/01/2016 10:00 until 10:55 AM  Type of Therapy:  Group Therapy  Participation Level:  Active  Participation Quality:  Appropriate  Affect:  Appropriate  Cognitive:  Appropriate  Insight:  Developing/Improving  Engagement in Therapy:  Developing/Improving  Modes of Intervention:  Discussion, Rapport Building, Socialization and Support  Summary of Progress/Problems: Topic for today was thoughts and feelings regarding discharge. We discussed fears of upcoming changes including judgements, expectations and stigma of mental health issues. We then discussed supports: what constitutes a supportive framework, identification of supports and what to do when others are not supportive.  Patients then processed the concept of "It's only a thought, and a thought can be changed." Patient was open to processing his tendency to keep things to himself and spend time with more supportive people verses patterns of isolation and negative peers.    Carney Bernatherine C Jakeria Caissie, LCSW

## 2016-03-01 NOTE — Progress Notes (Addendum)
Patient ID: Jeffery [name], male   DOB: 08-03-1990, 25 y.o.   MRN: [id number] [facility] MD Progress Note  03/01/2016 5:46 PM Humphrey Guerreiro  MRN:  [id number] Subjective:  Patient reports that he is feeling better than on admission- he states he still hears voices, but states " I guess I can ignore them better, like I am hearing them a little bit even when I talk to you". No longer focused on delusional, thought insertion ideations . He denies medication side effects. He complains of some wrist pain, lower back pain, which he feels are related to having been restrained by police prior to coming to hospital, but denies any functional limitation, and change of range of motion,  or decreased mobility. Objective : I have reviewed chart notes  and have met with patient . He is much improved compared to admission , and presents better related, pleasant, smiling at times appropriately, with a fuller range of affect . Denies any suicidal ideations. Behavior on unit calm, in good control, visible in unit, participating in groups. He reports that he is tolerating medications well , at this time denies medication side effects- no akathisia noted or reported . With patient's express consent and in his presence I spoke with his mother on the phone- he lives with her and she has come to visit him on unit- she provided corroboration that he seems improved, better, brighter in affect. States that he had been guarded and paranoid prior to admission, making it more difficult for him to participate in church and other family activities . She does not feel he is back to baseline yet, however, and states she feels he may still have some paranoia. She confirms he can return home to live with her on discharge. At this time denies medication side effects Wrist examined- minimally painful, no visible  inflammation, full range of motion.   Principal Problem:  Major Depression with psychotic features  Diagnosis:   Patient Active  Problem List   Diagnosis Date Noted  . Major depressive disorder, recurrent episode, severe, with psychosis ([place]) [F33.3] 02/25/2016  . Cannabis use disorder, severe, dependence ([place]) [F12.20] 02/25/2016  . Major depressive disorder, recurrent episode, severe, with psychotic behavior ([place]) [F33.3] 02/25/2016  . Male-to-male transgender person [F64.0] 09/23/2015  . History of chlamydia [Z86.19] 06/21/2014  . Bipolar disorder ([place]) [F31.9] 06/21/2014  . Anxiety [F41.9] 06/21/2014  . Cigarette smoker [Z72.0] 06/21/2014  . HIV disease ([place]) [B20] 06/20/2014  . History of syphilis [Z86.19] 06/20/2014  . Dyslipidemia [E78.5] 06/20/2014   Total Time spent with patient: 20 minutes    Past Medical History:  Past Medical History:  Diagnosis Date  . Anxiety   . Chlamydia   . Depression   . HIV infection ([place])   . Hyperlipidemia   . Hypertension    Pt went off meds on his own; has been monitored without any problems  . Syphilis 2013 and 2015   History reviewed. No pertinent surgical history. Family History:  Family History  Problem Relation Age of Onset  . Hypertension Mother    Social History:  History  Alcohol Use  . 0.0 oz/week    Comment: occasional     History  Drug Use  . Frequency: 7.0 times per week  . Types: Marijuana    Comment: Documented hx marijuana    Social History   Social History  . Marital status: Single    Spouse name: N/A  . Number of children: N/A  . Years of education:  N/A   Social History Main Topics  . Smoking status: Light Tobacco Smoker    Packs/day: 0.10    Types: Cigarettes  . Smokeless tobacco: Never Used     Comment: 3 cigarettes a day  . Alcohol use 0.0 oz/week     Comment: occasional  . Drug use:     Frequency: 7.0 times per week    Types: Marijuana     Comment: Documented hx marijuana  . Sexual activity: Not Asked     Comment: given condoms   Other Topics Concern  . None   Social History Narrative  . None   Additional  Social History:   Sleep: Good  Appetite:  Good  Current Medications: Current Facility-Administered Medications  Medication Dose Route Frequency Provider Last Rate Last Dose  . abacavir-dolutegravir-lamiVUDine (TRIUMEQ) 517-00-174 MG per tablet 1 tablet  1 tablet Oral Daily Jenne Campus, MD   1 tablet at 03/01/16 [phone]  . acetaminophen (TYLENOL) tablet 650 mg  650 mg Oral Q6H PRN Patrecia Pour, NP   650 mg at 03/01/16 1636  . bacitracin ointment 1 application  1 application Topical BID Patrecia Pour, NP   1 application at 67/59/16 [phone]  . darunavir-cobicistat (PREZCOBIX) 800-150 MG per tablet 1 tablet  1 tablet Oral Daily Jenne Campus, MD   1 tablet at 03/01/16 0929  . FLUoxetine (PROZAC) capsule 20 mg  20 mg Oral Daily Patrecia Pour, NP   20 mg at 03/01/16 0929  . LORazepam (ATIVAN) tablet 0.5 mg  0.5 mg Oral Q6H PRN Jenne Campus, MD   0.5 mg at 02/29/16 0841  . OLANZapine (ZYPREXA) tablet 10 mg  10 mg Oral QHS Jenne Campus, MD   10 mg at 02/29/16 2114  . traZODone (DESYREL) tablet 50 mg  50 mg Oral QHS PRN Jenne Campus, MD   50 mg at 02/29/16 2114    Lab Results:  No results found for this or any previous visit (from the past 78 hour(s)).  Blood Alcohol level:  Lab Results  Component Value Date   ETH <5 65/99/3570    Metabolic Disorder Labs: Lab Results  Component Value Date   HGBA1C 5.5 02/27/2016   MPG 111 02/27/2016   Lab Results  Component Value Date   PROLACTIN 33.4 (H) 02/27/2016   Lab Results  Component Value Date   CHOL 266 (H) 02/27/2016   TRIG 98 02/27/2016   HDL 46 02/27/2016   CHOLHDL 5.8 02/27/2016   VLDL 20 02/27/2016   LDLCALC 200 (H) 02/27/2016   LDLCALC 186 (H) 09/23/2015    Physical Findings: AIMS: Facial and Oral Movements Muscles of Facial Expression: None, normal Lips and Perioral Area: None, normal Jaw: None, normal Tongue: None, normal,Extremity Movements Upper (arms, wrists, hands, fingers): None, normal Lower  (legs, knees, ankles, toes): None, normal, Trunk Movements Neck, shoulders, hips: None, normal, Overall Severity Severity of abnormal movements (highest score from questions above): None, normal Incapacitation due to abnormal movements: None, normal Patient's awareness of abnormal movements (rate only patient's report): No Awareness, Dental Status Current problems with teeth and/or dentures?: No Does patient usually wear dentures?: No  CIWA:    COWS:     Musculoskeletal: Strength & Muscle Tone: within normal limits Gait & Station: normal Patient leans: N/A  Psychiatric Specialty Exam: Physical Exam  ROS denies headache, denies chest pain, no shortness of breath, no nausea, no vomiting , some mild back pain and wrist pain, no range of  motion limitations  Blood pressure (!) 149/101, pulse 69, temperature 98.1 F (36.7 C), temperature source Oral, resp. rate 16, height 5' 6.5" (1.689 m), weight 132 lb (59.9 kg), SpO2 100 %.Body mass index is 20.99 kg/m.  General Appearance: improved grooming , better related   Eye Contact: good   Speech:  Normal Rate  Volume:  Normal  Mood:  Improved and currently denies depression   Affect:  reactive, smiles appropriately at times   Thought Process:  Linear  Orientation:  Other:  fully alert and attentive   Thought Content:  Hallucinations reported as improving and does not appear internally preoccupied at this time At this time does not appear overtly  guarded or paranoid   Suicidal Thoughts:  No- at this time denies any suicidal or self injurious ideations , contracts for safety on unit  Homicidal Thoughts:  No  Memory:  recent and remote grossly intact   Judgement:  Improving   Insight:  Improving    Psychomotor Activity:  Normal  Concentration:  Concentration: Good and Attention Span: Good  Recall:  Good  Fund of Knowledge:  Good  Language:  Good  Akathisia:  NA  Handed:  Right  AIMS (if indicated):     Assets:  Desire for  Improvement Resilience  ADL's:  Intact  Cognition:  WNL  Sleep:  Number of Hours: 6   Assessment - patient has improved significantly compared to admission , and mother corroborates improvement . He is tolerating Zyprexa and Prozac well, no side effects. He reports some residual , ongoing hallucinations, but is less focused on these and does not appear internally preoccupied, no delusions are expressed today- mother states he seems improved but still vaguely paranoid . Patient agrees to titrating Zyprexa dose further to address residual symptoms.   Treatment Plan Summary: Daily contact with patient to assess and evaluate symptoms and progress in treatment, Medication management, Plan inpatient admission  and medications as below  Encourage group, milieu participation to work on coping skills and symptom reduction  Continue Prozac 20 mgrs QDAY for depression  Increase Zyprexa to 15  mgrs QHS for psychotic symptoms and mood disorder  Continue antiretroviral management for HIV management Treatment team working on disposition planning options  Neita Garnet, MD 03/01/2016, 5:46 PM

## 2016-03-02 DIAGNOSIS — F332 Major depressive disorder, recurrent severe without psychotic features: Secondary | ICD-10-CM

## 2016-03-02 DIAGNOSIS — Z79899 Other long term (current) drug therapy: Secondary | ICD-10-CM

## 2016-03-02 DIAGNOSIS — Z8249 Family history of ischemic heart disease and other diseases of the circulatory system: Secondary | ICD-10-CM

## 2016-03-02 DIAGNOSIS — F17210 Nicotine dependence, cigarettes, uncomplicated: Secondary | ICD-10-CM

## 2016-03-02 DIAGNOSIS — F1210 Cannabis abuse, uncomplicated: Secondary | ICD-10-CM

## 2016-03-02 MED ORDER — DARUNAVIR-COBICISTAT 800-150 MG PO TABS: ORAL_TABLET | ORAL | 0 refills | Status: DC

## 2016-03-02 MED ORDER — BACITRACIN ZINC 500 UNIT/GM EX OINT: 1.0000 "application " | TOPICAL_OINTMENT | Freq: Two times a day (BID) | CUTANEOUS | 0 refills | Status: DC

## 2016-03-02 MED ORDER — TRAZODONE HCL 50 MG PO TABS: 50.0000 mg | ORAL_TABLET | Freq: Every evening | ORAL | 0 refills | Status: DC | PRN

## 2016-03-02 MED ORDER — FLUOXETINE HCL 20 MG PO CAPS: 20.0000 mg | ORAL_CAPSULE | Freq: Every day | ORAL | 0 refills | Status: DC

## 2016-03-02 MED ORDER — ABACAVIR-DOLUTEGRAVIR-LAMIVUD 600-50-300 MG PO TABS: 1.0000 | ORAL_TABLET | Freq: Every day | ORAL | 0 refills | Status: DC

## 2016-03-02 MED ORDER — OLANZAPINE 15 MG PO TABS: 15.0000 mg | ORAL_TABLET | Freq: Every day | ORAL | 0 refills | Status: DC

## 2016-03-02 NOTE — Progress Notes (Signed)
D: Pt was in the day room upon initial approach.  Pt has depressed affect and mood.  His daily goal was "not isolating myself as much."  Pt reports he has accomplished his goal.  Pt denies SI/HI, reports auditory hallucinations and describes them as "kind of flashbacks."   Pt has been visible in milieu interacting with peers and staff cautiously.  Pt attended evening group.   A: Introduced self to pt.  Met with pt and offered support and encouragement.  Actively listened to pt.  Medication administered per order.  PRN medication administered for sleep. R: Pt is compliant with medications.  Pt verbally contracts for safety.  Will continue to monitor and assess.

## 2016-03-02 NOTE — Progress Notes (Signed)
Patient ID: Jeffery ParrMichael Lagan, male   DOB: [birth year], 25 y.o.   MRN: [id number] Discharge note:  Patient discharged home per MD order. Patient received all personal belongings from unit and locker.  Reviewed discharge instructions and medications with patient and he indicated understanding.  Patient received samples of his medications, along with appropriate prescriptions.  He denies any thoughts of self harm.  [facility].  Patient left ambulatory with family.

## 2016-03-02 NOTE — Progress Notes (Signed)
  [facility] Discharge [facility] :  Will you be returning to the same living situation after discharge:  Yes,  Pt returning to mother's house At discharge, do you have transportation home?: Yes,  mother to pick up Do you have the ability to pay for your medications: Yes,  Pt provided with prescriptions and samples  Release of information consent forms completed and in the chart;  Patient's signature needed at discharge.  Patient to Follow up at: Follow-up Information    [facility] Follow up on 04/01/2016.   Specialty:  Behavioral Health Why:  Next available appointment w your provider, Philis NettleChester Hairston, is  on 9/27 at 11 AM.  Please use [facility] Monday - Friday for any needs prior to that appointment.  Bring hospital discharge paperwork to this appointment or to [facility]. Contact information: [address] [place] [address] [phone]           Next level of care provider has access to [facility] Link:no  Safety Planning and Suicide Prevention discussed: Yes,  with mother; see SPE note  Have you used any form of tobacco in the last 30 days? (Cigarettes, Smokeless Tobacco, Cigars, and/or Pipes): Yes  Has patient been referred to the Quitline?: Patient refused referral  Patient has been referred for addiction treatment: Yes  Deeann Servidio Lavonna RuaM [facility] 03/02/2016, 11:45 AM

## 2016-03-02 NOTE — Discharge Summary (Signed)
Physician Discharge Summary Note  Patient:  Jeffery [name] is an 25 y.o., male MRN:  [id number] DOB:  1990/10/10 Patient phone:  [phone] (home)  Patient address:   [name],  Total Time spent with patient: Greater than 30 minutes  Date of Admission:  02/25/2016  Date of Discharge: 03-02-16  Reason for Admission: Worsening symptoms of depression/anxiety.  Principal Problem: Major depressive disorder, recurrent episode, severe, Cannabis use disorder.  Discharge Diagnoses: Patient Active Problem List   Diagnosis Date Noted  . Major depressive disorder, recurrent episode, severe, with psychosis (HCC) [F33.3] 02/25/2016  . Cannabis use disorder, severe, dependence (HCC) [F12.20] 02/25/2016  . Major depressive disorder, recurrent episode, severe, with psychotic behavior (HCC) [F33.3] 02/25/2016  . Male-to-male transgender person [F64.0] 09/23/2015  . History of chlamydia [Z86.19] 06/21/2014  . Bipolar disorder (HCC) [F31.9] 06/21/2014  . Anxiety [F41.9] 06/21/2014  . Cigarette smoker [Z72.0] 06/21/2014  . HIV disease (HCC) [B20] 06/20/2014  . History of syphilis [Z86.19] 06/20/2014  . Dyslipidemia [E78.5] 06/20/2014   Past Psychiatric History: Major depression, Cannabis dependence  Past Medical History:  Past Medical History:  Diagnosis Date  . Anxiety   . Chlamydia   . Depression   . HIV infection (HCC)   . Hyperlipidemia   . Hypertension    Pt went off meds on his own; has been monitored without any problems  . Syphilis 2013 and 2015   History reviewed. No pertinent surgical history.  Family History:  Family History  Problem Relation Age of Onset  . Hypertension Mother    Family Psychiatric  History: See H&P  Social History:  History  Alcohol Use  . 0.0 oz/week    Comment: occasional     History  Drug Use  . Frequency: 7.0 times per week  . Types: Marijuana    Comment: Documented hx marijuana    Social  History   Social History  . Marital status: Single    Spouse name: N/A  . Number of children: N/A  . Years of education: N/A   Social History Main Topics  . Smoking status: Light Tobacco Smoker    Packs/day: 0.10    Types: Cigarettes  . Smokeless tobacco: Never Used     Comment: 3 cigarettes a day  . Alcohol use 0.0 oz/week     Comment: occasional  . Drug use:     Frequency: 7.0 times per week    Types: Marijuana     Comment: Documented hx marijuana  . Sexual activity: Not Asked     Comment: given condoms   Other Topics Concern  . None   Social History Narrative  . None   Hospital Course: 25 year old male (of note, he states that he is interested in changing gender, but currently identifies self as " still  a male" and provides name as Jeffery [name] ) , states that for several months he has been depressed , sad , but states " I felt like I had a crisis a couple of days ago", which he describes a more severe depression, anxiety, and suicidal ideations , although without a specific plan . States " I knew I needed help to prevent from hurting myself ". Patient describes neuro-vegetative symptoms as below. Of note, patient also reports psychotic symptoms, which he describes as hearing commentaries on his activities and life , and a belief that family members speak to him while asleep to plant these ideations in him.  Jeffery [name] was  admitted to the [facility] adult unit with his UDS test results positive for THC. His reason for admission was worsening symptoms of depression with psychotic features.  He is a male gender in the process of a male gender transition. He presented with severe depression & high anxiety levels. He was in need of mood stabilization treatments. After his admission assessment/ evaluation, his presenting symptoms were identified. The medication regimen targeting those symptoms were discussed & initiated. Amro was medicated & discharged on; Fluoxetine 20 mg for depression,  Olanzapine 15 mg for mood control & Trazodone 50 mg for insomnia. He was resumed on all his pertinent home medications for his other pre-existing medical issues. He was also enrolled & participated in the group counseling sessions being offered & held on this unit. He learned coping skills that should help him further to cope better & manage his depression/anxiety after discharge.   Dedrick's symptoms responded well to his treatment regimen & his mood is now stable. This is evidenced by his reports of improved mood, absence of suicidal ideations & psychotic symptoms. He is currently being discharged to continue further mental health treatment on an outpatient basis as noted below. He is provided with all the pertinent information needed to make this appointment without problems.  Upon discharge, [name] adamantly denies any SIHI, AVH, delusional thoughts, paranoia or substance withdrawal symptoms. He is provided with a 7 days worth, supply samples of his [facility] discharge medications. He left [facility] with all personal belongings in no apparent distress. Transportation per mother.  Physical Findings: AIMS: Facial and Oral Movements Muscles of Facial Expression: None, normal Lips and Perioral Area: None, normal Jaw: None, normal Tongue: None, normal,Extremity Movements Upper (arms, wrists, hands, fingers): None, normal Lower (legs, knees, ankles, toes): None, normal, Trunk Movements Neck, shoulders, hips: None, normal, Overall Severity Severity of abnormal movements (highest score from questions above): None, normal Incapacitation due to abnormal movements: None, normal Patient's awareness of abnormal movements (rate only patient's report): No Awareness, Dental Status Current problems with teeth and/or dentures?: No Does patient usually wear dentures?: No  CIWA:    COWS:     Musculoskeletal: Strength & Muscle Tone: within normal limits Gait & Station: normal Patient leans: N/A  Psychiatric  Specialty Exam: Physical Exam  Constitutional: He appears well-developed.  HENT:  Head: Normocephalic.  Eyes: Pupils are equal, round, and reactive to light.  Neck: Normal range of motion.  Cardiovascular: Normal rate.   Respiratory: Effort normal.  GI: Soft.  Genitourinary:  Genitourinary Comments: Denies any issues in this area  Musculoskeletal: Normal range of motion.  Neurological: He is alert.  Skin: Skin is warm.    Review of Systems  Constitutional: Negative.   HENT: Negative.   Eyes: Negative.   Respiratory: Negative.   Cardiovascular: Negative.   Gastrointestinal: Negative.   Genitourinary: Negative.   Musculoskeletal: Negative.   Skin: Negative.   Neurological: Negative.   Endo/Heme/Allergies: Negative.   Psychiatric/Behavioral: Positive for depression (Stable) and substance abuse (Cannabis use disorder). Negative for hallucinations, memory loss and suicidal ideas. The patient has insomnia (Stable). The patient is not nervous/anxious.     Blood pressure (!) 147/93, pulse 63, temperature 98.7 F (37.1 C), temperature source Oral, resp. rate 16, height 5' 6.5" (1.689 m), weight 59.9 kg (132 lb), SpO2 100 %.Body mass index is 20.99 kg/m.  See Md's SRA  Have you used any form of tobacco in the last 30 days? (Cigarettes, Smokeless Tobacco, Cigars, and/or Pipes): Yes  Has this patient  used any form of tobacco in the last 30 days? (Cigarettes, Smokeless Tobacco, Cigars, and/or Pipes) Yes, No  Blood Alcohol level:  Lab Results  Component Value Date   ETH <5 02/24/2016   Metabolic Disorder Labs:  Lab Results  Component Value Date   HGBA1C 5.5 02/27/2016   MPG 111 02/27/2016   Lab Results  Component Value Date   PROLACTIN 33.4 (H) 02/27/2016   Lab Results  Component Value Date   CHOL 266 (H) 02/27/2016   TRIG 98 02/27/2016   HDL 46 02/27/2016   CHOLHDL 5.8 02/27/2016   VLDL 20 02/27/2016   LDLCALC 200 (H) 02/27/2016   LDLCALC 186 (H) 09/23/2015   See  Psychiatric Specialty Exam and Suicide Risk Assessment completed by Attending Physician prior to discharge.  Discharge destination:  Home  Is patient on multiple antipsychotic therapies at discharge:  No   Has Patient had three or more failed trials of antipsychotic monotherapy by history:  No  Recommended Plan for Multiple Antipsychotic Therapies: NA    Medication List    TAKE these medications     Indication  abacavir-dolutegravir-lamiVUDine 600-50-300 MG tablet Commonly known as:  TRIUMEQ Take 1 tablet by mouth daily. For HIV infection What changed:  additional instructions  Indication:  HIV Disease   bacitracin ointment Apply 1 application topically 2 (two) times daily. For wound care  Indication:  Wound care   darunavir-cobicistat 800-150 MG tablet Commonly known as:  PREZCOBIX TAKE 1 TABLET BY MOUTH DAILY WITH FOOD. SWALLOW WHOLE, DO NOT CRUSH, BREAK, OR CHEW TABLETS: For HIV infection What changed:  additional instructions  Indication:  HIV Disease   FLUoxetine 20 MG capsule Commonly known as:  PROZAC Take 1 capsule (20 mg total) by mouth daily. For depression  Indication:  Depression   OLANZapine 15 MG tablet Commonly known as:  ZYPREXA Take 1 tablet (15 mg total) by mouth at bedtime. For mood control  Indication:  Mood control   traZODone 50 MG tablet Commonly known as:  DESYREL Take 1 tablet (50 mg total) by mouth at bedtime as needed for sleep.  Indication:  Trouble Sleeping      Follow-up Information    [facility] Follow up on 04/01/2016.   Specialty:  Behavioral Health Why:  Next available appointment w your provider, Philis Nettle, is  on 9/27 at 11 AM.  Please use [facility] - 3 Monday - Friday for any needs prior to that appointment.  Bring hospital discharge paperwork to this appointment or to [facility]. Contact [address] [place] [address] [phone]          Follow-up recommendations: Activity:  As  tolerated Diet: As recommended by your primary care doctor. Keep all scheduled follow-up appointments as recommended.  Comments: Patient is instructed prior to discharge to: Take all medications as prescribed by his/her mental healthcare provider. Report any adverse effects and or reactions from the medicines to his/her outpatient provider promptly. Patient has been instructed & cautioned: To not engage in alcohol and or illegal drug use while on prescription medicines. In the event of worsening symptoms, patient is instructed to call the crisis hotline, 911 and or go to the nearest ED for appropriate evaluation and treatment of symptoms. To follow-up with his/her primary care provider for your other medical issues, concerns and or health care needs.  Signed: Sanjuana Kava, NP, PMHNP, FNP-BC 03/02/2016, 3:46 PM

## 2016-03-02 NOTE — Progress Notes (Signed)
Recreation Therapy Notes  Date: 03/02/16 Time: 0930 Location: 300 Hall Dayroom  Group Topic: Stress Management  Goal Area(s) Addresses:  Patient will verbalize importance of using healthy stress management.  Patient will identify positive emotions associated with healthy stress management.   Intervention: Stress Management  Activity :  [place].  LRT introduced to the technique of guided imagery to the patients.  Patients were to follow along as LRT read script in order to participate in the the technique.  Education: Stress Management, Discharge Planning.   Education Outcome: Acknowledges edcuation/In group clarification offered/Needs additional education  Clinical Observations/Feedback: Pt did not attend group.    Caroll RancherMarjette Lakoda Mcanany, LRT/CTRS     Caroll RancherLindsay, Kendelle Schweers A 03/02/2016 12:50 PM
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
## 2016-03-02 NOTE — [facility] Group Notes (Signed)
[facility]   03/02/2016 12:59 PM  Participation Quality:  Invited, did not attend.  Sallee LangeAnne C Reshma Hoey

## 2016-03-02 NOTE — B[facility] Suicide Risk Assessment (Signed)
[facility] INPATIENT:  Family/Significant Other Suicide Prevention Education  Suicide Prevention Education:  Education Completed; Laurette SchimkeLisa Ciliberto, Pt's mother [phone], has been identified by the patient as the family member/significant other with whom the patient will be residing, and identified as the person(s) who will aid the patient in the event of a mental health crisis (suicidal ideations/suicide attempt).  With written consent from the patient, the family member/significant other has been provided the following suicide prevention education, prior to the and/or following the discharge of the patient.  The suicide prevention education provided includes the following:  Suicide risk factors  Suicide prevention and interventions  National Suicide Hotline telephone number  [facility] assessment telephone number  [facility] Emergency Assistance 911  [facility] and/or Residential Mobile Crisis Unit telephone number  Request made of family/significant other to:  Remove weapons (e.g., guns, rifles, knives), all items previously/currently identified as safety concern.    Remove drugs/medications (over-the-counter, prescriptions, illicit drugs), all items previously/currently identified as a safety concern.  The family member/significant other verbalizes understanding of the suicide prevention education information provided.  The family member/significant other agrees to remove the items of safety concern listed above.  Elaina HoopsLauren M Carter 03/02/2016, 12:18 PM

## 2016-03-02 NOTE — Plan of Care (Signed)
Problem: Health Behavior/Discharge Planning: Goal: Compliance with prescribed medication regimen will improve Outcome: Progressing Pt has been compliant with scheduled medication tonight.     

## 2016-03-02 NOTE — Tx Team (Signed)
Interdisciplinary Treatment Plan Update (Adult) Date: 03/02/2016   Date: 03/02/2016 11:44 AM  Progress in Treatment:  Attending groups: Minimally Participating in groups: Minimally  Taking medication as prescribed: Yes  Tolerating medication: Yes  Family/Significant othe contact made: No, CSW attempting to make contact with mother Patient understands diagnosis: Improving insight Discussing patient identified problems/goals with staff: Yes  Medical problems stabilized or resolved: Yes  Denies suicidal/homicidal ideation: Yes Patient has not harmed self or Others: Yes   New problem(s) identified: None identified at this time.   Discharge Plan or Barriers: Pt will return home and follow-up with outpatient services.   Additional comments:  Patient and CSW reviewed pt's identified goals and treatment plan. Patient verbalized understanding and agreed to treatment plan.   Reason for Continuation of Hospitalization:  Depression Medication stabilization Suicidal ideation Hallucinations  Estimated length of stay: 0 days  Review of initial/current patient goals per problem list:   1.  Goal(s): Patient will participate in aftercare plan  Met:  Yes  Target date: 3-5 days from date of admission   As evidenced by: Patient will participate within aftercare plan AEB aftercare provider and housing plan at discharge being identified.   02/26/16: Pt will return home and follow-up with outpatient services.   2.  Goal (s): Patient will exhibit decreased depressive symptoms and suicidal ideations.  Met:  Yes  Target date: 3-5 days from date of admission   As evidenced by: Patient will utilize self rating of depression at 3 or below and demonstrate decreased signs of depression or be deemed stable for discharge by MD. 02/26/16: Pt was admitted with symptoms of depression, rating 10/10. Pt continues to present with flat affect and depressive symptoms.  Pt will demonstrate decreased symptoms of  depression and rate depression at 3/10 or lower prior to discharge. 03/02/2016: Pt denies depression today as well as SI  5.  Goal(s): Patient will demonstrate decreased signs of psychosis  . Met:  Adequate for DC . Target date: 3-5 days from date of admission  . As evidenced by: Patient will demonstrate decreased frequency of AVH or return to baseline function - 02/29/16: Pt continues to endorse AH; he also has some paranoid thoughts with vague speech    03/02/2016: MD feels that Pt's symptoms have decreased to the point that    they can be managed in an outpatient setting.   Attendees:  Patient:    Family:    Physician: Dr. Julieanne Manson, MD  03/02/2016 11:44 AM  Nursing: Mayra Neer, RN; Gaylan Gerold, RN 03/02/2016 11:44 AM  Clinical Social Worker Peri Maris, [place] 03/02/2016 11:44 AM  Other: Tilden Fossa, [place] 03/02/2016 11:44 AM  Clinical: Lars Pinks, RN Case manager  03/02/2016 11:44 AM  Other:  03/02/2016 11:44 AM  Other:     Peri Maris, [place] Social Work [phone]

## 2016-05-11 ENCOUNTER — Other Ambulatory Visit: Payer: Self-pay | Admitting: Internal Medicine

## 2016-05-11 DIAGNOSIS — B20 Human immunodeficiency virus [HIV] disease: Secondary | ICD-10-CM

## 2016-06-02 ENCOUNTER — Telehealth: Payer: Self-pay | Admitting: *Deleted

## 2016-06-02 ENCOUNTER — Encounter: Payer: Self-pay | Admitting: *Deleted

## 2016-06-02 ENCOUNTER — Ambulatory Visit: Payer: Self-pay | Admitting: Internal Medicine

## 2016-06-02 NOTE — Telephone Encounter (Signed)
Voice mail now set up.  Unable to leave a message.

## 2016-06-16 ENCOUNTER — Telehealth: Payer: Self-pay | Admitting: *Deleted

## 2016-06-16 ENCOUNTER — Encounter: Payer: Self-pay | Admitting: Internal Medicine

## 2016-06-16 ENCOUNTER — Ambulatory Visit (INDEPENDENT_AMBULATORY_CARE_PROVIDER_SITE_OTHER): Payer: Medicaid Other | Admitting: Internal Medicine

## 2016-06-16 VITALS — BP 147/96 | HR 76 | Temp 98.3°F | Ht 66.0 in | Wt 151.8 lb

## 2016-06-16 DIAGNOSIS — F17210 Nicotine dependence, cigarettes, uncomplicated: Secondary | ICD-10-CM

## 2016-06-16 DIAGNOSIS — F640 Transsexualism: Secondary | ICD-10-CM | POA: Diagnosis not present

## 2016-06-16 DIAGNOSIS — Z789 Other specified health status: Secondary | ICD-10-CM

## 2016-06-16 DIAGNOSIS — F3170 Bipolar disorder, currently in remission, most recent episode unspecified: Secondary | ICD-10-CM | POA: Diagnosis not present

## 2016-06-16 DIAGNOSIS — Z23 Encounter for immunization: Secondary | ICD-10-CM

## 2016-06-16 DIAGNOSIS — B20 Human immunodeficiency virus [HIV] disease: Secondary | ICD-10-CM | POA: Diagnosis not present

## 2016-06-16 NOTE — Assessment & Plan Note (Signed)
His depression and psychoses are in remission. I [facility]

## 2016-06-16 NOTE — Assessment & Plan Note (Signed)
See if we can find her a new primary care provider who would be willing to help her with the medical therapy to transition.

## 2016-06-16 NOTE — Assessment & Plan Note (Signed)
Her infection is under very good control. I've given her a small pill bottle to keep with her all times so she does not have to miss when she is away from home. She will follow-up after lab work in 3 months.

## 2016-06-16 NOTE — Progress Notes (Signed)
Patient Active Problem List   Diagnosis Date Noted  . HIV disease (HCC) 06/20/2014    Priority: High  . Male-to-male transgender person 09/23/2015    Priority: Medium  . Major depressive disorder, recurrent episode, severe, with psychosis (HCC) 02/25/2016  . Cannabis use disorder, severe, dependence (HCC) 02/25/2016  . History of chlamydia 06/21/2014  . Bipolar disorder (HCC) 06/21/2014  . Anxiety 06/21/2014  . Cigarette smoker 06/21/2014  . History of syphilis 06/20/2014  . Dyslipidemia 06/20/2014    Patient's Medications  New Prescriptions   No medications on file  Previous Medications   FLUOXETINE (PROZAC) 20 MG CAPSULE    Take 1 capsule (20 mg total) by mouth daily. For depression   LISINOPRIL (PRINIVIL,ZESTRIL) 10 MG TABLET    Take 10 mg by mouth every evening.   OLANZAPINE (ZYPREXA) 15 MG TABLET    Take 1 tablet (15 mg total) by mouth at bedtime. For mood control   PREZCOBIX 800-150 MG TABLET    TAKE 1 TABLET BY MOUTH EVERY DAY WITH FOOD. SWALLOW WHOLE, DO NOT CRUSH, BREAK, OR CHEW   TRAZODONE (DESYREL) 50 MG TABLET    Take 1 tablet (50 mg total) by mouth at bedtime as needed for sleep.   TRIUMEQ 600-50-300 MG TABLET    TAKE 1 TABLET BY MOUTH DAILY  Modified Medications   No medications on file  Discontinued Medications   BACITRACIN OINTMENT    Apply 1 application topically 2 (two) times daily. For wound care    Subjective: Jeffery [name] is in for her routine HIV follow-up visit. She recalls missing only 1 or 2 doses of Triumeq and Prezcobix since her last visit. This occurred when she was away from home overnight and did not have her medication with her. She recently developed a fever blister on her lower lip. This occurs 1-2 times per year and last for 4-5 days. She treats with over-the-counter topical medications. She continues to smoke 2-3 cigarettes daily and has no current plan to quit. Her Medicaid was recently approved and she is looking for a new primary care  doctor. She is interested in continuing her medical transition from male to male. She continues to go to [facility] and sees a psychiatrist monthly. She sees a Veterinary surgeoncounselor every 2 weeks. She is not feeling depressed or anxious currently. She was started on lisinopril at [facility] several weeks ago because her blood pressure was elevated.  Review of Systems: Review of Systems  Constitutional: Negative for chills, diaphoresis, fever, malaise/fatigue and weight loss.  HENT: Negative for sore throat.   Respiratory: Negative for cough, sputum production and shortness of breath.   Cardiovascular: Negative for chest pain.  Gastrointestinal: Negative for abdominal pain, diarrhea, heartburn, nausea and vomiting.  Genitourinary: Negative for dysuria and frequency.  Musculoskeletal: Negative for joint pain and myalgias.  Skin: Negative for rash.  Neurological: Negative for dizziness and headaches.  Psychiatric/Behavioral: Negative for depression and substance abuse. The patient is not nervous/anxious.     Past Medical History:  Diagnosis Date  . Anxiety   . Chlamydia   . Depression   . HIV infection (HCC)   . Hyperlipidemia   . Hypertension    Pt went off meds on his own; has been monitored without any problems  . Syphilis 2013 and 2015    Social History  Substance Use Topics  . Smoking status: Light Tobacco Smoker    Packs/day: 0.10    Types: Cigarettes  .  Smokeless tobacco: Never Used     Comment: 3 cigarettes a day  . Alcohol use 0.0 oz/week     Comment: occasional    Family History  Problem Relation Age of Onset  . Hypertension Mother     No Known Allergies  Objective:  Vitals:   06/16/16 1059  BP: (!) 147/96  Pulse: 76  Temp: 98.3 F (36.8 C)  TempSrc: Oral  Weight: 151 lb 12 oz (68.8 kg)  Height: 5\' 6"  (1.676 m)   Body mass index is 24.49 kg/m.  Physical Exam  Constitutional: He is oriented to person, place, and time.  She is in good spirits.  HENT:    Mouth/Throat: No oropharyngeal exudate.  There is a superficial small ulcer on her lower lip.  Eyes: Conjunctivae are normal.  Cardiovascular: Normal rate and regular rhythm.   No murmur heard. Pulmonary/Chest: Breath sounds normal.  Abdominal: Soft. He exhibits no mass. There is no tenderness.  Musculoskeletal: Normal range of motion. He exhibits no edema or tenderness.  Neurological: He is alert and oriented to person, place, and time.  Skin: No rash noted.  No change in scattered moles.  Psychiatric: Mood and affect normal.    Lab Results Lab Results  Component Value Date   WBC 13.8 (H) 02/24/2016   HGB 15.7 02/24/2016   HCT 46.4 02/24/2016   MCV 94.9 02/24/2016   PLT 303 02/24/2016    Lab Results  Component Value Date   CREATININE 1.08 02/24/2016   BUN 12 02/24/2016   NA 135 02/24/2016   K 3.7 02/24/2016   CL 105 02/24/2016   CO2 23 02/24/2016    Lab Results  Component Value Date   ALT 17 02/24/2016   AST 22 02/24/2016   ALKPHOS 66 02/24/2016   BILITOT 0.8 02/24/2016    Lab Results  Component Value Date   CHOL 266 (H) 02/27/2016   HDL 46 02/27/2016   LDLCALC 200 (H) 02/27/2016   TRIG 98 02/27/2016   CHOLHDL 5.8 02/27/2016   HIV 1 RNA Quant (copies/mL)  Date Value  02/18/2016 <20  09/23/2015 <20  07/23/2015 5,861 (H)   CD4 T Cell Abs (/uL)  Date Value  02/18/2016 910  09/23/2015 1,040  07/23/2015 750     Problem List Items Addressed This Visit      High   HIV disease (HCC)    Her infection is under very good control. I've given her a small pill bottle to keep with her all times so she does not have to miss when she is away from home. She will follow-up after lab work in 3 months.      Relevant Orders   T-helper cell (CD4)- ([facility] clinic only)   HIV 1 RNA quant-no reflex-bld   CBC   Comprehensive metabolic panel   Lipid panel   RPR     Medium   Male-to-male transgender person    See if we can find her a new primary care provider who  would be willing to help her with the medical therapy to transition.        Unprioritized   Bipolar disorder (HCC)    His depression and psychoses are in remission. I encouraged him to stay in care at [facility].      Cigarette smoker    I encouraged him to consider complete cigarette cessation.       Other Visit Diagnoses    Need for Tdap vaccination    -  Primary  Relevant Orders   Tdap vaccine greater than or equal to 7yo IM (Completed)   Need for immunization against influenza       Relevant Orders   Flu Vaccine QUAD 36+ mos IM (Fluarix) (Completed)        Cliffton AstersJohn Rosangelica Pevehouse, MD [facility] [phone] pager   [phone] cell 06/16/2016, 11:41 AM

## 2016-06-16 NOTE — Assessment & Plan Note (Signed)
I encouraged him to consider complete cigarette cessation.

## 2016-06-16 NOTE — Telephone Encounter (Signed)
Requested Dr. Jennette KettleNeal call Dr. Cliffton AstersJohn Campbell re: transgender-male patient.  Patient is interested in beginning the process of transitioning.  Dr. Orvan Falconerampbell asked if Dr. Jennette KettleNeal would be able to assist him in identifying physicians to help the patient.  The patient does not currently have transportation outside of [place].

## 2016-06-24 NOTE — Telephone Encounter (Signed)
Spoke w dr Campbell--we Jeffery GreenlandWILL see patient. I will not be able to schedule her until aftyer t he holidays. Dr Orvan Falconerampbell aware.

## 2016-08-28 ENCOUNTER — Telehealth: Payer: Self-pay | Admitting: Family Medicine

## 2016-08-28 NOTE — Telephone Encounter (Signed)
[phone] (M) Spoke with Hope re entryi nto transgender clinic here at [facility]. Will schedule her march 12 at 11:00 Am with Dr Pollie MeyerMcIntyre and myself. 30 minute appt. I will send info in mail.

## 2016-08-29 NOTE — Telephone Encounter (Signed)
Pt scheduled appt March 12 and I notified patient Jeffery [name]

## 2016-09-09 ENCOUNTER — Encounter: Payer: Self-pay | Admitting: Internal Medicine

## 2016-09-14 ENCOUNTER — Encounter: Payer: Self-pay | Admitting: Family Medicine

## 2016-09-14 ENCOUNTER — Ambulatory Visit (INDEPENDENT_AMBULATORY_CARE_PROVIDER_SITE_OTHER): Payer: Medicaid Other | Admitting: Family Medicine

## 2016-09-14 DIAGNOSIS — F640 Transsexualism: Secondary | ICD-10-CM

## 2016-09-14 DIAGNOSIS — Z789 Other specified health status: Secondary | ICD-10-CM

## 2016-09-14 NOTE — Patient Instructions (Signed)
So great to meet you today!  Info on support group: [email or web address]  For next visit: - bring all your medications  - get fasting labwork done through Dr. Orvan Falconerampbell - work on quitting smoking - talk with Annia Friendlyhester about your plans for transitioning - read over the informed consent paper for hormone therapy  I'll see you in a few weeks. Call anytime with questions.  Be well, Dr. Pollie MeyerMcIntyre

## 2016-09-16 NOTE — Assessment & Plan Note (Signed)
Desiring hormonal therapy for male to male transition. Gender dysphoria seems clear - present since age 514-5 with impact on social functioning. Today we discussed a basic overview of medical therapy for transition, including feminizing hormones (estrogen) as well as androgen blockers (spironolactone). Gave broad overview of risks and monitoring with each of these, and gave written informed consent for her to read over prior to next visit. Current risk factors for hormonal therapy include hypertension, hyperlipidemia, tobacco abuse.  Psychiatric comorbidities are well controlled presently, though I would like for her to discuss plans for transition with her counselor as an additional source of support. Will need to ensure that any regimen we start is compatible with her HIV medications.  Plan: - she will bring all medications to next visit so we can do full medication reconciliation - obtain baseline labs: CMET, fasting lipids (these are already ordered through ID office) - she'll talk with therapist Annia Friendlyhester at [facility] about plans for transitioning - reviewed risks of estrogen, especially with tobacco use. She plans to quit smoking prior to next visit. - given informed consent paper for hormone therapy for her to read over, so we can discuss in detail at next visit - given info on transgender support groups in [place] area  Follow up with me in several weeks after fasting labs are drawn.

## 2016-09-16 NOTE — Progress Notes (Signed)
Date of Visit: 09/14/2016    HPI:  Jeffery [name] presents for a new patient visit to discuss initiating hormone therapy as part of transitioning from male to male.   Demographics: - Gender identity: male - Sex assigned at birth: male - Preferred name: wants to publicly be called Jeffery [name] for now (especially as Mom accompanies to visits in the waiting room) - Preferred pronouns: she/her  Transgender History: - Dysphoric symptoms: First realized gender identity did not match physical body at age 43 to 52. Has felt depressed at times due to gender dysphoria - feels it impacts her relationships with other people as she is always hiding something from them. About a year and a half ago wrote out a plan for transitioning, including hormonal treatment, hair removal, and eventually legal changes. - Dressing: last summer dressed as male, but then had to move back in with her mother in [place], who does not allow her to dress as male. Is working on moving into her own place so that she can dress how she wants.  - Therapy/Counseling: currently sees a Veterinary surgeon at [facility], does not discuss gender issues much with him. Prior to that saw a counselor in [place] named [place], with whom she was able to discuss gender issues in more detail. - Legal changes: has not made any legal changes but would eventually want to do this - Hormones: Two months ago took hormones that were purchased off the street. Had four types of pills, believes one was an estrogen and another may have been a testosterone blocker. Took for 1 month, noticed no changes or adverse effects, then stopped the medications. Has them at home still and could bring them to next visit. Prior to today has never seen a medical provider to discuss hormones. - Surgery: no prior surgeries, does not intend to have gender affirming surgery - Organ inventory: has all natal male organs (testes, prostate, penis) - Transition goals: Desires  to start medically supervised hormone therapy as soon as able. Her specific goals in using hormones are to soften features, grow breasts, and decrease facial hair. May at some point engage in hair removal. Does not intend to have surgery.  Medical History - innocent heart murmur - diagnosed as a teenager, reportedly "took ibuprofen" for this. Had what sounds like echo done, was told heart murmur was innocent - HIV - contracted as an 26 year old college freshman from unprotected sex. Followed by Dr. Orvan [name] at [facility]. Compliant with therapy, with undetectable viral loads. Sees ID every 3 months. - hypertension - unclear if she is currently taking lisinopril (on medication list, but she wasn't sure). She will bring to next visit.  - hyperlipidemia - not presently on any medications for this. - see psychiatric history below.  Social History - tobacco -smokes 3-4 cigarettes per day. Interested in quitting. Believes she will be able to do this without medication assistance or nicotine replacement - drugs: uses marijuana a few times per week - alcohol: drinks about twice per month, has 3-4 drinks at a time - sexual history: 15 lifetime partners. Two cisgendered male partners in the last year. 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 Now uses condoms every time. Engages in giving & receiving oral sex, and receptive anal intercourse. Also has history of treated syphilis & chlamydia in the past. - work/school history: left college with 3 semesters left, may eventually go back one day to finish. Is currently on disability for her psychiatric illnesses. -  currently lives with: mother, who is not supportive of her being transgender. Plans to move out into her own living space. - support system: has some friends with whom she can talk about gender issues. Feels fairly isolated. Interested in support group information.  Psychiatric History - Diagnoses: reports history of depression,  anxiety, PTSD, bipolar II - Psychiatrist: Sees [facility] Theola [name] at [facility] every few months. - Therapist: since [name] has seen counselor named Jeffery [name] at [facility] on a monthly basis - Medications: Currently takes prozac 20mg  daily and Zyprexa 15mg  at night for mood control - She feels this regimen is working well and mood is well controlled. No SI/HI.   Family History: - MGM with pacemaker, stroke in 7960's - no family history of hypertension, diabetes, cancer, other medical issues  PHYSICAL EXAM: BP 140/90 (BP Location: Left Arm, Patient Position: Sitting, Cuff Size: Normal)   Pulse 74   Temp 98.3 F (36.8 C) (Oral)   Ht 5\' 6"  (1.676 m)   Wt 164 lb 9.6 oz (74.7 kg)   SpO2 98%   BMI 26.57 kg/m  Gen: no acute distress, pleasant, cooperative HEENT: normocephalic, atraumatic, moist mucous membranes. No anterior cervical or supraclavicular lymphadenopathy. Thyroid without nodules or enlargement. Heart: regular rate and rhythm, no murmur Lungs: clear to auscultation bilaterally, normal work of breathing  Neuro: alert, grossly nonfocal, speech normal Skin: facial hair (beard) present Extremities: No appreciable lower extremity edema bilaterally  Psych: normal range of affect, well groomed, speech normal in rate and volume, normal eye contact   ASSESSMENT/PLAN:  Male-to-male transgender person Desiring hormonal therapy for male to male transition. Gender dysphoria seems clear - present since age 734-5 with impact on social functioning. Today we discussed a basic overview of medical therapy for transition, including feminizing hormones (estrogen) as well as androgen blockers (spironolactone). Gave broad overview of risks and monitoring with each of these, and gave written informed consent for her to read over prior to next visit. Current risk factors for hormonal therapy include hypertension, hyperlipidemia, tobacco abuse.  Psychiatric comorbidities are well controlled presently, though I would  like for her to discuss plans for transition with her counselor as an additional source of support. Will need to ensure that any regimen we start is compatible with her HIV medications.  Plan: - she will bring all medications to next visit so we can do full medication reconciliation - obtain baseline labs: CMET, fasting lipids (these are already ordered through ID office) - she'll talk with therapist Jeffery [name] at [facility] about plans for transitioning - reviewed risks of estrogen, especially with tobacco use. She plans to quit smoking prior to next visit. - given informed consent paper for hormone therapy for her to read over, so we can discuss in detail at next visit - given info on transgender support groups in [place] area  Follow up with me in several weeks after fasting labs are drawn.    FOLLOW UP: Follow up in several weeks to continue discussing medical therapy for transition.   GrenadaBrittany J. Pollie MeyerMcIntyre, MD [facility]   Greater than 30 minutes were spent during this encounter, with at least 50% of the time devoted to counseling and coordination of care.

## 2016-09-21 ENCOUNTER — Other Ambulatory Visit: Payer: Medicaid Other

## 2016-09-21 DIAGNOSIS — B20 Human immunodeficiency virus [HIV] disease: Secondary | ICD-10-CM

## 2016-09-21 LAB — LIPID PANEL
Cholesterol: 316 mg/dL — ABNORMAL HIGH (ref <200)
HDL: 33 mg/dL — AB (ref >40)
LDL CALC: 235 mg/dL — AB (ref <100)
TRIGLYCERIDES: 238 mg/dL — AB (ref <150)
Total CHOL/HDL Ratio: 9.6 Ratio — ABNORMAL HIGH (ref <5.0)
VLDL: 48 mg/dL — AB (ref <30)

## 2016-09-21 LAB — COMPREHENSIVE METABOLIC PANEL
ALBUMIN: 4.4 g/dL (ref 3.6–5.1)
ALT: 12 U/L (ref 9–46)
AST: 17 U/L (ref 10–40)
Alkaline Phosphatase: 76 U/L (ref 40–115)
BILIRUBIN TOTAL: 0.8 mg/dL (ref 0.2–1.2)
BUN: 12 mg/dL (ref 7–25)
CHLORIDE: 103 mmol/L (ref 98–110)
CO2: 23 mmol/L (ref 20–31)
CREATININE: 1.17 mg/dL (ref 0.60–1.35)
Calcium: 9.7 mg/dL (ref 8.6–10.3)
GLUCOSE: 81 mg/dL (ref 65–99)
Potassium: 4 mmol/L (ref 3.5–5.3)
SODIUM: 137 mmol/L (ref 135–146)
Total Protein: 7.5 g/dL (ref 6.1–8.1)

## 2016-09-21 LAB — CBC
HCT: 45.7 % (ref 38.5–50.0)
HEMOGLOBIN: 15.2 g/dL (ref 13.2–17.1)
MCH: 32.3 pg (ref 27.0–33.0)
MCHC: 33.3 g/dL (ref 32.0–36.0)
MCV: 97 fL (ref 80.0–100.0)
MPV: 10.3 fL (ref 7.5–12.5)
Platelets: 301 10*3/uL (ref 140–400)
RBC: 4.71 MIL/uL (ref 4.20–5.80)
RDW: 15.2 % — AB (ref 11.0–15.0)
WBC: 12.2 10*3/uL — ABNORMAL HIGH (ref 3.8–10.8)

## 2016-09-22 LAB — RPR TITER: RPR Titer: 1:4 {titer}

## 2016-09-22 LAB — RPR: RPR: REACTIVE — AB

## 2016-09-22 LAB — T-HELPER CELL (CD4) - (RCID CLINIC ONLY)
CD4 T CELL ABS: 1230 /uL (ref 400–2700)
CD4 T CELL HELPER: 29 % — AB (ref 33–55)

## 2016-09-23 LAB — HIV-1 RNA QUANT-NO REFLEX-BLD
HIV 1 RNA QUANT: NOT DETECTED {copies}/mL
HIV-1 RNA QUANT, LOG: NOT DETECTED {Log_copies}/mL

## 2016-09-23 LAB — FLUORESCENT TREPONEMAL AB(FTA)-IGG-BLD: Fluorescent Treponemal ABS: REACTIVE — AB

## 2016-10-05 ENCOUNTER — Ambulatory Visit (INDEPENDENT_AMBULATORY_CARE_PROVIDER_SITE_OTHER): Payer: Medicaid Other | Admitting: Internal Medicine

## 2016-10-05 ENCOUNTER — Encounter: Payer: Self-pay | Admitting: Internal Medicine

## 2016-10-05 VITALS — BP 147/101 | HR 80 | Temp 98.2°F | Ht 66.5 in | Wt 157.0 lb

## 2016-10-05 DIAGNOSIS — F17210 Nicotine dependence, cigarettes, uncomplicated: Secondary | ICD-10-CM

## 2016-10-05 DIAGNOSIS — Z789 Other specified health status: Secondary | ICD-10-CM

## 2016-10-05 DIAGNOSIS — B20 Human immunodeficiency virus [HIV] disease: Secondary | ICD-10-CM

## 2016-10-05 DIAGNOSIS — Z23 Encounter for immunization: Secondary | ICD-10-CM | POA: Diagnosis not present

## 2016-10-05 DIAGNOSIS — F640 Transsexualism: Secondary | ICD-10-CM

## 2016-10-05 DIAGNOSIS — I10 Essential (primary) hypertension: Secondary | ICD-10-CM | POA: Insufficient documentation

## 2016-10-05 MED ORDER — LISINOPRIL 10 MG PO TABS: 10.0000 mg | ORAL_TABLET | Freq: Every evening | ORAL | 11 refills | Status: DC

## 2016-10-05 NOTE — Addendum Note (Signed)
Addended by: Wendall Mola A on: 10/05/2016 03:07 PM   Modules accepted: Orders

## 2016-10-05 NOTE — Assessment & Plan Note (Signed)
He continues to try to cut down on his cigarettes. He has not set a quit date. However he says that there have been more days recently when he did not smoke at all.I encouraged him to think about setting a it date.

## 2016-10-05 NOTE — Assessment & Plan Note (Signed)
I have reviewed potential drug drug interactions with our infectious disease pharmacist. Prezcobix can increase metabolism of estrogen preparations so his estrogen a need to be titrated to a higher level to get desired effects. Estrogen and spironolactone should not impact the effectiveness of his antiretroviral regimen.

## 2016-10-05 NOTE — Assessment & Plan Note (Signed)
He ran out of his lisinopril sometime last year and his blood pressure is up. I will restart lisinopril now.

## 2016-10-05 NOTE — Assessment & Plan Note (Signed)
His infection is under excellent, long-term control. He will continue his current antiretroviral regimen. Have given him a new pill canister for his key chain. He will follow-up after lab work in 6 months.

## 2016-10-05 NOTE — Progress Notes (Signed)
Patient Active Problem List   Diagnosis Date Noted  . HIV disease (HCC) 06/20/2014    Priority: High  . Male-to-male transgender person 09/23/2015    Priority: Medium  . Hypertension 10/05/2016  . Major depressive disorder, recurrent episode, severe, with psychosis (HCC) 02/25/2016  . Cannabis use disorder, severe, dependence (HCC) 02/25/2016  . History of chlamydia 06/21/2014  . Bipolar disorder (HCC) 06/21/2014  . Anxiety 06/21/2014  . Cigarette smoker 06/21/2014  . History of syphilis 06/20/2014  . Dyslipidemia 06/20/2014    Patient's Medications  New Prescriptions   No medications on file  Previous Medications   DOXEPIN (SINEQUAN) 10 MG CAPSULE    Take 10 mg by mouth at bedtime.   FLUOXETINE (PROZAC) 20 MG CAPSULE    Take 1 capsule (20 mg total) by mouth daily. For depression   OLANZAPINE (ZYPREXA) 15 MG TABLET    Take 1 tablet (15 mg total) by mouth at bedtime. For mood control   PREZCOBIX 800-150 MG TABLET    TAKE 1 TABLET BY MOUTH EVERY DAY WITH FOOD. SWALLOW WHOLE, DO NOT CRUSH, BREAK, OR CHEW   TRIUMEQ 600-50-300 MG TABLET    TAKE 1 TABLET BY MOUTH DAILY  Modified Medications   Modified Medication Previous Medication   LISINOPRIL (PRINIVIL,ZESTRIL) 10 MG TABLET lisinopril (PRINIVIL,ZESTRIL) 10 MG tablet      Take 1 tablet (10 mg total) by mouth every evening.    Take 10 mg by mouth every evening.  Discontinued Medications   No medications on file    Subjective: Jeffery [name] (he prefers not to be called Hope today) is in for his routine HIV follow-up visit. He recalls missing 1-2 doses of his Triumeq and Prezcobix since his last visit. This occurred when he was away from home overnight and did not take his medication with him. He lost the pill canister we gave him to keep on his key chain. He is very excited that he had his first visit with Dr. Ardine Bjork to discuss gender transition from male to male. He has gotten the blood work that he needed and  will be making a follow-up visit soon to discuss starting estrogen and spironolactone. He has not been back to see his counselor at [facility] yet. He states that his mood has been better recently. He does not currently feel depressed.  Review of Systems: Review of Systems  Constitutional: Negative for chills, diaphoresis, fever, malaise/fatigue and weight loss.  HENT: Negative for sore throat.   Respiratory: Negative for cough, sputum production and shortness of breath.   Cardiovascular: Negative for chest pain.  Gastrointestinal: Negative for abdominal pain, diarrhea, heartburn, nausea and vomiting.  Genitourinary: Negative for dysuria and frequency.  Musculoskeletal: Negative for joint pain and myalgias.  Skin: Negative for rash.  Neurological: Negative for dizziness and headaches.  Psychiatric/Behavioral: Negative for depression and substance abuse. The patient is not nervous/anxious.     Past Medical History:  Diagnosis Date  . Anxiety   . Chlamydia   . Depression   . HIV infection (HCC)   . Hyperlipidemia   . Hypertension    Pt went off meds on his own; has been monitored without any problems  . Syphilis 2013 and 2015    Social History  Substance Use Topics  . Smoking status: Light Tobacco Smoker    Packs/day: 0.25    Types: Cigarettes  . Smokeless tobacco: Never Used     Comment: 5 cigarettes a day  .  Alcohol use 0.0 oz/week     Comment: occasional    Family History  Problem Relation Age of Onset  . Hypertension Mother     Allergies  Allergen Reactions  . Bee Venom Swelling    Large local reactions    Objective:  Vitals:   10/05/16 1441 10/05/16 1503  BP: (!) 161/99 (!) 147/101  Pulse: 80   Temp: 98.2 F (36.8 C)   TempSrc: Oral   Weight: 157 lb (71.2 kg)   Height: 5' 6.5" (1.689 m)    Body mass index is 24.96 kg/m.  Physical Exam  Constitutional: He is oriented to person, place, and time.  He is in very good spirits today.  HENT:    Mouth/Throat: No oropharyngeal exudate.  Eyes: Conjunctivae are normal.  Cardiovascular: Normal rate and regular rhythm.   No murmur heard. Pulmonary/Chest: Effort normal and breath sounds normal.  Abdominal: Soft. He exhibits no mass. There is no tenderness.  Musculoskeletal: Normal range of motion.  Neurological: He is alert and oriented to person, place, and time.  Skin: No rash noted.  Psychiatric: Mood and affect normal.    Lab Results Lab Results  Component Value Date   WBC 12.2 (H) 09/21/2016   HGB 15.2 09/21/2016   HCT 45.7 09/21/2016   MCV 97.0 09/21/2016   PLT 301 09/21/2016    Lab Results  Component Value Date   CREATININE 1.17 09/21/2016   BUN 12 09/21/2016   NA 137 09/21/2016   K 4.0 09/21/2016   CL 103 09/21/2016   CO2 23 09/21/2016    Lab Results  Component Value Date   ALT 12 09/21/2016   AST 17 09/21/2016   ALKPHOS 76 09/21/2016   BILITOT 0.8 09/21/2016    Lab Results  Component Value Date   CHOL 316 (H) 09/21/2016   HDL 33 (L) 09/21/2016   LDLCALC 235 (H) 09/21/2016   TRIG 238 (H) 09/21/2016   CHOLHDL 9.6 (H) 09/21/2016   HIV 1 RNA Quant (copies/mL)  Date Value  09/21/2016 <20 NOT DETECTED  02/18/2016 <20  09/23/2015 <20   CD4 T Cell Abs (/uL)  Date Value  09/21/2016 1,230  02/18/2016 910  09/23/2015 1,040     Problem List Items Addressed This Visit      High   HIV disease (HCC)    His infection is under excellent, long-term control. He will continue his current antiretroviral regimen. Have given him a new pill canister for his key chain. He will follow-up after lab work in 6 months.      Relevant Orders   T-helper cell (CD4)- ([facility] clinic only)   HIV 1 RNA quant-no reflex-bld     Medium   Male-to-male transgender person    I have reviewed potential drug drug interactions with our infectious disease pharmacist. Prezcobix can increase metabolism of estrogen preparations so his estrogen a need to be titrated to a higher level  to get desired effects. Estrogen and spironolactone should not impact the effectiveness of his antiretroviral regimen.        Unprioritized   Cigarette smoker    He continues to try to cut down on his cigarettes. He has not set a quit date. However he says that there have been more days recently when he did not smoke at all.I encouraged him to think about setting a it date.      Hypertension    He ran out of his lisinopril sometime last year and his blood pressure is up.  I will restart lisinopril now.      Relevant Medications   lisinopril (PRINIVIL,ZESTRIL) 10 MG tablet        Cliffton Asters, MD [facility] [phone] pager   [phone] cell 10/05/2016, 3:05 PM

## 2016-11-04 ENCOUNTER — Other Ambulatory Visit: Payer: Self-pay | Admitting: Internal Medicine

## 2016-11-04 DIAGNOSIS — B20 Human immunodeficiency virus [HIV] disease: Secondary | ICD-10-CM

## 2016-11-24 ENCOUNTER — Ambulatory Visit: Payer: Medicaid Other | Admitting: Family Medicine

## 2017-02-23 ENCOUNTER — Encounter (HOSPITAL_COMMUNITY): Payer: Self-pay | Admitting: Emergency Medicine

## 2017-02-23 ENCOUNTER — Emergency Department (HOSPITAL_COMMUNITY)
Admission: EM | Admit: 2017-02-23 | Discharge: 2017-02-23 | Disposition: A | Discharge status: Home / Self Care | Payer: Medicaid Other | Attending: Emergency Medicine | Admitting: Emergency Medicine

## 2017-02-23 ENCOUNTER — Other Ambulatory Visit: Payer: Self-pay | Admitting: Pharmacist Clinician (PhC)/ Clinical Pharmacy Specialist

## 2017-02-23 DIAGNOSIS — Z79899 Other long term (current) drug therapy: Secondary | ICD-10-CM | POA: Insufficient documentation

## 2017-02-23 DIAGNOSIS — F17210 Nicotine dependence, cigarettes, uncomplicated: Secondary | ICD-10-CM | POA: Diagnosis not present

## 2017-02-23 DIAGNOSIS — T464X5A Adverse effect of angiotensin-converting-enzyme inhibitors, initial encounter: Secondary | ICD-10-CM

## 2017-02-23 DIAGNOSIS — I10 Essential (primary) hypertension: Secondary | ICD-10-CM | POA: Diagnosis not present

## 2017-02-23 DIAGNOSIS — R220 Localized swelling, mass and lump, head: Secondary | ICD-10-CM | POA: Diagnosis present

## 2017-02-23 DIAGNOSIS — T783XXA Angioneurotic edema, initial encounter: Secondary | ICD-10-CM | POA: Diagnosis not present

## 2017-02-23 MED ORDER — METHYLPREDNISOLONE SODIUM SUCC 125 MG IJ SOLR
125.0000 mg | Freq: Once | INTRAMUSCULAR | Status: AC
  Administered 2017-02-23: 125 mg via INTRAVENOUS
  Filled 2017-02-23: qty 2

## 2017-02-23 MED ORDER — PREDNISONE 10 MG PO TABS
20.0000 mg | ORAL_TABLET | Freq: Two times a day (BID) | ORAL | 0 refills | Status: DC
Start: 2017-02-23 — End: 2017-04-28

## 2017-02-23 MED ORDER — DIPHENHYDRAMINE HCL 50 MG/ML IJ SOLN
25.0000 mg | Freq: Once | INTRAMUSCULAR | Status: AC
  Administered 2017-02-23: 25 mg via INTRAVENOUS
  Filled 2017-02-23: qty 1

## 2017-02-23 NOTE — Discharge Instructions (Signed)
Stop taking lisinopril.  Begin taking prednisone 20 mg twice daily for the next 3 days, and Benadryl 25 mg 4 times daily for the next 3 days.  Keep a record of your blood pressure and take this with you to your next doctor's appointment. Return to the emergency department in the meantime if your symptoms worsen or change.

## 2017-02-23 NOTE — ED Triage Notes (Signed)
Pt here with angioedema; large amount of facial swelling noted into left side of face and neck

## 2017-02-23 NOTE — ED Provider Notes (Signed)
[facility]-EMERGENCY DEPT Provider Note   CSN: [id number] Arrival date & time: 02/23/17  1139     History   Chief Complaint Chief Complaint  Patient presents with  . Angioedema    HPI Jeffery [name] is a 26 y.o. male.  Patient is a 85 rolled male with past medical history of HIV disease and hypertension treated with lisinopril. He presents today for evaluation of swelling of his upper lip. This began yesterday evening while he was eating noodles. Worsened through the night presents for evaluation of it. He denies any difficulty breathing or swallowing. He denies any injury or trauma. He has been treated with lisinopril for approximately the past 2 months.   The history is provided by the patient.    Past Medical History:  Diagnosis Date  . Anxiety   . Chlamydia   . Depression   . HIV infection (HCC)   . Hyperlipidemia   . Hypertension    Pt went off meds on his own; has been monitored without any problems  . Syphilis 2013 and 2015    Patient Active Problem List   Diagnosis Date Noted  . Hypertension 10/05/2016  . Major depressive disorder, recurrent episode, severe, with psychosis (HCC) 02/25/2016  . Cannabis use disorder, severe, dependence (HCC) 02/25/2016  . Male-to-male transgender person 09/23/2015  . History of chlamydia 06/21/2014  . Bipolar disorder (HCC) 06/21/2014  . Anxiety 06/21/2014  . Cigarette smoker 06/21/2014  . HIV disease (HCC) 06/20/2014  . History of syphilis 06/20/2014  . Dyslipidemia 06/20/2014    History reviewed. No pertinent surgical history.     Home Medications    Prior to Admission medications   Medication Sig Start Date End Date Taking? Authorizing Provider  doxepin (SINEQUAN) 10 MG capsule Take 10 mg by mouth at bedtime.    [provider]  FLUoxetine (PROZAC) 20 MG capsule Take 1 capsule (20 mg total) by mouth daily. For depression 03/02/16   Armandina Stammer I, NP  lisinopril (PRINIVIL,ZESTRIL) 10 MG tablet Take 1 tablet  (10 mg total) by mouth every evening. 10/05/16   Cliffton Asters, MD  OLANZapine (ZYPREXA) 15 MG tablet Take 1 tablet (15 mg total) by mouth at bedtime. For mood control 03/02/16   Nwoko, Nicole Kindred I, NP  PREZCOBIX 800-150 MG tablet TAKE 1 TABLET BY MOUTH EVERY DAY WITH FOOD. SWALLOW WHOLE; DO NOT CRUSH, BREAK, OR CHEW 11/04/16   Cliffton Asters, MD  TRIUMEQ 600-50-300 MG tablet TAKE 1 TABLET BY MOUTH DAILY 11/04/16   Cliffton Asters, MD    Family History Family History  Problem Relation Age of Onset  . Hypertension Mother     Social History Social History  Substance Use Topics  . Smoking status: Light Tobacco Smoker    Packs/day: 0.25    Types: Cigarettes  . Smokeless tobacco: Never Used     Comment: 5 cigarettes a day  . Alcohol use 0.0 oz/week     Comment: occasional     Allergies   Bee venom   Review of Systems Review of Systems  All other systems reviewed and are negative.    Physical Exam Updated Vital Signs BP (!) 143/89 (BP Location: Left Arm)   Pulse 66   Temp 98.7 F (37.1 C) (Oral)   Resp 16   Ht 5\' 7"  (1.702 m)   Wt 72.6 kg (160 lb)   SpO2 100%   BMI 25.06 kg/m   Physical Exam  Constitutional: He is oriented to person, place, and time. He appears  well-developed and well-nourished. No distress.  HENT:  Head: Normocephalic and atraumatic.  Mouth/Throat: Oropharynx is clear and moist.  There is significant swelling of the upper lip, and to a lesser extent the lower lip. There is no tongue swelling. There is no stridor or obvious intraoral involvement.  Neck: Normal range of motion. Neck supple.  Cardiovascular: Normal rate and regular rhythm.  Exam reveals no friction rub.   No murmur heard. Pulmonary/Chest: Effort normal and breath sounds normal. No respiratory distress. He has no wheezes. He has no rales.  Abdominal: Soft. Bowel sounds are normal. He exhibits no distension. There is no tenderness.  Musculoskeletal: Normal range of motion. He exhibits no edema.    Neurological: He is alert and oriented to person, place, and time. Coordination normal.  Skin: Skin is warm and dry. He is not diaphoretic.  Nursing note and vitals reviewed.    ED Treatments / Results  Labs (all labs ordered are listed, but only abnormal results are displayed) Labs Reviewed - No data to display  EKG  EKG Interpretation None       Radiology No results found.  Procedures Procedures (including critical care time)  Medications Ordered in ED Medications  methylPREDNISolone sodium succinate (SOLU-MEDROL) 125 mg/2 mL injection 125 mg (not administered)  diphenhydrAMINE (BENADRYL) injection 25 mg (not administered)     Initial Impression / Assessment and Plan / ED Course  I have reviewed the triage vital signs and the nursing notes.  Pertinent labs & imaging results that were available during my care of the patient were reviewed by me and considered in my medical decision making (see chart for details).  Patient presents with swelling of the upper lip and to a lesser degree the lower lip. He has been treated with lisinopril for the past 2 months for hypertension. This appears to be angioedema related to this medication. He has been observed for several hours in the ED, and his symptoms have not worsened. He does look somewhat improved. There is no stridor or airway involvement. At this point, I feel as though he is appropriate for discharge. I will have him stop his lisinopril and keep a record of his blood pressures. I will also prescribe prednisone along with Benadryl.  Final Clinical Impressions(s) / ED Diagnoses   Final diagnoses:  None    New Prescriptions New Prescriptions   No medications on file     Geoffery Lyons, MD 02/23/17 1514

## 2017-02-23 NOTE — ED Notes (Signed)
Patient verbalized understanding of discharge instructions and denies any further needs or questions at this time. VS stable. Patient ambulatory with steady gait.  

## 2017-02-25 ENCOUNTER — Encounter: Payer: Self-pay | Admitting: Infectious Diseases

## 2017-02-25 ENCOUNTER — Ambulatory Visit (INDEPENDENT_AMBULATORY_CARE_PROVIDER_SITE_OTHER): Payer: Medicaid Other | Admitting: Infectious Diseases

## 2017-02-25 VITALS — BP 134/84 | HR 70 | Temp 98.0°F | Ht 66.5 in | Wt 164.0 lb

## 2017-02-25 DIAGNOSIS — I10 Essential (primary) hypertension: Secondary | ICD-10-CM | POA: Diagnosis not present

## 2017-02-25 MED ORDER — AMLODIPINE BESYLATE 5 MG PO TABS: 5.0000 mg | ORAL_TABLET | Freq: Every day | ORAL | 11 refills | Status: DC

## 2017-02-25 NOTE — Assessment & Plan Note (Addendum)
Stopped Lisinopril and was added to allergy list. Will start him on amlodipine 5 mg QD. Counseled on side effects and requested to notify through MyChart should he have any issues prior to his next visit with Dr. Orvan Falconer. Reviewed interactions and potential for augmented effect with cobi. I encouraged him to monitor his BP at home or at drug store and notify if < 100 for top number or he gets dizziness.   Ultimately is going to start on spironolactone + estrogen. May be a neutral effect since spiro can lower BP and estrogen can raise it, however I informed him that it would be a good idea to pay close attention once these medications are added.

## 2017-02-25 NOTE — Progress Notes (Signed)
Brief Narrative: Jeffery [name] is a HIV+ patient of Dr. Blair Dolphin here today to discuss changing blood pressure medication.  PCP - Latrelle Dodrill, MD    Patient Active Problem List   Diagnosis Date Noted  . Hypertension 10/05/2016  . Major depressive disorder, recurrent episode, severe, with psychosis (HCC) 02/25/2016  . Cannabis use disorder, severe, dependence (HCC) 02/25/2016  . Male-to-male transgender person 09/23/2015  . History of chlamydia 06/21/2014  . Bipolar disorder (HCC) 06/21/2014  . Anxiety 06/21/2014  . Cigarette smoker 06/21/2014  . HIV disease (HCC) 06/20/2014  . History of syphilis 06/20/2014  . Dyslipidemia 06/20/2014    Patient's Medications  New Prescriptions   AMLODIPINE (NORVASC) 5 MG TABLET    Take 1 tablet (5 mg total) by mouth daily.  Previous Medications   DOXEPIN (SINEQUAN) 10 MG CAPSULE    Take 10 mg by mouth at bedtime.   FLUOXETINE (PROZAC) 20 MG CAPSULE    Take 1 capsule (20 mg total) by mouth daily. For depression   OLANZAPINE (ZYPREXA) 15 MG TABLET    Take 1 tablet (15 mg total) by mouth at bedtime. For mood control   PRAZOSIN (MINIPRESS) 2 MG CAPSULE    Take 2 mg by mouth at bedtime.   PREDNISONE (DELTASONE) 10 MG TABLET    Take 2 tablets (20 mg total) by mouth 2 (two) times daily with a meal.   PREZCOBIX 800-150 MG TABLET    TAKE 1 TABLET BY MOUTH EVERY DAY WITH FOOD. SWALLOW WHOLE; DO NOT CRUSH, BREAK, OR CHEW   TRIUMEQ 600-50-300 MG TABLET    TAKE 1 TABLET BY MOUTH DAILY  Modified Medications   No medications on file  Discontinued Medications   No medications on file    Subjective: Jeffery [name] presents to clinic today for routine follow up care for his HIV infection.  HPI:  2 days ago Jeffery [name] was in the ER [facility] for angioedema secondary to his lisinopril. No airway compromise or tongue swelling at that time only his lips. Improvement in swelling today and now only slightly present. Otherwise tolerated his BP  medication well. Was on one other medication in the past however he cannot recall which this was. No secondary complications such as vision changes or headaches. Does not check BP at home.   Excited to discuss gender transformation therapy. Missed an appointment and will reschedule soon. Requested to be called Jeffery [name] today but will eventually go by Jeffery [name].    Review of Systems  Constitutional: Negative for chills and fever.  HENT:       Swelling of the lips/mouth 2 days ago - improved  Respiratory: Negative for cough and shortness of breath.   Cardiovascular: Negative for chest pain, orthopnea and leg swelling.  Skin: Negative for itching.  Neurological: Negative for headaches.    Past Medical History:  Diagnosis Date  . Anxiety   . Chlamydia   . Depression   . HIV infection (HCC)   . Hyperlipidemia   . Hypertension    Pt went off meds on his own; has been monitored without any problems  . Syphilis 2013 and 2015    Objective:  Vitals:   02/25/17 1046  BP: 134/84  Pulse: 70  Temp: 98 F (36.7 C)  TempSrc: Oral  Weight: 164 lb (74.4 kg)  Height: 5' 6.5" (1.689 m)   Body mass index is 26.07 kg/m.  Physical Exam  Constitutional: He is oriented to person, place, and time  and well-developed, well-nourished, and in no distress.  HENT:  Only minimal lip swelling noted today. Showed me a picture of 2 days prior and significant improvement.   Eyes: No scleral icterus.  Cardiovascular: Normal rate, regular rhythm, normal heart sounds and intact distal pulses.   Pulmonary/Chest: Effort normal and breath sounds normal.  Neurological: He is alert and oriented to person, place, and time.  Skin: Skin is warm and dry.  Psychiatric: Mood and affect normal.    LABS & DIAGNOSTICS: Lab Results  Component Value Date   CD4TCELL 29 (L) 09/21/2016   CD4TABS 1,230 09/21/2016   Lab Results  Component Value Date   HIV1RNAQUANT <20 NOT DETECTED 09/21/2016     Problem List Items  Addressed This Visit      Cardiovascular and Mediastinum   Hypertension - Primary    Stopped Lisinopril and was added to allergy list. Will start him on amlodipine 5 mg QD. Counseled on side effects and requested to notify through MyChart should he have any issues prior to his next visit with Dr. Orvan Falconer. Reviewed interactions and potential for augmented effect with cobi. I encouraged him to monitor his BP at home or at drug store and notify if < 100 for top number or he gets dizziness.   Ultimately is going to start on spironolactone + estrogen. May be a neutral effect since spiro can lower BP and estrogen can raise it, however I informed him that it would be a good idea to pay close attention once these medications are added.       Relevant Medications   amLODipine (NORVASC) 5 MG tablet       Rexene Alberts, MSN, NP-[name]   02/25/17 12:41 PM

## 2017-02-25 NOTE — Patient Instructions (Addendum)
We will start you on a new blood pressure medication today called amlodipine (Norvasc). You will take one pill once a day. Safe to take with your Triumeq and Prezcobix.   Please notify us through my chart if you have any problems with the medication. Common side effects include leg swelling.   You can follow up with Dr. Orvan Falconer in 2 months - make sure you have an appointment scheduled before you leave.

## 2017-04-27 ENCOUNTER — Ambulatory Visit: Payer: Self-pay | Admitting: Internal Medicine

## 2017-04-28 ENCOUNTER — Ambulatory Visit (INDEPENDENT_AMBULATORY_CARE_PROVIDER_SITE_OTHER): Payer: Medicaid Other | Admitting: Internal Medicine

## 2017-04-28 ENCOUNTER — Encounter: Payer: Self-pay | Admitting: Internal Medicine

## 2017-04-28 DIAGNOSIS — B20 Human immunodeficiency virus [HIV] disease: Secondary | ICD-10-CM | POA: Diagnosis not present

## 2017-04-28 DIAGNOSIS — F640 Transsexualism: Secondary | ICD-10-CM | POA: Diagnosis not present

## 2017-04-28 DIAGNOSIS — F3170 Bipolar disorder, currently in remission, most recent episode unspecified: Secondary | ICD-10-CM | POA: Diagnosis not present

## 2017-04-28 DIAGNOSIS — F17210 Nicotine dependence, cigarettes, uncomplicated: Secondary | ICD-10-CM

## 2017-04-28 DIAGNOSIS — Z789 Other specified health status: Secondary | ICD-10-CM

## 2017-04-28 DIAGNOSIS — Z23 Encounter for immunization: Secondary | ICD-10-CM | POA: Diagnosis present

## 2017-04-28 NOTE — Assessment & Plan Note (Signed)
I talked him about the importance of cigarette cessation.

## 2017-04-28 NOTE — Assessment & Plan Note (Signed)
He is a little bit worried about worsening depression in the coming months as this has been a pattern for him in the past. I [facility] but also told him about the counselors that he can see here.

## 2017-04-28 NOTE — Progress Notes (Signed)
[facility]  Patient Active Problem List   Diagnosis Date Noted  . HIV disease (HCC) 06/20/2014    Priority: High  . Male-to-male transgender person 09/23/2015    Priority: Medium  . Hypertension 10/05/2016  . Major depressive disorder, recurrent episode, severe, with psychosis (HCC) 02/25/2016  . Cannabis use disorder, severe, dependence (HCC) 02/25/2016  . History of chlamydia 06/21/2014  . Bipolar disorder (HCC) 06/21/2014  . Anxiety 06/21/2014  . Cigarette smoker 06/21/2014  . History of syphilis 06/20/2014  . Dyslipidemia 06/20/2014    Patient's Medications  New Prescriptions   No medications on file  Previous Medications   AMLODIPINE (NORVASC) 5 MG TABLET    Take 1 tablet (5 mg total) by mouth daily.   DOXEPIN (SINEQUAN) 10 MG CAPSULE    Take 10 mg by mouth at bedtime.   FLUOXETINE (PROZAC) 20 MG CAPSULE    Take 1 capsule (20 mg total) by mouth daily. For depression   OLANZAPINE (ZYPREXA) 15 MG TABLET    Take 1 tablet (15 mg total) by mouth at bedtime. For mood control   PRAZOSIN (MINIPRESS) 2 MG CAPSULE    Take 2 mg by mouth at bedtime.   PREZCOBIX 800-150 MG TABLET    TAKE 1 TABLET BY MOUTH EVERY DAY WITH FOOD. SWALLOW WHOLE; DO NOT CRUSH, BREAK, OR CHEW   TRIUMEQ 600-50-300 MG TABLET    TAKE 1 TABLET BY MOUTH DAILY  Modified Medications   No medications on file  Discontinued Medications   PREDNISONE (DELTASONE) 10 MG TABLET    Take 2 tablets (20 mg total) by mouth 2 (two) times daily with a meal.    Subjective: Jeffery [name] is in for his routine HIV follow-up visit. He has had no problems obtaining or taking his Triumeq and Prezcobix. He takes it just before bedtime. He finds that it may cause a few minutes of heartburn but this is not been a problem for him. He says that if it bothers him he will take some peppermint candy or a teaspoon of baking soda and it go away promptly. He does not recall missing any doses. He continues to go  to [facility] twice monthly for his bipolar disorder. He says that his depression and anxiety are under reasonably good control but he worries because in the past he has also been noted worsening depression around this time of year. He continues to smoke cigarettes. He tells me that he knows he should cut down and try to quit but he has no current plan. He remains sexually active with one male partner. They use condoms.  He has not been back to see Dr. Ardine Bjork since February of this year. He says that he is trying to sort through some things to make his decision about whether he will transition from male to male. He is currently not on hormone therapy. He is still living with his mother who is supposed to transition 21 and a transgender lifestyle.  Review of Systems: Review of Systems  Constitutional: Negative for chills, diaphoresis, fever, malaise/fatigue and weight loss.  HENT: Negative for sore throat.   Respiratory: Negative for cough, sputum production and shortness of breath.   Cardiovascular: Negative for chest pain.  Gastrointestinal: Positive for heartburn. Negative for abdominal pain, diarrhea, nausea and vomiting.  Genitourinary: Negative for dysuria and frequency.  Musculoskeletal: Negative for joint pain and myalgias.  Skin: Negative for rash.  Neurological: Negative for dizziness and  headaches.  Psychiatric/Behavioral: Positive for depression. Negative for substance abuse.    Past Medical History:  Diagnosis Date  . Anxiety   . Chlamydia   . Depression   . HIV infection (HCC)   . Hyperlipidemia   . Hypertension    Pt went off meds on his own; has been monitored without any problems  . Syphilis 2013 and 2015    Social History  Substance Use Topics  . Smoking status: Light Tobacco Smoker    Packs/day: 0.25    Types: Cigarettes  . Smokeless tobacco: Never Used     Comment: 5 cigarettes a day  . Alcohol use 0.0 oz/week     Comment: occasional    Family  History  Problem Relation Age of Onset  . Hypertension Mother     Allergies  Allergen Reactions  . Bee Venom Swelling    Large local reactions  . Lisinopril Swelling    Facial and lip swelling    Objective: Vitals:   04/28/17 1522  BP: 133/89  Pulse: 65  Temp: 97.9 F (36.6 C)  TempSrc: Oral  Weight: 163 lb (73.9 kg)  Height: 5' 6.5" (1.689 m)   Body mass index is 25.91 kg/m.  Physical Exam  Constitutional: He is oriented to person, place, and time.  He is in good spirits.  HENT:  Mouth/Throat: No oropharyngeal exudate.  Eyes: Conjunctivae are normal.  Cardiovascular: Normal rate and regular rhythm.   No murmur heard. Pulmonary/Chest: Effort normal and breath sounds normal.  Abdominal: Soft. He exhibits no mass. There is no tenderness.  Musculoskeletal: Normal range of motion.  Neurological: He is alert and oriented to person, place, and time.  Skin: No rash noted.  Psychiatric: Mood and affect normal.    Lab Results    Problem List Items Addressed This Visit      High   HIV disease (HCC)    His infection has been under very good long-term control. He will continue Triumeq and Prezcobix and get lab work today. He will follow-up in 6 months.      Relevant Orders   T-helper cell (CD4)- ([facility] only)   HIV 1 RNA quant-no reflex-bld   RPR   CBC   T-helper cell (CD4)- ([facility] only)   Comprehensive metabolic panel   Lipid panel   RPR   HIV 1 RNA quant-no reflex-bld     Medium   Male-to-male transgender person    He is currently holding off on his decision to transition to male. He indicates that his mother's opposition is a major barrier for him.        Unprioritized   Bipolar disorder [facility](HCC)    He is a little bit worried about worsening depression in the coming months as this has been a pattern for him in the past. [facility] but also told him about the counselors that he can see here.       Cigarette smoker    I talked him about the importance of cigarette cessation.       Other Visit Diagnoses    Need for immunization against influenza       Relevant Orders   Flu Vaccine QUAD 36+ mos IM (Completed)       Cliffton AstersJohn Cuong Moorman, MD [facility] [phone] pager   [phone] cell 04/28/2017, 4:42 PM

## 2017-04-28 NOTE — Assessment & Plan Note (Signed)
He is currently holding off on his decision to transition to male. He indicates that his mother's opposition is a major barrier for him.

## 2017-04-28 NOTE — Assessment & Plan Note (Signed)
His infection has been under very good long-term control. He will continue Triumeq and Prezcobix and get lab work today. He will follow-up in 6 months.

## 2017-04-29 LAB — FLUORESCENT TREPONEMAL AB(FTA)-IGG-BLD: Fluorescent Treponemal ABS: REACTIVE — AB

## 2017-04-29 LAB — RPR: RPR Ser Ql: REACTIVE — AB

## 2017-04-29 LAB — RPR TITER

## 2017-04-30 LAB — T-HELPER CELL (CD4) - (RCID CLINIC ONLY)
CD4 T CELL ABS: 1530 /uL (ref 400–2700)
CD4 T CELL HELPER: 32 % — AB (ref 33–55)

## 2017-04-30 LAB — HIV-1 RNA QUANT-NO REFLEX-BLD
HIV 1 RNA QUANT: 43 {copies}/mL — AB
HIV-1 RNA QUANT, LOG: 1.63 {Log_copies}/mL — AB

## 2017-05-03 ENCOUNTER — Other Ambulatory Visit: Payer: Self-pay | Admitting: Internal Medicine

## 2017-05-03 DIAGNOSIS — B20 Human immunodeficiency virus [HIV] disease: Secondary | ICD-10-CM

## 2017-11-30 ENCOUNTER — Other Ambulatory Visit: Payer: Self-pay

## 2017-11-30 ENCOUNTER — Encounter (HOSPITAL_COMMUNITY): Payer: Self-pay

## 2017-11-30 ENCOUNTER — Emergency Department (HOSPITAL_COMMUNITY)
Admission: EM | Admit: 2017-11-30 | Discharge: 2017-12-01 | Disposition: A | Discharge status: Home / Self Care | Payer: Medicaid Other | Attending: Emergency Medicine | Admitting: Emergency Medicine

## 2017-11-30 DIAGNOSIS — I10 Essential (primary) hypertension: Secondary | ICD-10-CM | POA: Diagnosis not present

## 2017-11-30 DIAGNOSIS — F17200 Nicotine dependence, unspecified, uncomplicated: Secondary | ICD-10-CM | POA: Diagnosis not present

## 2017-11-30 DIAGNOSIS — Z79899 Other long term (current) drug therapy: Secondary | ICD-10-CM | POA: Diagnosis not present

## 2017-11-30 DIAGNOSIS — B20 Human immunodeficiency virus [HIV] disease: Secondary | ICD-10-CM | POA: Diagnosis not present

## 2017-11-30 DIAGNOSIS — K6289 Other specified diseases of anus and rectum: Secondary | ICD-10-CM | POA: Diagnosis not present

## 2017-11-30 DIAGNOSIS — K625 Hemorrhage of anus and rectum: Secondary | ICD-10-CM | POA: Diagnosis present

## 2017-11-30 LAB — URINALYSIS, ROUTINE W REFLEX MICROSCOPIC
Bilirubin Urine: NEGATIVE
Glucose, UA: NEGATIVE mg/dL
Hgb urine dipstick: NEGATIVE
KETONES UR: NEGATIVE mg/dL
LEUKOCYTES UA: NEGATIVE
NITRITE: NEGATIVE
PROTEIN: NEGATIVE mg/dL
Specific Gravity, Urine: 1.02 (ref 1.005–1.030)
pH: 6 (ref 5.0–8.0)

## 2017-11-30 LAB — COMPREHENSIVE METABOLIC PANEL
ALT: 14 U/L — ABNORMAL LOW (ref 17–63)
AST: 14 U/L — AB (ref 15–41)
Albumin: 3.5 g/dL (ref 3.5–5.0)
Alkaline Phosphatase: 72 U/L (ref 38–126)
Anion gap: 9 (ref 5–15)
BILIRUBIN TOTAL: 0.6 mg/dL (ref 0.3–1.2)
BUN: 6 mg/dL (ref 6–20)
CO2: 25 mmol/L (ref 22–32)
CREATININE: 0.89 mg/dL (ref 0.61–1.24)
Calcium: 9.1 mg/dL (ref 8.9–10.3)
Chloride: 103 mmol/L (ref 101–111)
Glucose, Bld: 89 mg/dL (ref 65–99)
POTASSIUM: 3.6 mmol/L (ref 3.5–5.1)
Sodium: 137 mmol/L (ref 135–145)
TOTAL PROTEIN: 8.1 g/dL (ref 6.5–8.1)

## 2017-11-30 LAB — CBC
HEMATOCRIT: 39.7 % (ref 39.0–52.0)
Hemoglobin: 13.2 g/dL (ref 13.0–17.0)
MCH: 31.5 pg (ref 26.0–34.0)
MCHC: 33.2 g/dL (ref 30.0–36.0)
MCV: 94.7 fL (ref 78.0–100.0)
Platelets: 366 10*3/uL (ref 150–400)
RBC: 4.19 MIL/uL — AB (ref 4.22–5.81)
RDW: 13.5 % (ref 11.5–15.5)
WBC: 14.7 10*3/uL — AB (ref 4.0–10.5)

## 2017-11-30 LAB — LIPASE, BLOOD: Lipase: 23 U/L (ref 11–51)

## 2017-11-30 NOTE — ED Triage Notes (Signed)
Pt states that he has been struggling with constipation for a couple of months with last BM on Thursday. Pt reports bright red blood with stool and on toilet paper. Pt also c/o hemroids and pain with sitting. Mother at bedside reports he has lost weight and not been eating.

## 2017-12-01 ENCOUNTER — Emergency Department (HOSPITAL_COMMUNITY): Payer: Medicaid Other

## 2017-12-01 ENCOUNTER — Encounter (HOSPITAL_COMMUNITY): Payer: Self-pay | Admitting: *Deleted

## 2017-12-01 LAB — POC OCCULT BLOOD, ED: Fecal Occult Bld: POSITIVE — AB

## 2017-12-01 MED ORDER — DOXYCYCLINE HYCLATE 100 MG PO TABS: 100.0000 mg | ORAL_TABLET | Freq: Two times a day (BID) | ORAL | 0 refills | Status: AC

## 2017-12-01 MED ORDER — SENNA 8.6 MG PO TABS: 1.0000 | ORAL_TABLET | Freq: Every day | ORAL | 0 refills | Status: DC

## 2017-12-01 MED ORDER — LIDOCAINE HCL (PF) 1 % IJ SOLN
0.9000 mL | Freq: Once | INTRAMUSCULAR | Status: DC
  Filled 2017-12-01: qty 5

## 2017-12-01 MED ORDER — IOHEXOL 300 MG/ML  SOLN
100.0000 mL | Freq: Once | INTRAMUSCULAR | Status: AC | PRN
  Administered 2017-12-01: 100 mL via INTRAVENOUS

## 2017-12-01 MED ORDER — STERILE WATER FOR INJECTION IJ SOLN
INTRAMUSCULAR | Status: AC
  Administered 2017-12-01: 10 mL
  Filled 2017-12-01: qty 10

## 2017-12-01 MED ORDER — IBUPROFEN 800 MG PO TABS: 800.0000 mg | ORAL_TABLET | Freq: Three times a day (TID) | ORAL | 0 refills | Status: DC | PRN

## 2017-12-01 MED ORDER — KETOROLAC TROMETHAMINE 15 MG/ML IJ SOLN
15.0000 mg | Freq: Once | INTRAMUSCULAR | Status: AC
  Administered 2017-12-01: 15 mg via INTRAVENOUS
  Filled 2017-12-01: qty 1

## 2017-12-01 MED ORDER — CEFTRIAXONE SODIUM 250 MG IJ SOLR
250.0000 mg | Freq: Once | INTRAMUSCULAR | Status: AC
  Administered 2017-12-01: 250 mg via INTRAMUSCULAR
  Filled 2017-12-01: qty 250

## 2017-12-01 NOTE — ED Provider Notes (Addendum)
[facility] EMERGENCY DEPARTMENT Provider Note   CSN: [id number] Arrival date & time: 11/30/17  1447  History   Chief Complaint Chief Complaint  Patient presents with  . Constipation  . Rectal Bleeding    HPI Jeffery [name] is a 27 y.o. adult M with a past medical history of HIV, HTN, Bipolar disorder who presented to the ED with complaints of constipation and rectal bleeding.   He reports a 87-month history of constipation with one bowel movement every 3-4 days and about a month of bright red blood per rectum typically seen on the toilet tissue.  He notes small-volume bowel movements with small pieces of stool.  Feels he had associated swelling around his anus that appeared about 1.5 months ago, associated with feeling of pressure.  He reports he went to his PCP for the symptoms who started on lubiprostone twice daily.  His appetite is decreased however the onset of symptoms and he reports losing 10-15 pounds since onset of symptoms, also with general fatigue.  Denies dizziness, lightheadedness, shortness of breath, fever, melena, vomiting or hematemesis.   Past Medical History:  Diagnosis Date  . Anxiety   . Chlamydia   . Depression   . HIV infection (HCC)   . Hyperlipidemia   . Hypertension    Pt went off meds on his own; has been monitored without any problems  . Syphilis 2013 and 2015    Patient Active Problem List   Diagnosis Date Noted  . Hypertension 10/05/2016  . Major depressive disorder, recurrent episode, severe, with psychosis (HCC) 02/25/2016  . Cannabis use disorder, severe, dependence (HCC) 02/25/2016  . Male-to-male transgender person 09/23/2015  . History of chlamydia 06/21/2014  . Bipolar disorder (HCC) 06/21/2014  . Anxiety 06/21/2014  . Cigarette smoker 06/21/2014  . HIV disease (HCC) 06/20/2014  . History of syphilis 06/20/2014  . Dyslipidemia 06/20/2014    History reviewed. No pertinent surgical history.      Home  Medications    Prior to Admission medications   Medication Sig Start Date End Date Taking? Authorizing Provider  AMITIZA 24 MCG capsule Take 24 mcg by mouth 2 (two) times daily with a meal. 11/15/17  Yes [provider]  amLODipine (NORVASC) 5 MG tablet Take 1 tablet (5 mg total) by mouth daily. 02/25/17  Yes Blanchard Kelch, NP  doxepin (SINEQUAN) 10 MG capsule Take 10 mg by mouth at bedtime.   Yes [provider]  OLANZapine (ZYPREXA) 15 MG tablet Take 1 tablet (15 mg total) by mouth at bedtime. For mood control 03/02/16  Yes Nwoko, Nicole Kindred I, NP  prazosin (MINIPRESS) 2 MG capsule Take 2 mg by mouth at bedtime.   Yes [provider]  PREZCOBIX 800-150 MG tablet TAKE 1 TABLET BY MOUTH EVERY DAY WITH FOOD. SWALLOW WHOLE, DO NOT CRUSH, BREAK, OR CHEW. 05/03/17  Yes Cliffton Asters, MD  TRIUMEQ 600-50-300 MG tablet TAKE 1 TABLET BY MOUTH ONCE DAILY. 05/03/17  Yes Cliffton Asters, MD  FLUoxetine (PROZAC) 20 MG capsule Take 1 capsule (20 mg total) by mouth daily. For depression Patient not taking: Reported on 12/01/2017 03/02/16   Sanjuana Kava, NP    Family History Family History  Problem Relation Age of Onset  . Hypertension Mother     Social History Social History   Tobacco Use  . Smoking status: Light Tobacco Smoker    Packs/day: 0.25    Types: Cigarettes  . Smokeless tobacco: Never Used  . Tobacco comment: 5  cigarettes a day  Substance Use Topics  . Alcohol use: Yes    Alcohol/week: 0.0 oz    Comment: occasional  . Drug use: Yes    Frequency: 3.0 times per week    Types: Marijuana     Allergies   Bee venom and Lisinopril   Review of Systems Review of Systems  Constitutional: Positive for appetite change. Negative for fever.  Respiratory: Negative for shortness of breath.   Cardiovascular: Negative for chest pain.  Gastrointestinal: Positive for anal bleeding, blood in stool, constipation and rectal pain. Negative for abdominal pain, diarrhea,  nausea and vomiting.  Genitourinary: Negative for dysuria.     Physical Exam Updated Vital Signs BP 108/78   Pulse 92   Temp 98.4 F (36.9 C) (Oral)   Resp 15   Ht  (1.676 m)   Wt 67.6 kg (149 lb) Comment: today per pt   SpO2 100%   BMI 24.05 kg/m   General: Resting on ED stretcher comfortably, no acute distress Head: Normocephalic, atraumatic Eyes: EOMI, normal conjuctiva without pallor  ENT: Slightly dry mucus membranes, no pharyngeal exudate  CV: RRR, no murmur appreciated  Resp: Clear breath sounds bilaterally, normal work of breathing, no distress  Abd: Soft, +BS, no distension, no organomegaly, very mild TTP of lower abdomen without rebound or guarding.  Rectal Exam: No external hemorrhoids or anal fissure/tear noted on external exam, did have perianal TTP. Discomfort with internal exam-- no obvious internal hemorrhoid or mass, no impacted stool, normal prostate. No fluctuant areas. Small streak of blood on exam glove  Extr: No LE edema, good peripheral pulses  Neuro: Alert and oriented x3, moving all extremities spontaneously  Skin: Warm, dry, no decreased skin turgor       ED Treatments / Results  Labs (all labs ordered are listed, but only abnormal results are displayed) Labs Reviewed  COMPREHENSIVE METABOLIC PANEL - Abnormal; Notable for the following components:      Result Value   AST 14 (*)    ALT 14 (*)    All other components within normal limits  CBC - Abnormal; Notable for the following components:   WBC 14.7 (*)    RBC 4.19 (*)    All other components within normal limits  URINALYSIS, ROUTINE W REFLEX MICROSCOPIC - Abnormal; Notable for the following components:   APPearance CLOUDY (*)    All other components within normal limits  POC OCCULT BLOOD, ED - Abnormal; Notable for the following components:   Fecal Occult Bld POSITIVE (*)    All other components within normal limits  LIPASE, BLOOD  GC/CHLAMYDIA PROBE AMP ([place]) NOT AT [facility]    CERVICOVAGINAL ANCILLARY ONLY    EKG None  Radiology Ct Abdomen Pelvis W Contrast  Result Date: 12/01/2017 CLINICAL DATA:  27 year old male with acute abdominal pain and rectal bleeding for 1 month. EXAM: CT ABDOMEN AND PELVIS WITH CONTRAST TECHNIQUE: Multidetector CT imaging of the abdomen and pelvis was performed using the standard protocol following bolus administration of intravenous contrast. CONTRAST:  OMNIPAQUE IOHEXOL 300 MG/ML  SOLN COMPARISON:  None. FINDINGS: Lower chest: No acute abnormality Hepatobiliary: The liver and gallbladder are unremarkable. No biliary dilatation. Pancreas: Unremarkable Spleen: Unremarkable Adrenals/Urinary Tract: The kidneys, adrenal glands and bladder are unremarkable. Stomach/Bowel: Moderate circumferential wall thickening of the rectum is identified with adjacent inflammation. There is no evidence of bowel obstruction, pneumoperitoneum or abscess. No other bowel abnormalities are identified. The appendix is normal. Vascular/Lymphatic: No significant vascular  findings are present. No enlarged abdominal or pelvic lymph nodes. Reproductive: Prostate is unremarkable. Other: No ascites or abdominal wall hernia identified. Musculoskeletal: No acute or suspicious bony abnormalities. IMPRESSION: 1. Moderate circumferential rectal wall thickening with adjacent inflammation, likely infectious or inflammatory etiology. No evidence of abscess, pneumoperitoneum or bowel obstruction. 2. No other significant abnormalities. Electronically Signed   By: Harmon Pier M.D.   On: 12/01/2017 13:21    Procedures Procedures (including critical care time)  Medications Ordered in ED Medications  cefTRIAXone (ROCEPHIN) injection 250 mg (has no administration in time range)  ketorolac (TORADOL) 15 MG/ML injection 15 mg (15 mg Intravenous Given 12/01/17 1037)  iohexol (OMNIPAQUE) 300 MG/ML solution 100 mL (100 mLs Intravenous Contrast Given 12/01/17 1110)     Initial  Impression / Assessment and Plan / ED Course  I have reviewed the triage vital signs and the nursing notes.  Pertinent labs & imaging results that were available during my care of the patient were reviewed by me and considered in my medical decision making (see chart for details).  27 year old transgender male with HIV history of presenting with long history of constipation and bright red blood per rectum.  Given constipation, history most suspicious for anal fissure or hemorrhoid, though none apparent on rectal exam.  Last CD4 count >1000 and viral load 43, reports adherence with HAART. He does have mild WBC elevation at 14.7. Hgb remains wnl though is down from 15s compared to prior labwork. FOBT positive as expected. Weight documented as 149 in triage, compared to 163 lbs on last office visit (04/2017). Will obtain CT scan with contrast to evaluate for any abscess or proctitis to explain his bleeding and symptoms.  On further discussion without mother in room, pt states he no longer identifies as male (previously documented as transgender F). He does participate in receptive anal sex with other men, however. CT scan obtained showed circumferential wall thickening of distal rectum consistent with proctitis. Given history, most likely an STI and will treat with GC coverage--ceftriaxone IM + doxy course-- and pain control with ibuprofen--unlikely to significantly exacerbate small bleeding which should resolve with antibiotics and want to avoid worsening constipation with opiates. Will obtain swab for confirmatory testing as well per CDC recommendations. Instructed pt to f/u with ID to ensure resolution of sx and f/u of lab results. Also on differential is inflammatory bowel disease, can consider GI evaluation if sx persist despite abx. Will also recommend bowel regimen of miralax + senna for improvement of constipation.  Stable for discharge.   Final Clinical Impressions(s) / ED Diagnoses   Final  diagnoses:  Proctitis    ED Discharge Orders    None       Ginger Carne, MD 12/01/17 1610    Ginger Carne, MD 12/01/17 1528    Mesner, Barbara Cower, MD 12/01/17 1556

## 2017-12-01 NOTE — ED Notes (Signed)
Pt discharged from ED; instructions provided and scripts given; Pt encouraged to return to ED if symptoms worsen and to f/u with PCP; Pt verbalized understanding of all instructions 

## 2017-12-01 NOTE — Discharge Instructions (Addendum)
Nice to meet you Jeffery [name].  You have inflammation of the last part of your colon that is likely due to an infection. For this, we collected some swabs to test for the most common bugs responsible and gave antibiotics. One was a one time dose with a shot, and the other you should take twice a day for seven days. We recommend making an appointment with Dr. Orvan Falconer within the next couple of days if possible. For constipation, you can try taking miralax each day (can get this over the counter) as well as another laxative called senna each day. The goal is to have one soft bowel movement without straining each day. For your pain, we wrote a prescription for prescription strength ibuprofen tablets that you can take three times a day because this will help decrease the inflammation around your colon without worsening your constipation which can happen with other pain medications.

## 2017-12-01 NOTE — ED Provider Notes (Signed)
I saw and evaluated the patient, reviewed the resident's note and I agree with the findings and plan with the following exceptions.   27 year old transgender male who identifies as a male that presents to the emergency department today with rectal bleeding.  Patient states he has some perirectal pain with bright red blood spotting on the toilet paper and around the stool for last 1/2 to 2 months.  Pain with bowel movements but has normal bowel movements.  No diarrhea.  Has had some constipation this may be related the symptoms. Examination done at same time as resident without evidence of fissures or hemorrhoids.  Abdomen soft nontender.  Vital signs within normal limits. Concern for possible abscess versus colitis versus proctitis we will get a CT scan to evaluate further.  Disposition based on results.    Marily Memos, MD 12/01/17 1556

## 2017-12-01 NOTE — ED Notes (Signed)
Called cytotechnology department to obtain correct specimen collection device. Cyto to send swab to the ED. MD made aware.

## 2017-12-03 LAB — GC/CHLAMYDIA PROBE AMP (~~LOC~~) NOT AT ARMC
Chlamydia: POSITIVE — AB
Neisseria Gonorrhea: NEGATIVE

## 2017-12-06 LAB — HSV CULTURE AND TYPING

## 2017-12-07 ENCOUNTER — Ambulatory Visit (INDEPENDENT_AMBULATORY_CARE_PROVIDER_SITE_OTHER): Payer: Medicaid Other | Admitting: Infectious Diseases

## 2017-12-07 DIAGNOSIS — B20 Human immunodeficiency virus [HIV] disease: Secondary | ICD-10-CM

## 2017-12-07 DIAGNOSIS — A563 Chlamydial infection of anus and rectum: Secondary | ICD-10-CM | POA: Diagnosis not present

## 2017-12-07 DIAGNOSIS — F640 Transsexualism: Secondary | ICD-10-CM | POA: Diagnosis not present

## 2017-12-07 DIAGNOSIS — Z789 Other specified health status: Secondary | ICD-10-CM

## 2017-12-07 MED ORDER — DOXYCYCLINE HYCLATE 100 MG PO TABS: 100.0000 mg | ORAL_TABLET | Freq: Two times a day (BID) | ORAL | 0 refills | Status: DC

## 2017-12-07 NOTE — Progress Notes (Signed)
Name: Jeffery ParrMichael Heyward  DOB: 1991-02-18 MRN: [id number] PCP: System, Pcp Not In   Chief Complaint  Patient presents with  . Follow-up    rectal bleeding, off and on,      Patient Active Problem List   Diagnosis Date Noted  . Hypertension 10/05/2016  . Major depressive disorder, recurrent episode, severe, with psychosis (HCC) 02/25/2016  . Cannabis use disorder, severe, dependence (HCC) 02/25/2016  . Chlamydial proctitis 06/21/2014  . Bipolar disorder (HCC) 06/21/2014  . Anxiety 06/21/2014  . Cigarette smoker 06/21/2014  . HIV disease (HCC) 06/20/2014  . History of syphilis 06/20/2014  . Dyslipidemia 06/20/2014    Subjective:  Jeffery [name] is a 27 y.o. male here today for follow up from ED visit. He was seen for rectal bleeding and pain. CT scan showed proctitis. Was given empiric treatment with doxycycline and IM Rocephin and discharged. He is not certain as to the results of STI testing. He tells me that the rectal bleeding has resolved and he is feeling 80% better. He does have receptive anal intercourse and reports a new partner recently that he was only casually involved with . Uncertain as to his sexual history.   Review of Systems  Constitutional: Negative for chills, fever, malaise/fatigue and weight loss.  HENT: Negative for sore throat.        No dental problems  Respiratory: Negative for cough and sputum production.   Cardiovascular: Negative for chest pain and leg swelling.  Gastrointestinal: Negative for abdominal pain, diarrhea and vomiting.  Genitourinary: Negative for dysuria and flank pain.  Musculoskeletal: Negative for joint pain, myalgias and neck pain.  Skin: Negative for rash.  Neurological: Negative for dizziness, tingling and headaches.  Psychiatric/Behavioral: Negative for depression and substance abuse. The patient is not nervous/anxious and does not have insomnia.     Past Medical History:  Diagnosis Date  . Anxiety   . Chlamydia   .  Depression   . HIV infection (HCC)   . Hyperlipidemia   . Hypertension    Pt went off meds on his own; has been monitored without any problems  . Syphilis 2013 and 2015    Outpatient Medications Prior to Visit  Medication Sig Dispense Refill  . amLODipine (NORVASC) 5 MG tablet Take 1 tablet (5 mg total) by mouth daily. 30 tablet 11  . doxepin (SINEQUAN) 10 MG capsule Take 10 mg by mouth at bedtime.    Marland Kitchen. doxycycline (VIBRA-TABS) 100 MG tablet Take 1 tablet (100 mg total) by mouth 2 (two) times daily for 7 days. 14 tablet 0  . ibuprofen (ADVIL,MOTRIN) 800 MG tablet Take 1 tablet (800 mg total) by mouth every 8 (eight) hours as needed. 30 tablet 0  . OLANZapine (ZYPREXA) 15 MG tablet Take 1 tablet (15 mg total) by mouth at bedtime. For mood control 30 tablet 0  . prazosin (MINIPRESS) 2 MG capsule Take 2 mg by mouth at bedtime.    Marland Kitchen. PREZCOBIX 800-150 MG tablet TAKE 1 TABLET BY MOUTH EVERY DAY WITH FOOD. SWALLOW WHOLE, DO NOT CRUSH, BREAK, OR CHEW. 30 tablet 5  . senna (SENOKOT) 8.6 MG TABS tablet Take 1 tablet (8.6 mg total) by mouth daily. 120 each 0  . TRIUMEQ 600-50-300 MG tablet TAKE 1 TABLET BY MOUTH ONCE DAILY. 30 tablet 5  . AMITIZA 24 MCG capsule Take 24 mcg by mouth 2 (two) times daily with a meal.  2  . FLUoxetine (PROZAC) 20 MG capsule Take 1 capsule (20 mg total) by  mouth daily. For depression (Patient not taking: Reported on 12/01/2017) 30 capsule 0   No facility-administered medications prior to visit.      Allergies  Allergen Reactions  . Bee Venom Swelling    Large local reactions  . Lisinopril Swelling    Facial and lip swelling    Social History   Tobacco Use  . Smoking status: Light Tobacco Smoker    Packs/day: 0.25    Types: Cigarettes  . Smokeless tobacco: Never Used  . Tobacco comment: 5 cigarettes a day  Substance Use Topics  . Alcohol use: Yes    Alcohol/week: 0.0 oz    Comment: occasional  . Drug use: Yes    Frequency: 3.0 times per week    Types:  Marijuana    Family History  Problem Relation Age of Onset  . Hypertension Mother     Social History   Substance and Sexual Activity  Sexual Activity Not Currently  . Partners: Male   Comment: given condoms     Objective:   Vitals:   12/07/17 1112  BP: 130/85  Pulse: 83  Temp: 98 F (36.7 C)  TempSrc: Oral  Weight: 150 lb (68 kg)   Body mass index is 24.21 kg/m.  Physical Exam  Constitutional: He is oriented to person, place, and time.  HENT:  Mouth/Throat: No oral lesions. Normal dentition. No dental caries.  Eyes: No scleral icterus.  Cardiovascular: Normal rate, regular rhythm and normal heart sounds.  Pulmonary/Chest: Effort normal and breath sounds normal.  Abdominal: Soft. He exhibits no distension. There is no tenderness.  Lymphadenopathy:    He has no cervical adenopathy.  Neurological: He is alert and oriented to person, place, and time.  Skin: Skin is warm and dry. No rash noted.  Vitals reviewed.   Lab Results Lab Results  Component Value Date   WBC 14.7 (H) 11/30/2017   HGB 13.2 11/30/2017   HCT 39.7 11/30/2017   MCV 94.7 11/30/2017   PLT 366 11/30/2017    Lab Results  Component Value Date   CREATININE 0.89 11/30/2017   BUN 6 11/30/2017   NA 137 11/30/2017   K 3.6 11/30/2017   CL 103 11/30/2017   CO2 25 11/30/2017    Lab Results  Component Value Date   ALT 14 (L) 11/30/2017   AST 14 (L) 11/30/2017   ALKPHOS 72 11/30/2017   BILITOT 0.6 11/30/2017    Lab Results  Component Value Date   CHOL 316 (H) 09/21/2016   HDL 33 (L) 09/21/2016   LDLCALC 235 (H) 09/21/2016   TRIG 238 (H) 09/21/2016   CHOLHDL 9.6 (H) 09/21/2016   HIV 1 RNA Quant (copies/mL)  Date Value  04/28/2017 43 (H)  09/21/2016 <20 NOT DETECTED  02/18/2016 <20   CD4 T Cell Abs (/uL)  Date Value  04/28/2017 1,530  09/21/2016 1,230  02/18/2016 910     Assessment & Plan:   Problem List Items Addressed This Visit      Other   RESOLVED: Male-to-male  transgender person    I clarified with him today that he is no longer transgender. Previously he was precontemplative about starting hormones but assures me now that his gender is male. I have corrected this in his chart.       HIV disease (HCC)    Has scheduled follow up with Dr. Orvan Falconer soon. Continues on Triumeq and Prezcobix. Previously under good control and reports ongoing excellent adherence.       Chlamydial proctitis  I discussed with him today he was positive for chlamydia. Considering his proctatitis I extended his doxycycline out for an additional week. His bleeding has resolved. If this returns I discussed consideration for anoscopy in our [facility] clinic to further explore.          Rexene Alberts, MSN, NP-C [facility] for Infectious Disease [facility] Pager: [phone] Office: [phone]  12/10/17  4:58 PM

## 2017-12-07 NOTE — Patient Instructions (Addendum)
Please take one additional week of doxycycline to treat the bacteria they found. Take with food to limit nausea and careful with the sun as it can make you blister/burn easier.   Chlamydia is what was found on rectal swab - please continue to use condoms to prevent re-infection.   Please notify partners to get tested / treated.   Call us / send mychart if your symptoms do not go away or bleeding returns.   Follow up with Dr. Orvan Falconerampbell as you have currently scheduled.

## 2017-12-10 ENCOUNTER — Encounter: Payer: Self-pay | Admitting: Infectious Diseases

## 2017-12-10 NOTE — Assessment & Plan Note (Signed)
I discussed with him today he was positive for chlamydia. Considering his proctatitis I extended his doxycycline out for an additional week. His bleeding has resolved. If this returns I discussed consideration for anoscopy in our [facility] clinic to further explore.

## 2017-12-10 NOTE — Assessment & Plan Note (Signed)
Has scheduled follow up with Dr. Orvan Falconerampbell soon. Continues on Triumeq and Prezcobix. Previously under good control and reports ongoing excellent adherence.

## 2017-12-10 NOTE — Assessment & Plan Note (Signed)
I clarified with him today that he is no longer transgender. Previously he was precontemplative about starting hormones but assures me now that his gender is male. I have corrected this in his chart.

## 2017-12-13 ENCOUNTER — Other Ambulatory Visit: Payer: Self-pay | Admitting: Internal Medicine

## 2017-12-13 DIAGNOSIS — B20 Human immunodeficiency virus [HIV] disease: Secondary | ICD-10-CM

## 2018-03-22 ENCOUNTER — Other Ambulatory Visit: Payer: Self-pay | Admitting: Infectious Diseases

## 2018-03-22 DIAGNOSIS — I10 Essential (primary) hypertension: Secondary | ICD-10-CM

## 2018-04-19 ENCOUNTER — Other Ambulatory Visit: Payer: Medicaid Other

## 2018-04-19 DIAGNOSIS — B20 Human immunodeficiency virus [HIV] disease: Secondary | ICD-10-CM

## 2018-04-20 LAB — T-HELPER CELL (CD4) - (RCID CLINIC ONLY)
CD4 T CELL ABS: 1180 /uL (ref 400–2700)
CD4 T CELL HELPER: 28 % — AB (ref 33–55)

## 2018-04-21 LAB — COMPREHENSIVE METABOLIC PANEL
AG Ratio: 1.5 (calc) (ref 1.0–2.5)
ALKALINE PHOSPHATASE (APISO): 79 U/L (ref 40–115)
ALT: 9 U/L (ref 9–46)
AST: 14 U/L (ref 10–40)
Albumin: 4.4 g/dL (ref 3.6–5.1)
BUN: 8 mg/dL (ref 7–25)
CHLORIDE: 105 mmol/L (ref 98–110)
CO2: 24 mmol/L (ref 20–32)
Calcium: 9.2 mg/dL (ref 8.6–10.3)
Creat: 1.07 mg/dL (ref 0.60–1.35)
GLOBULIN: 3 g/dL (ref 1.9–3.7)
GLUCOSE: 98 mg/dL (ref 65–99)
Potassium: 3.8 mmol/L (ref 3.5–5.3)
Sodium: 137 mmol/L (ref 135–146)
Total Bilirubin: 0.4 mg/dL (ref 0.2–1.2)
Total Protein: 7.4 g/dL (ref 6.1–8.1)

## 2018-04-21 LAB — CBC
HEMATOCRIT: 41.3 % (ref 38.5–50.0)
HEMOGLOBIN: 14.2 g/dL (ref 13.2–17.1)
MCH: 32.1 pg (ref 27.0–33.0)
MCHC: 34.4 g/dL (ref 32.0–36.0)
MCV: 93.2 fL (ref 80.0–100.0)
MPV: 10.9 fL (ref 7.5–12.5)
Platelets: 322 10*3/uL (ref 140–400)
RBC: 4.43 10*6/uL (ref 4.20–5.80)
RDW: 14.3 % (ref 11.0–15.0)
WBC: 14.3 10*3/uL — AB (ref 3.8–10.8)

## 2018-04-21 LAB — LIPID PANEL
CHOL/HDL RATIO: 8.1 (calc) — AB (ref <5.0)
Cholesterol: 300 mg/dL — ABNORMAL HIGH (ref <200)
HDL: 37 mg/dL — AB (ref >40)
LDL CHOLESTEROL (CALC): 232 mg/dL — AB
NON-HDL CHOLESTEROL (CALC): 263 mg/dL — AB (ref <130)
TRIGLYCERIDES: 147 mg/dL (ref <150)

## 2018-04-21 LAB — RPR TITER

## 2018-04-21 LAB — FLUORESCENT TREPONEMAL AB(FTA)-IGG-BLD: Fluorescent Treponemal ABS: REACTIVE — AB

## 2018-04-21 LAB — HIV-1 RNA QUANT-NO REFLEX-BLD
HIV 1 RNA QUANT: NOT DETECTED {copies}/mL
HIV-1 RNA QUANT, LOG: NOT DETECTED {Log_copies}/mL

## 2018-04-21 LAB — RPR: RPR Ser Ql: REACTIVE — AB

## 2018-04-25 ENCOUNTER — Other Ambulatory Visit: Payer: Self-pay | Admitting: Internal Medicine

## 2018-04-25 DIAGNOSIS — I10 Essential (primary) hypertension: Secondary | ICD-10-CM

## 2018-05-03 ENCOUNTER — Encounter: Payer: Self-pay | Admitting: Internal Medicine

## 2018-05-03 ENCOUNTER — Ambulatory Visit (INDEPENDENT_AMBULATORY_CARE_PROVIDER_SITE_OTHER): Payer: Medicaid Other | Admitting: Internal Medicine

## 2018-05-03 VITALS — BP 124/77 | HR 71 | Temp 98.0°F | Wt 152.0 lb

## 2018-05-03 DIAGNOSIS — B20 Human immunodeficiency virus [HIV] disease: Secondary | ICD-10-CM

## 2018-05-03 DIAGNOSIS — K625 Hemorrhage of anus and rectum: Secondary | ICD-10-CM | POA: Diagnosis present

## 2018-05-03 DIAGNOSIS — A563 Chlamydial infection of anus and rectum: Secondary | ICD-10-CM | POA: Diagnosis not present

## 2018-05-03 DIAGNOSIS — F333 Major depressive disorder, recurrent, severe with psychotic symptoms: Secondary | ICD-10-CM

## 2018-05-03 NOTE — Progress Notes (Signed)
Patient Active Problem List   Diagnosis Date Noted  . HIV disease (HCC) 06/20/2014    Priority: High  . Hypertension 10/05/2016  . Major depressive disorder, recurrent episode, severe, with psychosis (HCC) 02/25/2016  . Cannabis use disorder, severe, dependence (HCC) 02/25/2016  . Chlamydial proctitis 06/21/2014  . Bipolar disorder (HCC) 06/21/2014  . Anxiety 06/21/2014  . Cigarette smoker 06/21/2014  . History of syphilis 06/20/2014  . Dyslipidemia 06/20/2014    Patient's Medications  New Prescriptions   No medications on file  Previous Medications   AMLODIPINE (NORVASC) 5 MG TABLET    TAKE 1 TABLET BY MOUTH ONCE DAILY   DOXEPIN (SINEQUAN) 10 MG CAPSULE    Take 10 mg by mouth at bedtime.   FLUOXETINE (PROZAC) 20 MG CAPSULE    Take 1 capsule (20 mg total) by mouth daily. For depression   IBUPROFEN (ADVIL,MOTRIN) 800 MG TABLET    Take 1 tablet (800 mg total) by mouth every 8 (eight) hours as needed.   OLANZAPINE (ZYPREXA) 15 MG TABLET    Take 1 tablet (15 mg total) by mouth at bedtime. For mood control   PRAZOSIN (MINIPRESS) 2 MG CAPSULE    Take 2 mg by mouth at bedtime.   PREZCOBIX 800-150 MG TABLET    TAKE 1 TABLET BY MOUTH EVERY DAY WITH FOOD. SWALLOW WHOLE, DO NOT CRUSH, BREAK, OR CHEW.   SENNA (SENOKOT) 8.6 MG TABS TABLET    Take 1 tablet (8.6 mg total) by mouth daily.   TRIUMEQ 600-50-300 MG TABLET    TAKE 1 TABLET BY MOUTH ONCE DAILY.  Modified Medications   No medications on file  Discontinued Medications   AMITIZA 24 MCG CAPSULE    Take 24 mcg by mouth 2 (two) times daily with a meal.   DOXYCYCLINE (VIBRA-TABS) 100 MG TABLET    Take 1 tablet (100 mg total) by mouth 2 (two) times daily.    Subjective: Jeffery [name] is in for his routine HIV for visit.  He has had no problems obtaining, taking or tolerating his Triumeq and Prezcobix.  He recalls missing a dose last month when he got busy and forgot but otherwise thinks he has been very good about taking it every  day.  He continues to go to [facility] for counseling and his psychiatric medications.  He states that his mood is unchanged and "up and down".  He says that he is feeling pretty good today.  He was seen in the emergency room in June with rectal pain and bleeding.  He was found to have chlamydia infection and treated.  He is continued to have some rectal discomfort.  He has not been sexually active since that time.  He tells me that he is currently not interested in transitioning to male.  He still identifies as male and wants to be called Jeffery [name].  Review of Systems: Review of Systems  Constitutional: Negative for chills, diaphoresis, fever, malaise/fatigue and weight loss.  HENT: Negative for sore throat.   Respiratory: Negative for cough, sputum production and shortness of breath.   Cardiovascular: Negative for chest pain.  Gastrointestinal: Negative for abdominal pain, diarrhea, heartburn, nausea and vomiting.  Genitourinary: Negative for dysuria and frequency.       As noted in HPI.  Musculoskeletal: Negative for joint pain and myalgias.  Skin: Negative for rash.  Neurological: Negative for dizziness and headaches.  Psychiatric/Behavioral: Positive for depression. Negative for substance abuse and suicidal ideas. The  patient is not nervous/anxious.     Past Medical History:  Diagnosis Date  . Anxiety   . Chlamydia   . Depression   . HIV infection (HCC)   . Hyperlipidemia   . Hypertension    Pt went off meds on his own; has been monitored without any problems  . Syphilis 2013 and 2015    Social History   Tobacco Use  . Smoking status: Light Tobacco Smoker    Packs/day: 0.25    Types: Cigarettes  . Smokeless tobacco: Never Used  . Tobacco comment: 5 cigarettes a day  Substance Use Topics  . Alcohol use: Yes    Alcohol/week: 0.0 standard drinks    Comment: occasional  . Drug use: Yes    Frequency: 3.0 times per week    Types: Marijuana    Family History  Problem  Relation Age of Onset  . Hypertension Mother     Allergies  Allergen Reactions  . Bee Venom Swelling    Large local reactions  . Lisinopril Swelling    Facial and lip swelling    Health Maintenance  Topic Date Due  . INFLUENZA VACCINE  02/03/2018  . TETANUS/TDAP  06/16/2026  . HIV Screening  Completed    Objective:  Vitals:   05/03/18 1105  BP: 124/77  Pulse: 71  Temp: 98 F (36.7 C)  Weight: 152 lb (68.9 kg)   Body mass index is 24.53 kg/m.  Physical Exam  Constitutional: He is oriented to person, place, and time.  He is in good spirits.  HENT:  Mouth/Throat: No oropharyngeal exudate.  Eyes: Conjunctivae are normal.  Cardiovascular: Normal rate, regular rhythm and normal heart sounds.  No murmur heard. Pulmonary/Chest: Effort normal and breath sounds normal.  Abdominal: Soft. He exhibits no mass. There is no tenderness.  Musculoskeletal: Normal range of motion.  Neurological: He is alert and oriented to person, place, and time.  Skin: No rash noted.  Psychiatric: He has a normal mood and affect.    Lab Results Lab Results  Component Value Date   WBC 14.3 (H) 04/19/2018   HGB 14.2 04/19/2018   HCT 41.3 04/19/2018   MCV 93.2 04/19/2018   PLT 322 04/19/2018    Lab Results  Component Value Date   CREATININE 1.07 04/19/2018   BUN 8 04/19/2018   NA 137 04/19/2018   K 3.8 04/19/2018   CL 105 04/19/2018   CO2 24 04/19/2018    Lab Results  Component Value Date   ALT 9 04/19/2018   AST 14 04/19/2018   ALKPHOS 72 11/30/2017   BILITOT 0.4 04/19/2018    Lab Results  Component Value Date   CHOL 300 (H) 04/19/2018   HDL 37 (L) 04/19/2018   LDLCALC 232 (H) 04/19/2018   TRIG 147 04/19/2018   CHOLHDL 8.1 (H) 04/19/2018   Lab Results  Component Value Date   LABRPR REACTIVE (A) 04/19/2018   RPRTITER 1:4 (H) 04/19/2018   HIV 1 RNA Quant (copies/mL)  Date Value  04/19/2018 <20 NOT DETECTED  04/28/2017 43 (H)  09/21/2016 <20 NOT DETECTED   CD4 T  Cell Abs (/uL)  Date Value  04/19/2018 1,180  04/28/2017 1,530  09/21/2016 1,230     Problem List Items Addressed This Visit      High   HIV disease (HCC)    His infection remains under very good long-term control.  I talked to him about the importance of limiting the number of partners he has and choosing  partners very carefully in the future.  He will continue Triumeq and Prezcobix and follow-up after lab work in 1 year.  He received his influenza vaccine yesterday.      Relevant Orders   CBC   T-helper cell (CD4)- ([facility] clinic only)   Comprehensive metabolic panel   Lipid panel   RPR   HIV-1 RNA quant-no reflex-bld     Unprioritized   Major depressive disorder, recurrent episode, severe, with psychosis (HCC)    His recurrent, major depression and bipolar disorder he is still active but seems mild and stable.  He is having regular visits at [facility].  He is aware that he can also see our counselors here at any time.      Chlamydial proctitis    He has had multiple sexually transmitted infections and is at high risk for HPV.  We will schedule him for high resolution anoscopy.       Other Visit Diagnoses    Rectal bleeding    -  Primary   Relevant Orders   Ambulatory referral to General Surgery        Cliffton Asters, MD [facility] for Infectious Disease [facility] [phone] pager   [phone] cell 05/03/2018, 12:03 PM

## 2018-05-03 NOTE — Assessment & Plan Note (Signed)
He has had multiple sexually transmitted infections and is at high risk for HPV.  We will schedule him for high resolution anoscopy.

## 2018-05-03 NOTE — Assessment & Plan Note (Signed)
His infection remains under very good long-term control.  I talked to him about the importance of limiting the number of partners he has and choosing partners very carefully in the future.  He will continue Triumeq and Prezcobix and follow-up after lab work in 1 year.  He received his influenza vaccine yesterday.

## 2018-05-03 NOTE — Assessment & Plan Note (Signed)
His recurrent, major depression and bipolar disorder he is still active but seems mild and stable.  He is having regular visits at [facility].  He is aware that he can also see our counselors here at any time.

## 2018-05-06 ENCOUNTER — Encounter: Payer: Self-pay | Admitting: Gastroenterology

## 2018-06-07 ENCOUNTER — Encounter: Payer: Self-pay | Admitting: Gastroenterology

## 2018-06-07 ENCOUNTER — Ambulatory Visit: Payer: Medicaid Other | Admitting: Gastroenterology

## 2018-06-07 VITALS — BP 120/80 | HR 86 | Ht 66.0 in | Wt 152.0 lb

## 2018-06-07 DIAGNOSIS — K625 Hemorrhage of anus and rectum: Secondary | ICD-10-CM | POA: Diagnosis not present

## 2018-06-07 DIAGNOSIS — K5909 Other constipation: Secondary | ICD-10-CM

## 2018-06-07 NOTE — Patient Instructions (Signed)
If you are age 27 or older, your body mass index should be between 23-30. Your Body mass index is 24.53 kg/m. If this is out of the aforementioned range listed, please consider follow up with your Primary Care Provider.  If you are age 27 or younger, your body mass index should be between 19-25. Your Body mass index is 24.53 kg/m. If this is out of the aformentioned range listed, please consider follow up with your Primary Care Provider.   Follow up as needed.  It was a pleasure to see you today!  Dr. Myrtie Neitheranis

## 2018-06-07 NOTE — Progress Notes (Signed)
[facility] Consult Note:  History: Jeffery [name] 06/07/2018  Referring physician: Cliffton Asters, MD  Reason for consult/chief complaint: Hemorrhoids (Was bleeding but not now,  No rectal pain ) and Constipation (trouble with hemorrhoids with constipation but it not currenlty having constipation )   Subjective  HPI:  This is a very pleasant 27 year old man referred for reported external hemorrhoids.  About 6 months ago he was having constipation and presented to the ED with pain and rectal bleeding.  The constipation resolved after that, and he has not had any further bleeding like that.  He will occasionally have some spotting of blood on the paper, no frank rectal bleeding.  He denies abdominal pain, altered appetite or weight loss.  He is not sure he has hemorrhoids, but feels something unusual in the perianal area.  ROS:  Review of Systems He denies chest pain dyspnea or dysuria  Past Medical History: Past Medical History:  Diagnosis Date  . Anxiety   . Chlamydia   . Depression   . Drug induced constipation   . Hemorrhoids   . HIV infection (HCC)   . Hyperlipidemia   . Hypertension    Pt went off meds on his own; has been monitored without any problems  . Male-to-male transgender person    former history   . Nicotine dependence   . Syphilis 2013 and 2015     Past Surgical History: History reviewed. No pertinent surgical history.   Family History: Family History  Problem Relation Age of Onset  . Hypertension Mother     Social History: Social History   Socioeconomic History  . Marital status: Single    Spouse name: Not on file  . Number of children: Not on file  . Years of education: Not on file  . Highest education level: Not on file  Occupational History  . Not on file  Social Needs  . Financial resource strain: Not on file  . Food insecurity:    Worry: Not on file    Inability: Not on file  . Transportation needs:      Medical: Not on file    Non-medical: Not on file  Tobacco Use  . Smoking status: Light Tobacco Smoker    Packs/day: 0.25    Types: Cigarettes  . Smokeless tobacco: Never Used  . Tobacco comment: 5 cigarettes a day  Substance and Sexual Activity  . Alcohol use: Yes    Alcohol/week: 0.0 standard drinks    Comment: occasional  . Drug use: Yes    Frequency: 3.0 times per week    Types: Marijuana  . Sexual activity: Not Currently    Partners: Male    Comment: given condoms  Lifestyle  . Physical activity:    Days per week: Not on file    Minutes per session: Not on file  . Stress: Not on file  Relationships  . Social connections:    Talks on phone: Not on file    Gets together: Not on file    Attends religious service: Not on file    Active member of club or organization: Not on file    Attends meetings of clubs or organizations: Not on file    Relationship status: Not on file  Other Topics Concern  . Not on file  Social History Narrative  . Not on file   Man who has sex with men, including receptive anal intercourse.  Allergies: Allergies  Allergen Reactions  . Bee Venom Swelling  Large local reactions  . Lisinopril Swelling    Facial and lip swelling    Outpatient Meds: Current Outpatient Medications  Medication Sig Dispense Refill  . amLODipine (NORVASC) 5 MG tablet TAKE 1 TABLET BY MOUTH ONCE DAILY 30 tablet 3  . doxepin (SINEQUAN) 10 MG capsule Take 10 mg by mouth at bedtime.    Marland Kitchen. FLUoxetine (PROZAC) 20 MG capsule Take 1 capsule (20 mg total) by mouth daily. For depression 30 capsule 0  . OLANZapine (ZYPREXA) 15 MG tablet Take 1 tablet (15 mg total) by mouth at bedtime. For mood control 30 tablet 0  . prazosin (MINIPRESS) 2 MG capsule Take 2 mg by mouth at bedtime.    Marland Kitchen. PREZCOBIX 800-150 MG tablet TAKE 1 TABLET BY MOUTH EVERY DAY WITH FOOD. SWALLOW WHOLE, DO NOT CRUSH, BREAK, OR CHEW. 30 tablet 5  . TRIUMEQ 600-50-300 MG tablet TAKE 1 TABLET BY MOUTH ONCE  DAILY. 30 tablet 5   No current facility-administered medications for this visit.       ___________________________________________________________________ Objective   Exam:  BP 120/80   Pulse 86   Ht 5\' 6"  (1.676 m)   Wt 152 lb (68.9 kg)   SpO2 98%   BMI 24.53 kg/m    General: this is a(n) well-appearing man  Eyes: sclera anicteric, no redness  ENT: oral mucosa moist without lesions, no cervical or supraclavicular lymphadenopathy  CV: RRR without murmur, S1/S2, no JVD, no peripheral edema  Resp: clear to auscultation bilaterally, normal RR and effort noted  GI: soft, no tenderness, with active bowel sounds. No guarding or palpable organomegaly noted.  Skin; warm and dry, no rash or jaundice noted  Neuro: awake, alert and oriented x 3. Normal gross motor function and fluent speech Rectal: Redundant perianal skin fold, no external hemorrhoids.  No fissure or tenderness on exam, soft stool in rectal vault, no palpable internal lesions.  Labs:  CBC Latest Ref Rng & Units 04/19/2018 11/30/2017 09/21/2016  WBC 3.8 - 10.8 Thousand/uL 14.3(H) 14.7(H) 12.2(H)  Hemoglobin 13.2 - 17.1 g/dL 16.114.2 09.613.2 04.515.2  Hematocrit 38.5 - 50.0 % 41.3 39.7 45.7  Platelets 140 - 400 Thousand/uL 322 366 301   CMP Latest Ref Rng & Units 04/19/2018 11/30/2017 09/21/2016  Glucose 65 - 99 mg/dL 98 89 81  BUN 7 - 25 mg/dL 8 6 12   Creatinine 0.60 - 1.35 mg/dL 4.091.07 8.110.89 9.141.17  Sodium 135 - 146 mmol/L 137 137 137  Potassium 3.5 - 5.3 mmol/L 3.8 3.6 4.0  Chloride 98 - 110 mmol/L 105 103 103  CO2 20 - 32 mmol/L 24 25 23   Calcium 8.6 - 10.3 mg/dL 9.2 9.1 9.7  Total Protein 6.1 - 8.1 g/dL 7.4 8.1 7.5  Total Bilirubin 0.2 - 1.2 mg/dL 0.4 0.6 0.8  Alkaline Phos 38 - 126 U/L - 72 76  AST 10 - 40 U/L 14 14(L) 17  ALT 9 - 46 U/L 9 14(L) 12   + for Chlamydia  Undetected HIV viral load Oct 19  Radiologic Studies: CTAP 11/30/17 during ED visit  IMPRESSION: 1. Moderate circumferential rectal wall  thickening with adjacent inflammation, likely infectious or inflammatory etiology. No evidence of abscess, pneumoperitoneum or bowel obstruction. 2. No other significant abnormalities.     Electronically Signed   By: Harmon PierJeffrey  Hu M.D.   On: 12/01/2017 13:21   Assessment: Encounter Diagnoses  Name Primary?  . Chronic constipation Yes  . Rectal bleeding     The frank rectal bleeding occurred during  a period of constipation that has since resolved.  His bowel habits of normalized, he just has occasional blood on the paper that is benign anal bleeding, perhaps from being a little over aggressive cleaning after toileting.  I do not think he has proctitis or neoplasia.  He does not currently have a fissure.  I wonder if the episode in May could have been a fissure has since healed.  Plan:  No further testing needed at this point.  As needed use of MiraLAX for constipation and see me as needed.  Thank you for the courtesy of this consult.  Please call me with any questions or concerns.  Charlie Pitter III  CC: Cliffton Asters, MD

## 2018-06-21 ENCOUNTER — Other Ambulatory Visit: Payer: Self-pay | Admitting: Internal Medicine

## 2018-06-21 DIAGNOSIS — B20 Human immunodeficiency virus [HIV] disease: Secondary | ICD-10-CM

## 2018-08-29 ENCOUNTER — Emergency Department (HOSPITAL_COMMUNITY): Payer: Medicare Other

## 2018-08-29 ENCOUNTER — Encounter (HOSPITAL_COMMUNITY): Payer: Self-pay

## 2018-08-29 ENCOUNTER — Emergency Department (HOSPITAL_COMMUNITY)
Admission: EM | Admit: 2018-08-29 | Discharge: 2018-08-29 | Disposition: A | Discharge status: Home / Self Care | Payer: Medicare Other | Attending: Emergency Medicine | Admitting: Emergency Medicine

## 2018-08-29 DIAGNOSIS — S199XXA Unspecified injury of neck, initial encounter: Secondary | ICD-10-CM | POA: Diagnosis not present

## 2018-08-29 DIAGNOSIS — Y998 Other external cause status: Secondary | ICD-10-CM | POA: Insufficient documentation

## 2018-08-29 DIAGNOSIS — Y929 Unspecified place or not applicable: Secondary | ICD-10-CM | POA: Insufficient documentation

## 2018-08-29 DIAGNOSIS — S29019A Strain of muscle and tendon of unspecified wall of thorax, initial encounter: Secondary | ICD-10-CM | POA: Insufficient documentation

## 2018-08-29 DIAGNOSIS — S299XXA Unspecified injury of thorax, initial encounter: Secondary | ICD-10-CM | POA: Diagnosis not present

## 2018-08-29 DIAGNOSIS — S161XXA Strain of muscle, fascia and tendon at neck level, initial encounter: Secondary | ICD-10-CM | POA: Diagnosis not present

## 2018-08-29 DIAGNOSIS — Y999 Unspecified external cause status: Secondary | ICD-10-CM | POA: Diagnosis not present

## 2018-08-29 DIAGNOSIS — I10 Essential (primary) hypertension: Secondary | ICD-10-CM | POA: Insufficient documentation

## 2018-08-29 DIAGNOSIS — Z79899 Other long term (current) drug therapy: Secondary | ICD-10-CM | POA: Diagnosis not present

## 2018-08-29 DIAGNOSIS — S0083XA Contusion of other part of head, initial encounter: Secondary | ICD-10-CM | POA: Insufficient documentation

## 2018-08-29 DIAGNOSIS — M546 Pain in thoracic spine: Secondary | ICD-10-CM | POA: Diagnosis not present

## 2018-08-29 DIAGNOSIS — Y939 Activity, unspecified: Secondary | ICD-10-CM | POA: Diagnosis not present

## 2018-08-29 DIAGNOSIS — S29012A Strain of muscle and tendon of back wall of thorax, initial encounter: Secondary | ICD-10-CM | POA: Diagnosis not present

## 2018-08-29 DIAGNOSIS — M542 Cervicalgia: Secondary | ICD-10-CM | POA: Diagnosis not present

## 2018-08-29 MED ORDER — CYCLOBENZAPRINE HCL 10 MG PO TABS: 10.0000 mg | ORAL_TABLET | Freq: Three times a day (TID) | ORAL | 0 refills | Status: DC | PRN
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
## 2018-08-29 NOTE — ED Notes (Signed)
See EDP assessment. Pt verbalizes understanding of d/c instructions. Prescriptions reviewed with patient. Pt ambulatory at d/c with all belongings.  
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
## 2018-08-29 NOTE — Discharge Instructions (Signed)
Ibuprofen 600 mg every 6 hours as needed for pain.  Flexeril as prescribed as needed for pain not relieved with ibuprofen. ° °Follow-up with your primary doctor if symptoms or not improving in the next week. °

## 2018-09-01 ENCOUNTER — Emergency Department (HOSPITAL_COMMUNITY)
Admission: EM | Admit: 2018-09-01 | Discharge: 2018-09-01 | Disposition: A | Discharge status: Home / Self Care | Payer: Medicare Other | Attending: Emergency Medicine | Admitting: Emergency Medicine

## 2018-09-01 ENCOUNTER — Telehealth: Payer: Self-pay | Admitting: Behavioral Health

## 2018-09-01 ENCOUNTER — Other Ambulatory Visit: Payer: Self-pay

## 2018-09-01 ENCOUNTER — Emergency Department (HOSPITAL_COMMUNITY): Payer: Medicare Other

## 2018-09-01 ENCOUNTER — Ambulatory Visit (HOSPITAL_COMMUNITY): Admission: EM | Admit: 2018-09-01 | Discharge: 2018-09-01 | Payer: Medicare Other | Source: Home / Self Care

## 2018-09-01 ENCOUNTER — Encounter (HOSPITAL_COMMUNITY): Payer: Self-pay

## 2018-09-01 DIAGNOSIS — F17210 Nicotine dependence, cigarettes, uncomplicated: Secondary | ICD-10-CM | POA: Diagnosis not present

## 2018-09-01 DIAGNOSIS — R202 Paresthesia of skin: Secondary | ICD-10-CM | POA: Diagnosis not present

## 2018-09-01 DIAGNOSIS — Y939 Activity, unspecified: Secondary | ICD-10-CM | POA: Diagnosis not present

## 2018-09-01 DIAGNOSIS — S161XXA Strain of muscle, fascia and tendon at neck level, initial encounter: Secondary | ICD-10-CM | POA: Diagnosis not present

## 2018-09-01 DIAGNOSIS — B20 Human immunodeficiency virus [HIV] disease: Secondary | ICD-10-CM | POA: Insufficient documentation

## 2018-09-01 DIAGNOSIS — F4310 Post-traumatic stress disorder, unspecified: Secondary | ICD-10-CM | POA: Diagnosis not present

## 2018-09-01 DIAGNOSIS — M546 Pain in thoracic spine: Secondary | ICD-10-CM | POA: Diagnosis not present

## 2018-09-01 DIAGNOSIS — F419 Anxiety disorder, unspecified: Secondary | ICD-10-CM | POA: Diagnosis not present

## 2018-09-01 DIAGNOSIS — Y929 Unspecified place or not applicable: Secondary | ICD-10-CM | POA: Insufficient documentation

## 2018-09-01 DIAGNOSIS — F1290 Cannabis use, unspecified, uncomplicated: Secondary | ICD-10-CM | POA: Insufficient documentation

## 2018-09-01 DIAGNOSIS — I10 Essential (primary) hypertension: Secondary | ICD-10-CM | POA: Diagnosis not present

## 2018-09-01 DIAGNOSIS — S199XXA Unspecified injury of neck, initial encounter: Secondary | ICD-10-CM | POA: Diagnosis not present

## 2018-09-01 DIAGNOSIS — F251 Schizoaffective disorder, depressive type: Secondary | ICD-10-CM | POA: Diagnosis not present

## 2018-09-01 DIAGNOSIS — M542 Cervicalgia: Secondary | ICD-10-CM | POA: Diagnosis present

## 2018-09-01 DIAGNOSIS — S0990XA Unspecified injury of head, initial encounter: Secondary | ICD-10-CM | POA: Diagnosis not present

## 2018-09-01 DIAGNOSIS — R51 Headache: Secondary | ICD-10-CM | POA: Insufficient documentation

## 2018-09-01 DIAGNOSIS — Z79899 Other long term (current) drug therapy: Secondary | ICD-10-CM | POA: Insufficient documentation

## 2018-09-01 DIAGNOSIS — Y999 Unspecified external cause status: Secondary | ICD-10-CM | POA: Insufficient documentation

## 2018-09-01 MED ORDER — METHOCARBAMOL 500 MG PO TABS: 500.0000 mg | ORAL_TABLET | Freq: Two times a day (BID) | ORAL | 0 refills | Status: DC
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
## 2018-09-01 NOTE — ED Notes (Signed)
Declined W/C at D/C and was escorted to lobby by RN. 
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
## 2018-09-01 NOTE — Discharge Instructions (Signed)
Stop taking Flexeril and begin taking Robaxin as prescribed.  Do not drive or operate machinery while taking this medication.  Continue alternating ibuprofen and Tylenol as prescribed.  Use ice and heat alternating 20 minutes on, 20 minutes off.  Attempt the stretches we discussed as tolerated.  Please return to the emergency department he develop any new or worsening symptoms.  Please follow-up with your doctor if your symptoms are not improving over the next week.

## 2018-09-16 ENCOUNTER — Encounter: Payer: Self-pay | Admitting: Family Medicine

## 2018-09-16 ENCOUNTER — Other Ambulatory Visit: Payer: Self-pay

## 2018-09-16 ENCOUNTER — Ambulatory Visit (INDEPENDENT_AMBULATORY_CARE_PROVIDER_SITE_OTHER): Payer: Medicare Other | Admitting: Family Medicine

## 2018-09-16 ENCOUNTER — Ambulatory Visit: Payer: Self-pay | Admitting: Family Medicine

## 2018-09-16 VITALS — BP 126/80 | HR 88 | Temp 97.6°F | Wt 155.0 lb

## 2018-09-16 DIAGNOSIS — E785 Hyperlipidemia, unspecified: Secondary | ICD-10-CM

## 2018-09-16 DIAGNOSIS — I10 Essential (primary) hypertension: Secondary | ICD-10-CM

## 2018-09-16 DIAGNOSIS — B20 Human immunodeficiency virus [HIV] disease: Secondary | ICD-10-CM

## 2018-09-16 DIAGNOSIS — Z79899 Other long term (current) drug therapy: Secondary | ICD-10-CM

## 2018-09-16 DIAGNOSIS — S161XXA Strain of muscle, fascia and tendon at neck level, initial encounter: Secondary | ICD-10-CM | POA: Diagnosis not present

## 2018-09-16 DIAGNOSIS — Z9189 Other specified personal risk factors, not elsewhere classified: Secondary | ICD-10-CM

## 2018-09-16 DIAGNOSIS — Z0189 Encounter for other specified special examinations: Secondary | ICD-10-CM | POA: Diagnosis not present

## 2018-09-16 DIAGNOSIS — Z0000 Encounter for general adult medical examination without abnormal findings: Secondary | ICD-10-CM

## 2018-09-16 DIAGNOSIS — F332 Major depressive disorder, recurrent severe without psychotic features: Secondary | ICD-10-CM

## 2018-09-16 DIAGNOSIS — F419 Anxiety disorder, unspecified: Secondary | ICD-10-CM

## 2018-09-16 LAB — POCT GLYCOSYLATED HEMOGLOBIN (HGB A1C): Hemoglobin A1C: 5.4 % (ref 4.0–5.6)

## 2018-09-16 MED ORDER — NAPROXEN 500 MG PO TABS: 500.0000 mg | ORAL_TABLET | Freq: Two times a day (BID) | ORAL | 0 refills | Status: DC

## 2018-09-16 MED ORDER — ATORVASTATIN CALCIUM 40 MG PO TABS: 40.0000 mg | ORAL_TABLET | Freq: Every day | ORAL | 3 refills | Status: DC

## 2018-09-16 MED ORDER — NAPROXEN 500 MG PO TABS: 500.0000 mg | ORAL_TABLET | Freq: Two times a day (BID) | ORAL | 0 refills | Status: DC | PRN

## 2018-09-16 NOTE — Assessment & Plan Note (Signed)
No current s/s of psychosis. Did not assess depression as patient's mood is stable and follows at [facility].
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
## 2018-09-16 NOTE — Assessment & Plan Note (Signed)
Follows w/ ID. CD4 count > 1000. Last viral load undetectable.

## 2018-09-16 NOTE — Assessment & Plan Note (Signed)
Followed by [facility]. Will get medication list from them.

## 2018-09-16 NOTE — Patient Instructions (Addendum)
It was a pleasure to see you today! Thank you for choosing [facility] for your primary care. Jeffery [name] was seen to establish care.   For your neck pain, you are starting naproxen.   For your high cholesterol, we will start a statin medication. I will reach out to Dr. Wess Botts to ensure that this is ok with your HIV meds.   We are checking you for potential risk of DMT2 today. We can call you with the results.    Best,  Thomes Dinning, MD, MS FAMILY MEDICINE RESIDENT - PGY2 09/16/2018 4:05 PM

## 2018-09-16 NOTE — Assessment & Plan Note (Addendum)
LDL greater than 200x2. Possibly due to metabolic syndrome effects from olanzapine. Other risk factors for cardiac disease include HTN. Will reach out to ID to see if atorvastatin interacts with HIV meds. No interaction noted on initial screen w/in Epic.  - atorvastin 40 mg qd
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
## 2018-09-16 NOTE — Assessment & Plan Note (Signed)
Elevated LDL > 200. Will screen for DM. Also risk factor HTN.

## 2018-09-16 NOTE — Assessment & Plan Note (Signed)
BP at goal. Cont to monitor. Risk stratify w/ A1C screen.

## 2018-11-23 DIAGNOSIS — F4310 Post-traumatic stress disorder, unspecified: Secondary | ICD-10-CM | POA: Diagnosis not present

## 2018-11-23 DIAGNOSIS — F251 Schizoaffective disorder, depressive type: Secondary | ICD-10-CM | POA: Diagnosis not present

## 2018-11-23 DIAGNOSIS — F419 Anxiety disorder, unspecified: Secondary | ICD-10-CM | POA: Diagnosis not present

## 2019-01-09 ENCOUNTER — Other Ambulatory Visit: Payer: Self-pay | Admitting: Internal Medicine

## 2019-01-09 DIAGNOSIS — B20 Human immunodeficiency virus [HIV] disease: Secondary | ICD-10-CM

## 2019-03-03 ENCOUNTER — Ambulatory Visit (HOSPITAL_COMMUNITY)
Admission: EM | Admit: 2019-03-03 | Discharge: 2019-03-03 | Disposition: A | Discharge status: Home / Self Care | Payer: Medicare Other | Attending: Emergency Medicine | Admitting: Emergency Medicine

## 2019-03-03 ENCOUNTER — Encounter (HOSPITAL_COMMUNITY): Payer: Self-pay | Admitting: Emergency Medicine

## 2019-03-03 ENCOUNTER — Other Ambulatory Visit: Payer: Self-pay

## 2019-03-03 DIAGNOSIS — R51 Headache: Secondary | ICD-10-CM | POA: Diagnosis not present

## 2019-03-03 DIAGNOSIS — R519 Headache, unspecified: Secondary | ICD-10-CM

## 2019-03-03 DIAGNOSIS — I10 Essential (primary) hypertension: Secondary | ICD-10-CM | POA: Diagnosis not present

## 2019-03-03 MED ORDER — IPRATROPIUM BROMIDE 0.03 % NA SOLN: 2.0000 | Freq: Two times a day (BID) | NASAL | 0 refills | Status: DC

## 2019-03-03 MED ORDER — KETOROLAC TROMETHAMINE 60 MG/2ML IM SOLN
60.0000 mg | Freq: Once | INTRAMUSCULAR | Status: AC
  Administered 2019-03-03: 60 mg via INTRAMUSCULAR

## 2019-03-03 MED ORDER — CETIRIZINE HCL 10 MG PO TABS: 10.0000 mg | ORAL_TABLET | Freq: Every day | ORAL | 0 refills | Status: DC

## 2019-03-03 MED ORDER — KETOROLAC TROMETHAMINE 60 MG/2ML IM SOLN
INTRAMUSCULAR | Status: AC
  Filled 2019-03-03: qty 2

## 2019-03-03 NOTE — Discharge Instructions (Signed)
°  You may take 500mg  acetaminophen every 4-6 hours or in combination with ibuprofen 400-600mg  every 6-8 hours as needed for pain and inflammation.   Be sure to drink at least eight 8oz glasses of water to stay well hydrated and get at least 8 hours of sleep at night, preferably more while sick.   Please follow up with family medicine for recurrent headaches.  Call 911 or go to the hospital if new or worsening symptoms develop.

## 2019-03-03 NOTE — ED Provider Notes (Signed)
[facility]    CSN: [id number] Arrival date & time: 03/03/19  1353      History   Chief Complaint Chief Complaint  Patient presents with  . Headache    HPI Jeffery [name] is a 28 y.o. male.   HPI Jeffery [name] is a 28 y.o. male presenting to UC with c/o frontal headache that started this morning, aching and sore. Hx of similar headaches over the last few weeks. Pain is 7/10. He has not tried anything for his pain today.  Typically he "sleeps them off."  Denies recent illness including no cough, congestion, sore throat or ear pain. No dizziness. No recent medication changes.    Past Medical History:  Diagnosis Date  . Anxiety   . Chlamydia   . Depression   . Drug induced constipation   . Hemorrhoids   . HIV infection (HCC)   . Hyperlipidemia   . Hypertension    Pt went off meds on his own; has been monitored without any problems  . Male-to-male transgender person    former history   . Nicotine dependence   . Syphilis 2013 and 2015    Patient Active Problem List   Diagnosis Date Noted  . Cervical strain 09/16/2018  . Assessment of effects of psychotropic drug in patient at risk for metabolic syndrome 09/16/2018  . Hypertension 10/05/2016  . MDD (major depressive disorder), recurrent severe, without psychosis (HCC) 02/25/2016  . Cannabis use disorder, severe, dependence (HCC) 02/25/2016  . Bipolar disorder (HCC) 06/21/2014  . Anxiety 06/21/2014  . Cigarette smoker 06/21/2014  . HIV disease (HCC) 06/20/2014  . Dyslipidemia 06/20/2014    History reviewed. No pertinent surgical history.     Home Medications    Prior to Admission medications   Medication Sig Start Date End Date Taking? Authorizing Provider  acetaminophen (TYLENOL) 500 MG tablet Take 500 mg by mouth every 6 (six) hours as needed for mild pain.    [provider]  amLODipine (NORVASC) 5 MG tablet TAKE 1 TABLET BY MOUTH ONCE DAILY Patient taking differently: Take 5 mg  by mouth daily.  04/25/18   Cliffton Asters, MD  atorvastatin (LIPITOR) 40 MG tablet Take 1 tablet (40 mg total) by mouth daily. 09/16/18   Garnette Gunner, MD  cetirizine (ZYRTEC) 10 MG tablet Take 1 tablet (10 mg total) by mouth daily. 03/03/19   Lurene Shadow, PA-C  doxepin (SINEQUAN) 10 MG capsule Take 10 mg by mouth at bedtime.    [provider]  FLUoxetine (PROZAC) 20 MG capsule Take 1 capsule (20 mg total) by mouth daily. For depression 03/02/16   Armandina Stammer I, NP  ipratropium (ATROVENT) 0.03 % nasal spray Place 2 sprays into both nostrils every 12 (twelve) hours. 03/03/19   Lurene Shadow, PA-C  methocarbamol (ROBAXIN) 500 MG tablet Take 1 tablet (500 mg total) by mouth 2 (two) times daily. 09/01/18   Law, Waylan Boga, PA-C  naproxen (NAPROSYN) 500 MG tablet Take 1 tablet (500 mg total) by mouth 2 (two) times daily as needed for mild pain or moderate pain (neck). 09/16/18   Garnette Gunner, MD  OLANZapine (ZYPREXA) 15 MG tablet Take 1 tablet (15 mg total) by mouth at bedtime. For mood control 03/02/16   Armandina Stammer I, NP  OLANZapine (ZYPREXA) 2.5 MG tablet Take 2.5 mg by mouth 2 (two) times daily. Morning and evening (evening dose with the 15mg  tablet=17.5mg )    [provider]  prazosin (MINIPRESS) 2 MG  capsule Take 2 mg by mouth at bedtime.    [provider]  PREZCOBIX 800-150 MG tablet TAKE 1 TABLET BY MOUTH EVERY DAY WITH FOOD, SWALLOW WHOLE. DO NOT CRUSH, BREAK, OR CHEW 01/09/19   Cliffton Astersampbell, John, MD  TRIUMEQ 600-50-300 MG tablet TAKE 1 TABLET BY MOUTH ONCE DAILY 01/09/19   Cliffton Astersampbell, John, MD    Family History Family History  Problem Relation Age of Onset  . Hypertension Father   . Hypertension Maternal Grandmother     Social History Social History   Tobacco Use  . Smoking status: Light Tobacco Smoker    Packs/day: 0.25    Types: Cigarettes  . Smokeless tobacco: Never Used  . Tobacco comment: 5 cigarettes a day  Substance Use Topics  . Alcohol use:  Yes    Alcohol/week: 0.0 standard drinks    Comment: occasional  . Drug use: Yes    Frequency: 3.0 times per week    Types: Marijuana     Allergies   Bee venom and Lisinopril   Review of Systems Review of Systems  Constitutional: Negative for chills and fever.  HENT: Negative for congestion, ear pain, facial swelling, sinus pressure, sinus pain and sore throat.   Eyes: Negative for photophobia and visual disturbance.  Gastrointestinal: Negative for diarrhea, nausea and vomiting.  Musculoskeletal: Negative for neck pain and neck stiffness.  Neurological: Positive for headaches. Negative for dizziness, weakness, light-headedness and numbness.     Physical Exam Triage Vital Signs ED Triage Vitals  Enc Vitals Group     BP 03/03/19 1403 (!) 141/96     Pulse Rate 03/03/19 1403 74     Resp 03/03/19 1403 17     Temp 03/03/19 1403 98.3 F (36.8 C)     Temp Source 03/03/19 1403 Oral     SpO2 03/03/19 1403 98 %     Weight --      Height --      Head Circumference --      Peak Flow --      Pain Score 03/03/19 1407 7     Pain Loc --      Pain Edu? --      Excl. in GC? --    No data found.  Updated Vital Signs BP (!) 141/96 (BP Location: Right Arm)   Pulse 74   Temp 98.3 F (36.8 C) (Oral)   Resp 17   SpO2 98%   Visual Acuity Right Eye Distance:   Left Eye Distance:   Bilateral Distance:    Right Eye Near:   Left Eye Near:    Bilateral Near:     Physical Exam Vitals signs and nursing note reviewed.  Constitutional:      Appearance: He is well-developed.  HENT:     Head: Normocephalic and atraumatic.     Right Ear: Tympanic membrane normal.     Left Ear: Tympanic membrane normal.     Nose: Nose normal.     Right Sinus: No maxillary sinus tenderness or frontal sinus tenderness.     Left Sinus: No maxillary sinus tenderness or frontal sinus tenderness.     Mouth/Throat:     Lips: Pink.     Mouth: Mucous membranes are moist.     Pharynx: Oropharynx is clear.  Uvula midline.  Neck:     Musculoskeletal: Normal range of motion and neck supple. No neck rigidity.     Comments: No nuchal rigidity or meningeal signs. Cardiovascular:     Rate  and Rhythm: Normal rate.  Pulmonary:     Effort: Pulmonary effort is normal.     Breath sounds: Normal breath sounds.  Musculoskeletal: Normal range of motion.  Skin:    General: Skin is warm and dry.  Neurological:     Mental Status: He is alert and oriented to person, place, and time.     GCS: GCS eye subscore is 4. GCS verbal subscore is 5. GCS motor subscore is 6.  Psychiatric:        Behavior: Behavior normal.      UC Treatments / Results  Labs (all labs ordered are listed, but only abnormal results are displayed) Labs Reviewed - No data to display  EKG   Radiology No results found.  Procedures Procedures (including critical care time)  Medications Ordered in UC Medications  ketorolac (TORADOL) injection 60 mg (60 mg Intramuscular Given 03/03/19 1426)  ketorolac (TORADOL) 60 MG/2ML injection (has no administration in time range)    Initial Impression / Assessment and Plan / UC Course  I have reviewed the triage vital signs and the nursing notes.  Pertinent labs & imaging results that were available during my care of the patient were reviewed by me and considered in my medical decision making (see chart for details).     Pt c/o recurrent frontal headaches. No red flag symptoms Toradol given in UC AVS provided Discussed symptoms that warrant emergent care in the ED. Work note provided today.   Final Clinical Impressions(s) / UC Diagnoses   Final diagnoses:  Frontal headache     Discharge Instructions      You may take 500mg  acetaminophen every 4-6 hours or in combination with ibuprofen 400-600mg  every 6-8 hours as needed for pain and inflammation.   Be sure to drink at least eight 8oz glasses of water to stay well hydrated and get at least 8 hours of sleep at night,  preferably more while sick.   Please follow up with family medicine for recurrent headaches.  Call 911 or go to the hospital if new or worsening symptoms develop.     ED Prescriptions    Medication Sig Dispense Auth. Provider   cetirizine (ZYRTEC) 10 MG tablet Take 1 tablet (10 mg total) by mouth daily. 30 tablet Gerarda Fraction, Brilee [place], PA-C   ipratropium (ATROVENT) 0.03 % nasal spray Place 2 sprays into both nostrils every 12 (twelve) hours. 30 mL Noe Gens, PA-C     Controlled Substance Prescriptions [place] Controlled Substance Registry consulted? Not Applicable   Tyrell Antonio 03/03/19 1610

## 2019-03-03 NOTE — ED Triage Notes (Signed)
Pt here with frontal HA with hx of same

## 2019-03-23 DIAGNOSIS — F4310 Post-traumatic stress disorder, unspecified: Secondary | ICD-10-CM | POA: Diagnosis not present

## 2019-03-23 DIAGNOSIS — F17210 Nicotine dependence, cigarettes, uncomplicated: Secondary | ICD-10-CM | POA: Diagnosis not present

## 2019-03-23 DIAGNOSIS — F419 Anxiety disorder, unspecified: Secondary | ICD-10-CM | POA: Diagnosis not present

## 2019-03-23 DIAGNOSIS — F251 Schizoaffective disorder, depressive type: Secondary | ICD-10-CM | POA: Diagnosis not present

## 2019-05-04 ENCOUNTER — Other Ambulatory Visit: Payer: Self-pay

## 2019-05-04 ENCOUNTER — Other Ambulatory Visit: Payer: Medicare Other

## 2019-05-04 DIAGNOSIS — B20 Human immunodeficiency virus [HIV] disease: Secondary | ICD-10-CM

## 2019-05-05 LAB — T-HELPER CELL (CD4) - (RCID CLINIC ONLY)
CD4 % Helper T Cell: 33 % (ref 33–65)
CD4 T Cell Abs: 1340 /uL (ref 400–1790)

## 2019-05-10 LAB — CBC
HCT: 42 % (ref 38.5–50.0)
Hemoglobin: 14.3 g/dL (ref 13.2–17.1)
MCH: 32.6 pg (ref 27.0–33.0)
MCHC: 34 g/dL (ref 32.0–36.0)
MCV: 95.9 fL (ref 80.0–100.0)
MPV: 10.9 fL (ref 7.5–12.5)
Platelets: 309 10*3/uL (ref 140–400)
RBC: 4.38 10*6/uL (ref 4.20–5.80)
RDW: 13.8 % (ref 11.0–15.0)
WBC: 11.9 10*3/uL — ABNORMAL HIGH (ref 3.8–10.8)

## 2019-05-10 LAB — COMPREHENSIVE METABOLIC PANEL
AG Ratio: 1.3 (calc) (ref 1.0–2.5)
ALT: 11 U/L (ref 9–46)
AST: 16 U/L (ref 10–40)
Albumin: 4 g/dL (ref 3.6–5.1)
Alkaline phosphatase (APISO): 81 U/L (ref 36–130)
BUN: 7 mg/dL (ref 7–25)
CO2: 26 mmol/L (ref 20–32)
Calcium: 9.4 mg/dL (ref 8.6–10.3)
Chloride: 107 mmol/L (ref 98–110)
Creat: 1.01 mg/dL (ref 0.60–1.35)
Globulin: 3 g/dL (calc) (ref 1.9–3.7)
Glucose, Bld: 78 mg/dL (ref 65–99)
Potassium: 4 mmol/L (ref 3.5–5.3)
Sodium: 140 mmol/L (ref 135–146)
Total Bilirubin: 0.6 mg/dL (ref 0.2–1.2)
Total Protein: 7 g/dL (ref 6.1–8.1)

## 2019-05-10 LAB — LIPID PANEL
Cholesterol: 281 mg/dL — ABNORMAL HIGH (ref <200)
HDL: 39 mg/dL — ABNORMAL LOW (ref >=40)
LDL Cholesterol (Calc): 210 mg/dL (calc) — ABNORMAL HIGH
Non-HDL Cholesterol (Calc): 242 mg/dL (calc) — ABNORMAL HIGH (ref <130)
Total CHOL/HDL Ratio: 7.2 (calc) — ABNORMAL HIGH (ref <5.0)
Triglycerides: 152 mg/dL — ABNORMAL HIGH (ref <150)

## 2019-05-10 LAB — HIV-1 RNA QUANT-NO REFLEX-BLD
HIV 1 RNA Quant: <20 copies/mL
HIV-1 RNA Quant, Log: <1.3 Log copies/mL

## 2019-05-10 LAB — FLUORESCENT TREPONEMAL AB(FTA)-IGG-BLD: Fluorescent Treponemal ABS: REACTIVE — AB

## 2019-05-10 LAB — RPR TITER: RPR Titer: 1:4 {titer} — ABNORMAL HIGH

## 2019-05-10 LAB — RPR: RPR Ser Ql: REACTIVE — AB

## 2019-05-19 ENCOUNTER — Telehealth: Payer: Self-pay

## 2019-05-19 NOTE — Telephone Encounter (Signed)
COVID-19 Pre-Screening Questions:05/19/19  Do you currently have a fever (>100 F), chills or unexplained body aches?NO  Are you currently experiencing new cough, shortness of breath, sore throat, runny nose? NO .  Have you recently travelled outside the state of [place] in the last 14 days?NO .  Have you been in contact with someone that is currently pending confirmation of Covid19 testing or has been confirmed to have the Covid19 virus?  NO  **If the patient answers NO to ALL questions -  advise the patient to please call the clinic before coming to the office should any symptoms develop.    

## 2019-05-22 ENCOUNTER — Other Ambulatory Visit: Payer: Self-pay

## 2019-05-22 ENCOUNTER — Encounter: Payer: Self-pay | Admitting: Internal Medicine

## 2019-05-22 ENCOUNTER — Ambulatory Visit (INDEPENDENT_AMBULATORY_CARE_PROVIDER_SITE_OTHER): Payer: Medicare Other | Admitting: Internal Medicine

## 2019-05-22 DIAGNOSIS — Z23 Encounter for immunization: Secondary | ICD-10-CM

## 2019-05-22 DIAGNOSIS — B20 Human immunodeficiency virus [HIV] disease: Secondary | ICD-10-CM

## 2019-05-22 DIAGNOSIS — F17210 Nicotine dependence, cigarettes, uncomplicated: Secondary | ICD-10-CM

## 2019-05-22 DIAGNOSIS — F332 Major depressive disorder, recurrent severe without psychotic features: Secondary | ICD-10-CM | POA: Diagnosis not present

## 2019-05-22 NOTE — Assessment & Plan Note (Signed)
I asked him to consider trying to cut down and quit smoking.

## 2019-05-22 NOTE — Assessment & Plan Note (Signed)
His chronic depression and anxiety are mild and stable.  I encouraged him to continue to speak with his counselor on a regular basis.

## 2019-05-22 NOTE — Progress Notes (Signed)
Patient Active Problem List   Diagnosis Date Noted  . HIV disease ([place]) 06/20/2014    Priority: High  . Cervical strain 09/16/2018  . Assessment of effects of psychotropic drug in patient at risk for metabolic syndrome 46/27/0350  . Hypertension 10/05/2016  . MDD (major depressive disorder), recurrent severe, without psychosis ([place]) 02/25/2016  . Cannabis use disorder, severe, dependence ([place]) 02/25/2016  . Bipolar disorder ([place]) 06/21/2014  . Anxiety 06/21/2014  . Cigarette smoker 06/21/2014  . Dyslipidemia 06/20/2014    Patient's Medications  New Prescriptions   No medications on file  Previous Medications   ACETAMINOPHEN (TYLENOL) 500 MG TABLET    Take 500 mg by mouth every 6 (six) hours as needed for mild pain.   AMLODIPINE (NORVASC) 5 MG TABLET    TAKE 1 TABLET BY MOUTH ONCE DAILY   ATORVASTATIN (LIPITOR) 40 MG TABLET    Take 1 tablet (40 mg total) by mouth daily.   CETIRIZINE (ZYRTEC) 10 MG TABLET    Take 1 tablet (10 mg total) by mouth daily.   DOXEPIN (SINEQUAN) 10 MG CAPSULE    Take 10 mg by mouth at bedtime.   FLUOXETINE (PROZAC) 20 MG CAPSULE    Take 1 capsule (20 mg total) by mouth daily. For depression   IPRATROPIUM (ATROVENT) 0.03 % NASAL SPRAY    Place 2 sprays into both nostrils every 12 (twelve) hours.   METHOCARBAMOL (ROBAXIN) 500 MG TABLET    Take 1 tablet (500 mg total) by mouth 2 (two) times daily.   NAPROXEN (NAPROSYN) 500 MG TABLET    Take 1 tablet (500 mg total) by mouth 2 (two) times daily as needed for mild pain or moderate pain (neck).   OLANZAPINE (ZYPREXA) 15 MG TABLET    Take 1 tablet (15 mg total) by mouth at bedtime. For mood control   OLANZAPINE (ZYPREXA) 2.5 MG TABLET    Take 2.5 mg by mouth 2 (two) times daily. Morning and evening (evening dose with the 36m tablet=17.512m   PRAZOSIN (MINIPRESS) 2 MG CAPSULE    Take 2 mg by mouth at bedtime.   PREZCOBIX 800-150 MG TABLET    TAKE 1 TABLET BY MOUTH EVERY DAY WITH FOOD, SWALLOW WHOLE. DO  NOT CRUSH, BREAK, OR CHEW   TRIUMEQ 600-50-300 MG TABLET    TAKE 1 TABLET BY MOUTH ONCE DAILY  Modified Medications   No medications on file  Discontinued Medications   No medications on file    Subjective: Jeffery [name] in for his routine HIV follow-up visit.  He has had no problems obtaining or tolerating his Triumeq or Prezcobix.  He takes them each evening before bedtime.  He recalls missing 2-3 doses in the past 6 months.  This occurred when he was out later than normal and forgot to take the medications.  He states that his chronic anxiety and depression are stable.  He has not met with his counselor since the Covid pandemic began but has talked to him by phone.  He continues to smoke cigarettes and does not feel like he can quit at this time.  Review of Systems: Review of Systems  Constitutional: Negative for chills, diaphoresis, fever, malaise/fatigue and weight loss.  HENT: Negative for sore throat.   Respiratory: Negative for cough, sputum production and shortness of breath.   Cardiovascular: Negative for chest pain.  Gastrointestinal: Negative for abdominal pain, diarrhea, heartburn, nausea and vomiting.  Genitourinary: Negative for dysuria and frequency.  Musculoskeletal: Negative for joint pain and myalgias.  Skin: Negative for rash.  Neurological: Negative for dizziness and headaches.  Psychiatric/Behavioral: Positive for depression. Negative for substance abuse. The patient is nervous/anxious.     Past Medical History:  Diagnosis Date  . Anxiety   . Chlamydia   . Depression   . Drug induced constipation   . Hemorrhoids   . HIV infection ([place])   . Hyperlipidemia   . Hypertension    Pt went off meds on his own; has been monitored without any problems  . Male-to-male transgender person    former history   . Nicotine dependence   . Syphilis 2013 and 2015    Social History   Tobacco Use  . Smoking status: Light Tobacco Smoker    Packs/day: 0.25    Types:  Cigarettes  . Smokeless tobacco: Never Used  . Tobacco comment: 5 cigarettes a day  Substance Use Topics  . Alcohol use: Yes    Alcohol/week: 0.0 standard drinks    Comment: occasional  . Drug use: Yes    Frequency: 3.0 times per week    Types: Marijuana    Family History  Problem Relation Age of Onset  . Hypertension Father   . Hypertension Maternal Grandmother     Allergies  Allergen Reactions  . Bee Venom Swelling    Large local reactions  . Lisinopril Swelling    Facial and lip swelling    Health Maintenance  Topic Date Due  . INFLUENZA VACCINE  02/04/2019  . TETANUS/TDAP  06/16/2026  . HIV Screening  Completed    Objective:  Vitals:   05/22/19 1416  BP: (!) 150/84  Pulse: 83  Weight: 160 lb (72.6 kg)   Body mass index is 25.82 kg/m.  Physical Exam Constitutional:      Comments: He is in good spirits.  HENT:     Mouth/Throat:     Pharynx: No oropharyngeal exudate.  Eyes:     Conjunctiva/sclera: Conjunctivae normal.  Cardiovascular:     Rate and Rhythm: Normal rate and regular rhythm.     Heart sounds: No murmur.  Pulmonary:     Effort: Pulmonary effort is normal.     Breath sounds: Normal breath sounds.  Abdominal:     Palpations: Abdomen is soft. There is no mass.     Tenderness: There is no abdominal tenderness.  Musculoskeletal: Normal range of motion.  Skin:    Findings: No rash.  Neurological:     Mental Status: He is alert and oriented to person, place, and time.  Psychiatric:        Mood and Affect: Mood normal.     Lab Results Lab Results  Component Value Date   WBC 11.9 (H) 05/04/2019   HGB 14.3 05/04/2019   HCT 42.0 05/04/2019   MCV 95.9 05/04/2019   PLT 309 05/04/2019    Lab Results  Component Value Date   CREATININE 1.01 05/04/2019   BUN 7 05/04/2019   NA 140 05/04/2019   K 4.0 05/04/2019   CL 107 05/04/2019   CO2 26 05/04/2019    Lab Results  Component Value Date   ALT 11 05/04/2019   AST 16 05/04/2019    ALKPHOS 72 11/30/2017   BILITOT 0.6 05/04/2019    Lab Results  Component Value Date   CHOL 281 (H) 05/04/2019   HDL 39 (L) 05/04/2019   LDLCALC 210 (H) 05/04/2019   TRIG 152 (H) 05/04/2019   CHOLHDL 7.2 (H) 05/04/2019  Lab Results  Component Value Date   LABRPR REACTIVE (A) 05/04/2019   RPRTITER 1:4 (H) 05/04/2019   HIV 1 RNA Quant (copies/mL)  Date Value  05/04/2019 <20 NOT DETECTED  04/19/2018 <20 NOT DETECTED  04/28/2017 43 (H)   CD4 T Cell Abs (/uL)  Date Value  05/04/2019 1,340  04/19/2018 1,180  04/28/2017 1,530     Problem List Items Addressed This Visit      High   HIV disease ([place])    His infection remains under excellent, long-term control.  I encouraged him to do his best to not miss a single dose of his medications.  He received influenza vaccine today.  He will follow-up after lab work in 1 year.      Relevant Orders   1 Year CBC   1 Year CD4   1 Year CMP   1 Year RPR   1 Year VL     Unprioritized   MDD (major depressive disorder), recurrent severe, without psychosis ([place])    His chronic depression and anxiety are mild and stable.  I encouraged him to continue to speak with his counselor on a regular basis.      Cigarette smoker    I asked him to consider trying to cut down and quit smoking.           Jeffery Bickers, MD [facility] [phone] pager   [phone] cell 05/22/2019, 2:34 PM

## 2019-05-22 NOTE — Assessment & Plan Note (Signed)
His infection remains under excellent, long-term control.  I encouraged him to do his best to not miss a single dose of his medications.  He received influenza vaccine today.  He will follow-up after lab work in 1 year.

## 2019-07-27 ENCOUNTER — Other Ambulatory Visit: Payer: Self-pay

## 2019-07-27 DIAGNOSIS — B20 Human immunodeficiency virus [HIV] disease: Secondary | ICD-10-CM

## 2019-07-27 MED ORDER — TRIUMEQ 600-50-300 MG PO TABS: 1.0000 | ORAL_TABLET | Freq: Every day | ORAL | 5 refills | Status: DC

## 2019-07-27 MED ORDER — PREZCOBIX 800-150 MG PO TABS: ORAL_TABLET | ORAL | 5 refills | Status: DC

## 2019-10-03 ENCOUNTER — Telehealth (HOSPITAL_COMMUNITY): Payer: Self-pay | Admitting: Physician Assistant

## 2019-10-03 ENCOUNTER — Encounter (HOSPITAL_COMMUNITY): Payer: Self-pay

## 2019-10-03 ENCOUNTER — Other Ambulatory Visit: Payer: Self-pay

## 2019-10-03 ENCOUNTER — Other Ambulatory Visit (HOSPITAL_COMMUNITY): Payer: Self-pay | Admitting: Physician Assistant

## 2019-10-03 ENCOUNTER — Ambulatory Visit (HOSPITAL_COMMUNITY)
Admit: 2019-10-03 | Discharge: 2019-10-03 | Disposition: A | Discharge status: Home / Self Care | Payer: Medicare Other | Attending: Physician Assistant | Admitting: Physician Assistant

## 2019-10-03 ENCOUNTER — Ambulatory Visit (HOSPITAL_COMMUNITY)
Admission: EM | Admit: 2019-10-03 | Discharge: 2019-10-03 | Disposition: A | Discharge status: Home / Self Care | Payer: Medicare Other | Attending: Physician Assistant | Admitting: Physician Assistant

## 2019-10-03 DIAGNOSIS — N50811 Right testicular pain: Secondary | ICD-10-CM | POA: Diagnosis not present

## 2019-10-03 DIAGNOSIS — N5089 Other specified disorders of the male genital organs: Secondary | ICD-10-CM | POA: Insufficient documentation

## 2019-10-03 DIAGNOSIS — N451 Epididymitis: Secondary | ICD-10-CM

## 2019-10-03 MED ORDER — IBUPROFEN 800 MG PO TABS: 800.0000 mg | ORAL_TABLET | Freq: Three times a day (TID) | ORAL | 0 refills | Status: DC

## 2019-10-03 MED ORDER — DOXYCYCLINE HYCLATE 100 MG PO CAPS: 100.0000 mg | ORAL_CAPSULE | Freq: Two times a day (BID) | ORAL | 0 refills | Status: DC

## 2019-10-03 NOTE — Telephone Encounter (Signed)
Patient called and notified of results of ultrasound today.  Sounds most consistent with epididymitis or orchitis, and ruled out testicular torsion.  Instructed patient we will start doxycycline today and pending swab results will alter treatment if necessary.  Will utilize ibuprofen for pain management at this time.  Cool or warm compresses as preferred.  Verbalizes understanding.

## 2019-10-03 NOTE — Discharge Instructions (Signed)
Please report to [facility] hospital admitting area for further direction on getting your ultrasound as we need to rule out more serious problems.  We have these results we will contact you with treatment moving forward.

## 2019-10-03 NOTE — ED Triage Notes (Signed)
Pt c/o RLQ abdom pain onset yesterday and also c/o right testicular pain, swelling and a hard "lump".  Denies fever, chills, n/v/d.

## 2019-10-03 NOTE — ED Provider Notes (Signed)
[facility]    CSN: [id number] Arrival date & time: 10/03/19  1003      History   Chief Complaint Chief Complaint  Patient presents with  . Abdominal Pain  . Testicle Pain    HPI Jeffery [name] is a 29 y.o. male.   Patient presents for 1 to 2-day history of right-sided testicular pain that is now progressed as right-sided and lower abdominal pain.  Pain initially started primarily at the right testicle however now he does have pain in his left testicle as well.  Pain is described as throbbing.  He has noticed swelling and a tender mass that is on the bottom backside of his right testicle.  Laying down seems to help relieve some of the pain.  He denies any recent penile discharge.  Has painful urination, frequency urination or urgency.  Denies fever.  He has had some nausea but no vomiting.  Does report some chills while in the clinic today.  Denies a history of similar episodes.  Has remote history of sexually transmitted disease.     Past Medical History:  Diagnosis Date  . Anxiety   . Chlamydia   . Depression   . Drug induced constipation   . Hemorrhoids   . HIV infection ([facility])   . Hyperlipidemia   . Hypertension    Pt went off meds on his own; has been monitored without any problems  . Male-to-male transgender person    former history   . Nicotine dependence   . Syphilis 2013 and 2015    Patient Active Problem List   Diagnosis Date Noted  . Cervical strain 09/16/2018  . Assessment of effects of psychotropic drug in patient at risk for metabolic syndrome 09/16/2018  . Hypertension 10/05/2016  . MDD (major depressive disorder), recurrent severe, without psychosis ([facility]) 02/25/2016  . Cannabis use disorder, severe, dependence ([facility]) 02/25/2016  . Bipolar disorder ([facility]) 06/21/2014  . Anxiety 06/21/2014  . Cigarette smoker 06/21/2014  . HIV disease ([facility]) 06/20/2014  . Dyslipidemia 06/20/2014    History reviewed. No pertinent surgical  history.     Home Medications    Prior to Admission medications   Medication Sig Start Date End Date Taking? Authorizing Provider  abacavir-dolutegravir-lamiVUDine (TRIUMEQ) 600-50-300 MG tablet Take 1 tablet by mouth daily. 07/27/19   Cliffton Asters, MD  acetaminophen (TYLENOL) 500 MG tablet Take 500 mg by mouth every 6 (six) hours as needed for mild pain.    [provider]  amLODipine (NORVASC) 5 MG tablet TAKE 1 TABLET BY MOUTH ONCE DAILY Patient taking differently: Take 5 mg by mouth daily.  04/25/18   Cliffton Asters, MD  atorvastatin (LIPITOR) 40 MG tablet Take 1 tablet (40 mg total) by mouth daily. 09/16/18   Garnette Gunner, MD  cetirizine (ZYRTEC) 10 MG tablet Take 1 tablet (10 mg total) by mouth daily. 03/03/19   Phelps, Erin O, PA-C  darunavir-cobicistat (PREZCOBIX) 800-150 MG tablet TAKE 1 TABLET BY MOUTH EVERY DAY WITH FOOD, SWALLOW WHOLE. DO NOT CRUSH, BREAK, OR CHEW 07/27/19   Cliffton Asters, MD  doxepin (SINEQUAN) 10 MG capsule Take 10 mg by mouth at bedtime.    [provider]  doxycycline (VIBRAMYCIN) 100 MG capsule Take 1 capsule (100 mg total) by mouth 2 (two) times daily. 10/03/19   Jestine Bicknell, Veryl Speak, PA-C  FLUoxetine (PROZAC) 20 MG capsule Take 1 capsule (20 mg total) by mouth daily. For depression 03/02/16   Armandina Stammer I, NP  ibuprofen (ADVIL) 800  MG tablet Take 1 tablet (800 mg total) by mouth 3 (three) times daily. 10/03/19   Lonni Dirden, Veryl Speak, PA-C  ipratropium (ATROVENT) 0.03 % nasal spray Place 2 sprays into both nostrils every 12 (twelve) hours. 03/03/19   Lurene Shadow, PA-C  methocarbamol (ROBAXIN) 500 MG tablet Take 1 tablet (500 mg total) by mouth 2 (two) times daily. 09/01/18   Law, Waylan Boga, PA-C  naproxen (NAPROSYN) 500 MG tablet Take 1 tablet (500 mg total) by mouth 2 (two) times daily as needed for mild pain or moderate pain (neck). 09/16/18   Garnette Gunner, MD  OLANZapine (ZYPREXA) 15 MG tablet Take 1 tablet (15 mg total) by mouth at bedtime.  For mood control 03/02/16   Armandina Stammer I, NP  OLANZapine (ZYPREXA) 2.5 MG tablet Take 2.5 mg by mouth 2 (two) times daily. Morning and evening (evening dose with the 15mg  tablet=17.5mg )    [provider]  prazosin (MINIPRESS) 2 MG capsule Take 2 mg by mouth at bedtime.    [provider]    Family History Family History  Problem Relation Age of Onset  . Hypertension Father   . Hypertension Maternal Grandmother     Social History Social History   Tobacco Use  . Smoking status: Light Tobacco Smoker    Packs/day: 0.25    Types: Cigarettes  . Smokeless tobacco: Never Used  . Tobacco comment: 5 cigarettes a day  Substance Use Topics  . Alcohol use: Yes    Alcohol/week: 0.0 standard drinks    Comment: occasional  . Drug use: Yes    Frequency: 3.0 times per week    Types: Marijuana     Allergies   Bee venom and Lisinopril   Review of Systems Review of Systems  Constitutional: Positive for chills. Negative for fever.  Gastrointestinal: Positive for abdominal pain and nausea. Negative for vomiting.  Genitourinary: Positive for scrotal swelling and testicular pain. Negative for discharge, dysuria, frequency, genital sores, hematuria, penile pain, penile swelling and urgency.     Physical Exam Triage Vital Signs ED Triage Vitals  Enc Vitals Group     BP 10/03/19 1042 (!) 144/99     Pulse Rate 10/03/19 1042 88     Resp 10/03/19 1042 18     Temp 10/03/19 1042 98 F (36.7 C)     Temp Source 10/03/19 1042 Oral     SpO2 10/03/19 1042 99 %     Weight --      Height --      Head Circumference --      Peak Flow --      Pain Score 10/03/19 1040 9     Pain Loc --      Pain Edu? --      Excl. in GC? --    No data found.  Updated Vital Signs BP (!) 144/99 (BP Location: Right Arm)   Pulse 88   Temp 98 F (36.7 C) (Oral)   Resp 18   SpO2 99%   Visual Acuity Right Eye Distance:   Left Eye Distance:   Bilateral Distance:    Right Eye Near:    Left Eye Near:    Bilateral Near:     Physical Exam Vitals and nursing note reviewed.  Constitutional:      General: He is in acute distress.     Appearance: He is well-developed. He is ill-appearing.  HENT:     Head: Normocephalic and atraumatic.  Eyes:  Conjunctiva/sclera: Conjunctivae normal.  Cardiovascular:     Rate and Rhythm: Normal rate and regular rhythm.     Heart sounds: No murmur.  Pulmonary:     Effort: Pulmonary effort is normal. No respiratory distress.     Breath sounds: Normal breath sounds.  Abdominal:     Palpations: Abdomen is soft.     Tenderness: There is abdominal tenderness in the right lower quadrant. There is no right CVA tenderness or left CVA tenderness.  Genitourinary:    Comments: No penile lesion or scrotal lesion.  No penile discharge.  There is swelling and tenderness with a palpable mass inferior posterior margin of the epididymis.  There is tenderness in the right groin as well. Musculoskeletal:     Cervical back: Neck supple.  Skin:    General: Skin is warm and dry.  Neurological:     Mental Status: He is alert.      UC Treatments / Results  Labs (all labs ordered are listed, but only abnormal results are displayed) Labs Reviewed  CYTOLOGY, (ORAL, ANAL, URETHRAL) ANCILLARY ONLY    EKG   Radiology US SCROTUM W/DOPPLER  Result Date: 10/03/2019 CLINICAL DATA:  Right testicular pain since yesterday. EXAM: SCROTAL ULTRASOUND DOPPLER ULTRASOUND OF THE TESTICLES TECHNIQUE: Complete ultrasound examination of the testicles, epididymis, and other scrotal structures was performed. Color and spectral Doppler ultrasound were also utilized to evaluate blood flow to the testicles. COMPARISON:  None. FINDINGS: Right testicle Measurements: 4.6 x 2.6 x 2.8 cm. No mass or microlithiasis visualized. There is increased vascularity within the right testicle. Left testicle Measurements: 4.5 x 1.9 x 2.8 cm. No mass or microlithiasis visualized. Right  epididymis: There is increased vascularity within the right testicle. Left epididymis:  Small left-sided epididymal head cysts are noted. Hydrocele:  None visualized. Varicocele:  None visualized. Pulsed Doppler interrogation of both testes demonstrates normal low resistance arterial and venous waveforms bilaterally. IMPRESSION: 1. No evidence for testicular torsion. 2. Findings most consistent with right-sided epididymo-orchitis in the appropriate clinical setting. Electronically Signed   By: Katherine Mantle M.D.   On: 10/03/2019 15:21    Procedures Procedures (including critical care time)  Medications Ordered in UC Medications - No data to display  Initial Impression / Assessment and Plan / UC Course  I have reviewed the triage vital signs and the nursing notes.  Pertinent labs & imaging results that were available during my care of the patient were reviewed by me and considered in my medical decision making (see chart for details).     #Testicular pain #Testicular mass Patient 29 year old male presenting with acute testicular pain.  Patient was sent for scrotal vascular ultrasound to rule out torsion.  This test returned with no evidence of torsion and findings consistent with epiddiymyitis.  Prescription management was made from telephone note with doxycycline.  Swab was sent for gonorrhea chlamydia trichomonas.  Pending these results will adjust treatment as necessary.  Ibuprofen at 600 mg prescribed via telephone note as well for pain.   Final Clinical Impressions(s) / UC Diagnoses   Final diagnoses:  Testicular pain, right  Testicular mass     Discharge Instructions     Please report to [facility] hospital admitting area for further direction on getting your ultrasound as we need to rule out more serious problems.  We have these results we will contact you with treatment moving forward.    ED Prescriptions    None     PDMP not reviewed this encounter.  Purnell Shoemaker, PA-C 10/03/19 1902

## 2019-10-04 LAB — CYTOLOGY, (ORAL, ANAL, URETHRAL) ANCILLARY ONLY
Chlamydia: NEGATIVE
Neisseria Gonorrhea: NEGATIVE
Trichomonas: NEGATIVE

## 2019-10-05 ENCOUNTER — Telehealth (HOSPITAL_COMMUNITY): Payer: Self-pay

## 2019-10-05 NOTE — Telephone Encounter (Signed)
Pt called for labs.  Advised that swab was negative.  Pt states he has been taking the Rx but spot on testicle is becoming larger and more painful.  Recommended that pt come in to be re-evaluated.  Pt verbalized understanding and denies further questions at this time.

## 2019-10-06 ENCOUNTER — Telehealth (HOSPITAL_COMMUNITY): Payer: Self-pay | Admitting: Physician Assistant

## 2019-10-06 DIAGNOSIS — R369 Urethral discharge, unspecified: Secondary | ICD-10-CM

## 2019-10-06 DIAGNOSIS — N5089 Other specified disorders of the male genital organs: Secondary | ICD-10-CM

## 2019-10-06 NOTE — Telephone Encounter (Signed)
Called patient to discuss symptoms as follow up from 3/30 visit,  after RN received phone call on 4/1. Patient reports decreased global swelling but endorses that mass has grown since visit. Now also endorses yellow urethral discharge. States pain is worse than it was prior. Continues to have some chills and nausea. No vomiting. Patient had [place] on 3/30 to rule out torsion. Started on Doxycycline pending swabs, these returned negative. Given worsening symptoms and possible concern for abscess formation decision for patient to follow up in ED was made, as he likely needs additional imaging and urology consult. I discussed this case by phone with Supervising physician.   Patient verbalizes understanding of why evaluation in the ED is necessary and states he will report there today.

## 2019-10-07 ENCOUNTER — Emergency Department (HOSPITAL_COMMUNITY)
Admission: EM | Admit: 2019-10-07 | Discharge: 2019-10-07 | Disposition: A | Discharge status: Home / Self Care | Payer: Medicare Other | Attending: Emergency Medicine | Admitting: Emergency Medicine

## 2019-10-07 ENCOUNTER — Other Ambulatory Visit: Payer: Self-pay

## 2019-10-07 ENCOUNTER — Encounter (HOSPITAL_COMMUNITY): Payer: Self-pay

## 2019-10-07 ENCOUNTER — Telehealth (HOSPITAL_COMMUNITY): Payer: Self-pay | Admitting: Physician Assistant

## 2019-10-07 DIAGNOSIS — I10 Essential (primary) hypertension: Secondary | ICD-10-CM | POA: Diagnosis not present

## 2019-10-07 DIAGNOSIS — Z79899 Other long term (current) drug therapy: Secondary | ICD-10-CM | POA: Insufficient documentation

## 2019-10-07 DIAGNOSIS — N342 Other urethritis: Secondary | ICD-10-CM | POA: Insufficient documentation

## 2019-10-07 DIAGNOSIS — N453 Epididymo-orchitis: Secondary | ICD-10-CM

## 2019-10-07 DIAGNOSIS — Z21 Asymptomatic human immunodeficiency virus [HIV] infection status: Secondary | ICD-10-CM | POA: Diagnosis not present

## 2019-10-07 DIAGNOSIS — R220 Localized swelling, mass and lump, head: Secondary | ICD-10-CM | POA: Insufficient documentation

## 2019-10-07 DIAGNOSIS — N5089 Other specified disorders of the male genital organs: Secondary | ICD-10-CM | POA: Diagnosis present

## 2019-10-07 DIAGNOSIS — F17210 Nicotine dependence, cigarettes, uncomplicated: Secondary | ICD-10-CM | POA: Diagnosis not present

## 2019-10-07 DIAGNOSIS — F649 Gender identity disorder, unspecified: Secondary | ICD-10-CM | POA: Insufficient documentation

## 2019-10-07 LAB — URINALYSIS, ROUTINE W REFLEX MICROSCOPIC
Bacteria, UA: NONE SEEN
Bilirubin Urine: NEGATIVE
Glucose, UA: NEGATIVE mg/dL
Hgb urine dipstick: NEGATIVE
Ketones, ur: NEGATIVE mg/dL
Nitrite: NEGATIVE
Protein, ur: 30 mg/dL — AB
Specific Gravity, Urine: 1.027 (ref 1.005–1.030)
pH: 5 (ref 5.0–8.0)

## 2019-10-07 MED ORDER — LIDOCAINE HCL (PF) 1 % IJ SOLN
INTRAMUSCULAR | Status: AC
  Administered 2019-10-07: 2 mL
  Filled 2019-10-07: qty 5

## 2019-10-07 MED ORDER — CEFTRIAXONE SODIUM 500 MG IJ SOLR
500.0000 mg | Freq: Once | INTRAMUSCULAR | Status: AC
  Administered 2019-10-07: 500 mg via INTRAMUSCULAR
  Filled 2019-10-07: qty 500

## 2019-10-07 NOTE — Telephone Encounter (Signed)
This provider was conducting review of patient chart to ensure patient was re-evaluated following previous in person and phone encounters,however it was shown that the patient has not followrf up in ED or Urgent Care.   Contacted patient to reiterate the importance of being evaluated as there is concern for abscess formation. Patient reports mass has not shrunken at this point despite multiple days of doxycycline. We discussed that doxycycline can cover many pathogens but if there is abscess formation that antibiotics alone will not solve the problem. I reiterated that I and my supervising physician felt it best he be evaluated in the emergency department as they have more capabilities than urgent care, however stated that if the patient is not willing to go to the Emergency department, I at a minimum would like him seen in person at Urgent Care today. Patient again states he will go be evaluated today and verbalizes understanding of the risks of possible abscess and spreading infection.

## 2019-10-07 NOTE — ED Triage Notes (Signed)
Pt returning to ED today per [facility] recommendation.  Pt was seen for mass in right testicle.  Was given antibiotics but mass is getting larger and now having yellowish penile discharge.

## 2019-10-07 NOTE — ED Provider Notes (Signed)
[facility] EMERGENCY DEPARTMENT Provider Note   CSN: [id number] Arrival date & time: 10/07/19  1707     History Chief Complaint  Patient presents with  . Mass    Jeffery [name] is a 29 y.o. male.  Patient with history of HIV, normal CD4 count --presents the emergency department today with complaint of right testicular swelling.  Patient was evaluated for this initially on 10/03/2019 at urgent care.  He had an ultrasound at that time showing changes consistent with a right-sided epididymo-orchitis.  There wa no signs of torsion at that time.  Patient was started on doxycycline.  He has had a yellowish penile discharge in the past several days, resolved yesterday.  He states that the right testicle has become a little bit larger.  He called back urgent care and he was referred to the emergency department for recheck.  Patient denies fevers.  He has developed a cold sore on his lip for which he is using Abreva.  Patient has not had any acutely worsening testicular pain or vomiting to suggest intermittent or recurrent torsion.  He denies other sores or lesions in the genital area.        Past Medical History:  Diagnosis Date  . Anxiety   . Chlamydia   . Depression   . Drug induced constipation   . Hemorrhoids   . HIV infection (HCC)   . Hyperlipidemia   . Hypertension    Pt went off meds on his own; has been monitored without any problems  . Male-to-male transgender person    former history   . Nicotine dependence   . Syphilis 2013 and 2015    Patient Active Problem List   Diagnosis Date Noted  . Cervical strain 09/16/2018  . Assessment of effects of psychotropic drug in patient at risk for metabolic syndrome 09/16/2018  . Hypertension 10/05/2016  . MDD (major depressive disorder), recurrent severe, without psychosis (HCC) 02/25/2016  . Cannabis use disorder, severe, dependence (HCC) 02/25/2016  . Bipolar disorder (HCC) 06/21/2014  . Anxiety 06/21/2014   . Cigarette smoker 06/21/2014  . HIV disease (HCC) 06/20/2014  . Dyslipidemia 06/20/2014    History reviewed. No pertinent surgical history.     Family History  Problem Relation Age of Onset  . Hypertension Father   . Hypertension Maternal Grandmother     Social History   Tobacco Use  . Smoking status: Light Tobacco Smoker    Packs/day: 0.25    Types: Cigarettes  . Smokeless tobacco: Never Used  . Tobacco comment: 5 cigarettes a day  Substance Use Topics  . Alcohol use: Yes    Alcohol/week: 0.0 standard drinks    Comment: occasional  . Drug use: Yes    Frequency: 3.0 times per week    Types: Marijuana    Home Medications Prior to Admission medications   Medication Sig Start Date End Date Taking? Authorizing Provider  abacavir-dolutegravir-lamiVUDine (TRIUMEQ) 600-50-300 MG tablet Take 1 tablet by mouth daily. 07/27/19   Cliffton Asters, MD  acetaminophen (TYLENOL) 500 MG tablet Take 500 mg by mouth every 6 (six) hours as needed for mild pain.    [provider]  amLODipine (NORVASC) 5 MG tablet TAKE 1 TABLET BY MOUTH ONCE DAILY Patient taking differently: Take 5 mg by mouth daily.  04/25/18   Cliffton Asters, MD  atorvastatin (LIPITOR) 40 MG tablet Take 1 tablet (40 mg total) by mouth daily. 09/16/18   Garnette Gunner, MD  cetirizine (ZYRTEC) 10 MG  tablet Take 1 tablet (10 mg total) by mouth daily. 03/03/19   Phelps, Erin O, PA-C  darunavir-cobicistat (PREZCOBIX) 800-150 MG tablet TAKE 1 TABLET BY MOUTH EVERY DAY WITH FOOD, SWALLOW WHOLE. DO NOT CRUSH, BREAK, OR CHEW 07/27/19   Cliffton Asters, MD  doxepin (SINEQUAN) 10 MG capsule Take 10 mg by mouth at bedtime.    [provider]  doxycycline (VIBRAMYCIN) 100 MG capsule Take 1 capsule (100 mg total) by mouth 2 (two) times daily. 10/03/19   Darr, Veryl Speak, PA-C  FLUoxetine (PROZAC) 20 MG capsule Take 1 capsule (20 mg total) by mouth daily. For depression 03/02/16   Armandina Stammer I, NP  ibuprofen (ADVIL) 800 MG  tablet Take 1 tablet (800 mg total) by mouth 3 (three) times daily. 10/03/19   Darr, Veryl Speak, PA-C  ipratropium (ATROVENT) 0.03 % nasal spray Place 2 sprays into both nostrils every 12 (twelve) hours. 03/03/19   Lurene Shadow, PA-C  methocarbamol (ROBAXIN) 500 MG tablet Take 1 tablet (500 mg total) by mouth 2 (two) times daily. 09/01/18   Law, Waylan Boga, PA-C  naproxen (NAPROSYN) 500 MG tablet Take 1 tablet (500 mg total) by mouth 2 (two) times daily as needed for mild pain or moderate pain (neck). 09/16/18   Garnette Gunner, MD  OLANZapine (ZYPREXA) 15 MG tablet Take 1 tablet (15 mg total) by mouth at bedtime. For mood control 03/02/16   Armandina Stammer I, NP  OLANZapine (ZYPREXA) 2.5 MG tablet Take 2.5 mg by mouth 2 (two) times daily. Morning and evening (evening dose with the 15mg  tablet=17.5mg )    [provider]  prazosin (MINIPRESS) 2 MG capsule Take 2 mg by mouth at bedtime.    [provider]    Allergies    Bee venom and Lisinopril  Review of Systems   Review of Systems  Constitutional: Negative for fever.  HENT: Positive for facial swelling (Lip swollen). Negative for sore throat.   Eyes: Negative for discharge.  Gastrointestinal: Negative for rectal pain.  Genitourinary: Positive for discharge, scrotal swelling and testicular pain. Negative for dysuria, frequency, genital sores and penile pain.  Musculoskeletal: Negative for arthralgias.  Skin: Negative for rash.  Hematological: Negative for adenopathy.    Physical Exam Updated Vital Signs BP (!) 157/102   Pulse 83   Temp 98 F (36.7 C) (Oral)   Resp 16   Ht 5\' 6"  (1.676 m)   Wt 74.8 kg   SpO2 100%   BMI 26.63 kg/m   Physical Exam Vitals and nursing note reviewed.  Constitutional:      Appearance: He is well-developed.  HENT:     Head: Normocephalic and atraumatic.  Eyes:     General:        Right eye: No discharge.        Left eye: No discharge.     Conjunctiva/sclera: Conjunctivae normal.    Cardiovascular:     Rate and Rhythm: Normal rate and regular rhythm.     Heart sounds: Normal heart sounds.  Pulmonary:     Effort: Pulmonary effort is normal. No respiratory distress.     Breath sounds: Normal breath sounds.  Abdominal:     Palpations: Abdomen is soft.     Tenderness: There is no abdominal tenderness.  Genitourinary:    Penis: Normal.      Testes:        Right: Tenderness and swelling present.        Left: Mass or tenderness not  present.     Epididymis:     Right: Enlarged. Tenderness present.     Comments: Patient with exam consistent with a right-sided epididymal orchitis.  There is no overlying erythema or redness to suggest cellulitis.  I have low concern for abscess.  I can feel an enlarged firm epididymitis affixed to a mildly swollen right testicle.  This is consistent with ultrasound findings from 3/30. Musculoskeletal:     Cervical back: Normal range of motion and neck supple.  Skin:    General: Skin is warm and dry.  Neurological:     Mental Status: He is alert.     ED Results / Procedures / Treatments   Labs (all labs ordered are listed, but only abnormal results are displayed) Labs Reviewed  URINALYSIS, ROUTINE W REFLEX MICROSCOPIC - Abnormal; Notable for the following components:      Result Value   Protein, ur 30 (*)    Leukocytes,Ua TRACE (*)    All other components within normal limits    EKG None  Radiology No results found.  Procedures Procedures (including critical care time)  Medications Ordered in ED Medications  cefTRIAXone (ROCEPHIN) injection 500 mg (500 mg Intramuscular Given 10/07/19 1758)  lidocaine (PF) (XYLOCAINE) 1 % injection (2 mLs  Given 10/07/19 1758)    ED Course  I have reviewed the triage vital signs and the nursing notes.  Pertinent labs & imaging results that were available during my care of the patient were reviewed by me and considered in my medical decision making (see chart for details).  Patient seen  and examined.  Reviewed work-up and treatment from 4 days ago.  I reexamined the patient.  His exam is consistent with a right-sided epididymo-orchitis.  I do not see any signs of superficial cellulitis or abscess.  Patient appears well.  He does not have fevers to suggest worsening infection.  He has had some penile discharge, not present now.  Patient has not had acutely worsening or intermittent pain which would be suggestive of an intermittent torsion.  I do not feel that patient requires repeat ultrasonography at this point.  Patient had negative STI testing, however given reported penile discharge and pyuria I have high suspicion for urethritis from gonorrhea or chlamydia.  He has already been started on and is taking doxycycline.  I will complete appropriate treatment with 500 mg IM Rocephin here.  Patient will continue to monitor symptoms over the weekend.  He is encouraged to return with fever or worsening testicular pain.  If he is not improving by Monday he is encouraged to call urology referral, which will be given, for further evaluation.  Patient verbalizes agreement and is comfortable with plan.  Vital signs reviewed and are as follows: BP (!) 157/102   Pulse 83   Temp 98 F (36.7 C) (Oral)   Resp 16   Ht 5\' 6"  (1.676 m)   Wt 74.8 kg   SpO2 100%   BMI 26.63 kg/m      MDM Rules/Calculators/A&P                      Patient with continued symptoms consistent with right-sided epididymal orchitis and urethritis.  Patient given Rocephin today to complete appropriate treatment for gonorrhea and chlamydia.  No signs or suspicion for torsion.  Patient will follow up with urology if he does not see any improvement in the next 36 hours or return with worsening symptoms.     Final Clinical  Impression(s) / ED Diagnoses Final diagnoses:  Epididymo-orchitis, acute  Urethritis    Rx / DC Orders ED Discharge Orders    None       Carlisle Cater, Hershal Coria 10/07/19 1802    Virgel Manifold, MD 10/07/19 1818

## 2019-10-07 NOTE — Discharge Instructions (Signed)
Please read and follow all provided instructions.  Your diagnoses today include:  1. Epididymo-orchitis, acute     Tests performed today include:  Vital signs. See below for your results today.   Medications:  For treatment of gonorrhea: You were treated with a rocephin (shot) today.   Home care instructions:  Read educational materials contained in this packet and follow any instructions provided.   Please call the urologist listed on Monday if you are not having improvement by that time or if you continue to feel a mass on the testicle.  Sexually transmitted disease testing also available at:   [facility] [place], [facility]  [address] [place], phone [phone] or [phone]    Monday - Friday, call for an appointment  Return instructions:   Please return to the Emergency Department if you experience worsening symptoms.   Return if you have acutely worsening testicular pain or swelling, vomiting, or fever.  Please return if you have any other emergent concerns.  Additional Information:  Your vital signs today were: BP (!) 157/102   Pulse 83   Temp 98 F (36.7 C) (Oral)   Resp 16   Ht 5\' 6"  (1.676 m)   Wt 74.8 kg   SpO2 100%   BMI 26.63 kg/m  If your blood pressure (BP) was elevated above 135/85 this visit, please have this repeated by your doctor within one month. --------------

## 2020-01-24 ENCOUNTER — Ambulatory Visit (HOSPITAL_COMMUNITY)
Admission: EM | Admit: 2020-01-24 | Discharge: 2020-01-24 | Disposition: A | Discharge status: Home / Self Care | Payer: Medicare Other | Attending: Family Medicine | Admitting: Family Medicine

## 2020-01-24 ENCOUNTER — Other Ambulatory Visit: Payer: Self-pay

## 2020-01-24 ENCOUNTER — Encounter (HOSPITAL_COMMUNITY): Payer: Self-pay | Admitting: Emergency Medicine

## 2020-01-24 DIAGNOSIS — R519 Headache, unspecified: Secondary | ICD-10-CM

## 2020-01-24 DIAGNOSIS — R5383 Other fatigue: Secondary | ICD-10-CM | POA: Insufficient documentation

## 2020-01-24 DIAGNOSIS — Z21 Asymptomatic human immunodeficiency virus [HIV] infection status: Secondary | ICD-10-CM | POA: Diagnosis not present

## 2020-01-24 DIAGNOSIS — E785 Hyperlipidemia, unspecified: Secondary | ICD-10-CM | POA: Diagnosis not present

## 2020-01-24 DIAGNOSIS — I10 Essential (primary) hypertension: Secondary | ICD-10-CM | POA: Insufficient documentation

## 2020-01-24 DIAGNOSIS — Z20822 Contact with and (suspected) exposure to covid-19: Secondary | ICD-10-CM | POA: Insufficient documentation

## 2020-01-24 DIAGNOSIS — F17210 Nicotine dependence, cigarettes, uncomplicated: Secondary | ICD-10-CM | POA: Diagnosis not present

## 2020-01-24 DIAGNOSIS — J329 Chronic sinusitis, unspecified: Secondary | ICD-10-CM | POA: Insufficient documentation

## 2020-01-24 MED ORDER — AMOXICILLIN-POT CLAVULANATE 875-125 MG PO TABS: 1.0000 | ORAL_TABLET | Freq: Two times a day (BID) | ORAL | 0 refills | Status: DC

## 2020-01-24 NOTE — ED Provider Notes (Addendum)
[facility]ARE CENTER   [id number] 01/24/20 Arrival Time: 1243  ASSESSMENT & PLAN:  1. Nonintractable episodic headache, unspecified headache type     Will tx for sinusitis. Normal neurological exam.  Meds ordered this encounter  Medications  . amoxicillin-clavulanate (AUGMENTIN) 875-125 MG tablet    Sig: Take 1 tablet by mouth every 12 (twelve) hours.    Dispense:  20 tablet    Refill:  0    COVID-19 testing sent. See letter/work note on file for self-isolation guidelines. OTC symptom care as needed.   Follow-up Information    [facility] EMERGENCY DEPARTMENT.   Specialty: Emergency Medicine Why: If symptoms worsen in any way. Contact information: [address] [id number] mc [place] [address] [phone]       Jackelyn Poling, DO.   Specialty: Family Medicine Why: As needed. Contact information: [address] [place] [address] [phone]               Reviewed expectations re: course of current medical issues. Questions answered. Outlined signs and symptoms indicating need for more acute intervention. Understanding verbalized. After Visit Summary given.   SUBJECTIVE: History from: patient. Jeffery [name] is a 29 y.o. male with controlled HIV per his reports. Past two days has been feeling fatigued with left frontal headache; not worst headache of life. Sleeping ok. No COVID exposures known. Has felt congested recently; questions relation to current symptoms. No extremity sensation changes or weakness. Normal PO intake without n/v/d. Ambulatory without difficulty. No OTC tx. Requests COVID testing and work note.   OBJECTIVE:  Vitals:   01/24/20 1330  BP: (!) 132/96  Pulse: 88  Resp: 18  Temp: 98.1 F (36.7 C)  TempSrc: Oral  SpO2: 98%    General appearance: alert; no distress Eyes: PERRLA; EOMI; conjunctiva normal HENT: [place]; AT; mild nasal congestion; frontal sinus TTP Neck: supple  Lungs:  speaks full sentences without difficulty; unlabored Extremities: no edema Skin: warm and dry Neurologic: normal gait Psychological: alert and cooperative; normal mood and affect  Labs:  Labs Reviewed  SARS CORONAVIRUS 2 (TAT 6-24 HRS)     Allergies  Allergen Reactions  . Bee Venom Swelling    Large local reactions  . Lisinopril Swelling    Facial and lip swelling    Past Medical History:  Diagnosis Date  . Anxiety   . Chlamydia   . Depression   . Drug induced constipation   . Hemorrhoids   . HIV infection (HCC)   . Hyperlipidemia   . Hypertension    Pt went off meds on his own; has been monitored without any problems  . Male-to-male transgender person    former history   . Nicotine dependence   . Syphilis 2013 and 2015   Social History   Socioeconomic History  . Marital status: Single    Spouse name: Not on file  . Number of children: Not on file  . Years of education: Not on file  . Highest education level: Not on file  Occupational History  . Occupation: restraunt    Employer: [employer]  Tobacco Use  . Smoking status: Light Tobacco Smoker    Packs/day: 0.25    Types: Cigarettes  . Smokeless tobacco: Never Used  . Tobacco comment: 5 cigarettes a day  Vaping Use  . Vaping Use: Never used  Substance and Sexual Activity  . Alcohol use: Yes    Alcohol/week: 0.0 standard drinks    Comment: occasional  .  Drug use: Yes    Frequency: 3.0 times per week    Types: Marijuana  . Sexual activity: Not Currently    Partners: Male    Comment: given condoms  Other Topics Concern  . Not on file  Social History Narrative  . Not on file   Social Determinants of Health   Financial Resource Strain:   . Difficulty of Paying Living Expenses:   Food Insecurity:   . Worried About Programme researcher, broadcasting/film/video in the Last Year:   . Barista in the Last Year:   Transportation Needs:   . Freight forwarder (Medical):   Marland Kitchen Lack of Transportation (Non-Medical):     Physical Activity:   . Days of Exercise per Week:   . Minutes of Exercise per Session:   Stress:   . Feeling of Stress :   Social Connections:   . Frequency of Communication with Friends and Family:   . Frequency of Social Gatherings with Friends and Family:   . Attends Religious Services:   . Active Member of Clubs or Organizations:   . Attends Banker Meetings:   Marland Kitchen Marital Status:   Intimate Partner Violence:   . Fear of Current or Ex-Partner:   . Emotionally Abused:   Marland Kitchen Physically Abused:   . Sexually Abused:    Family History  Problem Relation Age of Onset  . Hypertension Father   . Hypertension Maternal Grandmother    History reviewed. No pertinent surgical history.   Mardella Layman, MD 01/24/20 1421    Mardella Layman, MD 01/24/20 1426

## 2020-01-24 NOTE — ED Triage Notes (Signed)
Pt sts left sided HA x 2 days with episode of generalized weakness where he had to sit down today; pt requests covid test and note for work

## 2020-01-25 LAB — SARS CORONAVIRUS 2 (TAT 6-24 HRS): SARS Coronavirus 2: NEGATIVE
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
## 2020-03-09 ENCOUNTER — Other Ambulatory Visit: Payer: Self-pay

## 2020-03-09 ENCOUNTER — Emergency Department (HOSPITAL_COMMUNITY)
Admission: EM | Admit: 2020-03-09 | Discharge: 2020-03-10 | Disposition: A | Discharge status: Home / Self Care | Payer: Medicare Other | Attending: Emergency Medicine | Admitting: Emergency Medicine

## 2020-03-09 DIAGNOSIS — R55 Syncope and collapse: Secondary | ICD-10-CM | POA: Diagnosis not present

## 2020-03-09 DIAGNOSIS — R400 Somnolence: Secondary | ICD-10-CM | POA: Diagnosis not present

## 2020-03-09 DIAGNOSIS — Y92410 Unspecified street and highway as the place of occurrence of the external cause: Secondary | ICD-10-CM | POA: Diagnosis not present

## 2020-03-09 DIAGNOSIS — Y9389 Activity, other specified: Secondary | ICD-10-CM | POA: Insufficient documentation

## 2020-03-09 DIAGNOSIS — Y999 Unspecified external cause status: Secondary | ICD-10-CM | POA: Insufficient documentation

## 2020-03-09 DIAGNOSIS — R519 Headache, unspecified: Secondary | ICD-10-CM | POA: Insufficient documentation

## 2020-03-09 DIAGNOSIS — M545 Low back pain: Secondary | ICD-10-CM | POA: Diagnosis not present

## 2020-03-09 DIAGNOSIS — Z5321 Procedure and treatment not carried out due to patient leaving prior to being seen by health care provider: Secondary | ICD-10-CM | POA: Diagnosis not present

## 2020-03-09 LAB — CBC
HCT: 44.1 % (ref 39.0–52.0)
Hemoglobin: 14.6 g/dL (ref 13.0–17.0)
MCH: 32.2 pg (ref 26.0–34.0)
MCHC: 33.1 g/dL (ref 30.0–36.0)
MCV: 97.1 fL (ref 80.0–100.0)
Platelets: 321 10*3/uL (ref 150–400)
RBC: 4.54 MIL/uL (ref 4.22–5.81)
RDW: 13.4 % (ref 11.5–15.5)
WBC: 17.5 10*3/uL — ABNORMAL HIGH (ref 4.0–10.5)
nRBC: 0 % (ref 0.0–0.2)

## 2020-03-09 LAB — BASIC METABOLIC PANEL
Anion gap: 11 (ref 5–15)
BUN: 13 mg/dL (ref 6–20)
CO2: 22 mmol/L (ref 22–32)
Calcium: 9.2 mg/dL (ref 8.9–10.3)
Chloride: 103 mmol/L (ref 98–111)
Creatinine, Ser: 1.59 mg/dL — ABNORMAL HIGH (ref 0.61–1.24)
GFR calc Af Amer: >60 mL/min (ref >60)
GFR calc non Af Amer: 58 mL/min — ABNORMAL LOW (ref >60)
Glucose, Bld: 139 mg/dL — ABNORMAL HIGH (ref 70–99)
Potassium: 3.9 mmol/L (ref 3.5–5.1)
Sodium: 136 mmol/L (ref 135–145)

## 2020-03-09 LAB — CBG MONITORING, ED: Glucose-Capillary: 134 mg/dL — ABNORMAL HIGH (ref 70–99)
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
## 2020-03-10 NOTE — ED Notes (Signed)
No answer for treatment room. 

## 2020-03-27 DIAGNOSIS — F419 Anxiety disorder, unspecified: Secondary | ICD-10-CM | POA: Diagnosis not present

## 2020-03-27 DIAGNOSIS — F4310 Post-traumatic stress disorder, unspecified: Secondary | ICD-10-CM | POA: Diagnosis not present

## 2020-03-27 DIAGNOSIS — F17210 Nicotine dependence, cigarettes, uncomplicated: Secondary | ICD-10-CM | POA: Diagnosis not present

## 2020-03-27 DIAGNOSIS — F251 Schizoaffective disorder, depressive type: Secondary | ICD-10-CM | POA: Diagnosis not present

## 2020-05-06 ENCOUNTER — Other Ambulatory Visit: Payer: Medicare Other

## 2020-05-06 ENCOUNTER — Other Ambulatory Visit: Payer: Self-pay

## 2020-05-06 DIAGNOSIS — B20 Human immunodeficiency virus [HIV] disease: Secondary | ICD-10-CM | POA: Diagnosis not present

## 2020-05-07 LAB — T-HELPER CELL (CD4) - (RCID CLINIC ONLY)
CD4 % Helper T Cell: 32 % — ABNORMAL LOW (ref 33–65)
CD4 T Cell Abs: 858 /uL (ref 400–1790)

## 2020-05-09 LAB — COMPREHENSIVE METABOLIC PANEL
AG Ratio: 1.3 (calc) (ref 1.0–2.5)
ALT: 12 U/L (ref 9–46)
AST: 15 U/L (ref 10–40)
Albumin: 4.2 g/dL (ref 3.6–5.1)
Alkaline phosphatase (APISO): 100 U/L (ref 36–130)
BUN: 8 mg/dL (ref 7–25)
CO2: 24 mmol/L (ref 20–32)
Calcium: 9.6 mg/dL (ref 8.6–10.3)
Chloride: 101 mmol/L (ref 98–110)
Creat: 1.11 mg/dL (ref 0.60–1.35)
Globulin: 3.3 g/dL (calc) (ref 1.9–3.7)
Glucose, Bld: 103 mg/dL — ABNORMAL HIGH (ref 65–99)
Potassium: 3.9 mmol/L (ref 3.5–5.3)
Sodium: 137 mmol/L (ref 135–146)
Total Bilirubin: 0.6 mg/dL (ref 0.2–1.2)
Total Protein: 7.5 g/dL (ref 6.1–8.1)

## 2020-05-09 LAB — FLUORESCENT TREPONEMAL AB(FTA)-IGG-BLD: Fluorescent Treponemal ABS: REACTIVE — AB

## 2020-05-09 LAB — CBC
HCT: 47.6 % (ref 38.5–50.0)
Hemoglobin: 16.2 g/dL (ref 13.2–17.1)
MCH: 32.5 pg (ref 27.0–33.0)
MCHC: 34 g/dL (ref 32.0–36.0)
MCV: 95.6 fL (ref 80.0–100.0)
MPV: 10.4 fL (ref 7.5–12.5)
Platelets: 365 10*3/uL (ref 140–400)
RBC: 4.98 10*6/uL (ref 4.20–5.80)
RDW: 13.5 % (ref 11.0–15.0)
WBC: 15.1 10*3/uL — ABNORMAL HIGH (ref 3.8–10.8)

## 2020-05-09 LAB — RPR TITER: RPR Titer: 1:16 {titer} — ABNORMAL HIGH

## 2020-05-09 LAB — HIV-1 RNA QUANT-NO REFLEX-BLD
HIV 1 RNA Quant: <20 Copies/mL
HIV-1 RNA Quant, Log: <1.3 Log cps/mL

## 2020-05-09 LAB — RPR: RPR Ser Ql: REACTIVE — AB

## 2020-05-21 ENCOUNTER — Other Ambulatory Visit: Payer: Self-pay

## 2020-05-21 ENCOUNTER — Encounter: Payer: Self-pay | Admitting: Internal Medicine

## 2020-05-21 ENCOUNTER — Ambulatory Visit (INDEPENDENT_AMBULATORY_CARE_PROVIDER_SITE_OTHER): Payer: Medicare Other | Admitting: Internal Medicine

## 2020-05-21 VITALS — BP 159/93 | HR 82 | Wt 161.0 lb

## 2020-05-21 DIAGNOSIS — A630 Anogenital (venereal) warts: Secondary | ICD-10-CM

## 2020-05-21 DIAGNOSIS — A539 Syphilis, unspecified: Secondary | ICD-10-CM | POA: Diagnosis not present

## 2020-05-21 DIAGNOSIS — B20 Human immunodeficiency virus [HIV] disease: Secondary | ICD-10-CM | POA: Diagnosis not present

## 2020-05-21 DIAGNOSIS — Z23 Encounter for immunization: Secondary | ICD-10-CM

## 2020-05-21 MED ORDER — PENICILLIN G BENZATHINE 1200000 UNIT/2ML IM SUSP
1.2000 10*6.[IU] | Freq: Once | INTRAMUSCULAR | Status: AC
  Administered 2020-05-21: 1.2 10*6.[IU] via INTRAMUSCULAR

## 2020-05-21 MED ORDER — IMIQUIMOD 5 % EX CREA: TOPICAL_CREAM | CUTANEOUS | 0 refills | Status: DC

## 2020-05-21 NOTE — Assessment & Plan Note (Signed)
We will treat him for late latent syphilis.

## 2020-05-21 NOTE — Progress Notes (Signed)
Patient Active Problem List   Diagnosis Date Noted  . HIV disease (HCC) 06/20/2014    Priority: High  . Genital warts 05/21/2020  . Syphilis 05/21/2020  . Cervical strain 09/16/2018  . Assessment of effects of psychotropic drug in patient at risk for metabolic syndrome 09/16/2018  . Hypertension 10/05/2016  . MDD (major depressive disorder), recurrent severe, without psychosis (HCC) 02/25/2016  . Cannabis use disorder, severe, dependence (HCC) 02/25/2016  . Bipolar disorder (HCC) 06/21/2014  . Anxiety 06/21/2014  . Cigarette smoker 06/21/2014  . Dyslipidemia 06/20/2014    Patient's Medications  New Prescriptions   IMIQUIMOD (ALDARA) 5 % CREAM    Apply topically 3 (three) times a week.  Previous Medications   ABACAVIR-DOLUTEGRAVIR-LAMIVUDINE (TRIUMEQ) 600-50-300 MG TABLET    Take 1 tablet by mouth daily.   ACETAMINOPHEN (TYLENOL) 500 MG TABLET    Take 500 mg by mouth every 6 (six) hours as needed for mild pain.   AMLODIPINE (NORVASC) 5 MG TABLET    TAKE 1 TABLET BY MOUTH ONCE DAILY   AMOXICILLIN-CLAVULANATE (AUGMENTIN) 875-125 MG TABLET    Take 1 tablet by mouth every 12 (twelve) hours.   ATORVASTATIN (LIPITOR) 40 MG TABLET    Take 1 tablet (40 mg total) by mouth daily.   CETIRIZINE (ZYRTEC) 10 MG TABLET    Take 1 tablet (10 mg total) by mouth daily.   DARUNAVIR-COBICISTAT (PREZCOBIX) 800-150 MG TABLET    TAKE 1 TABLET BY MOUTH EVERY DAY WITH FOOD, SWALLOW WHOLE. DO NOT CRUSH, BREAK, OR CHEW   DOXEPIN (SINEQUAN) 10 MG CAPSULE    Take 10 mg by mouth at bedtime.   FLUOXETINE (PROZAC) 20 MG CAPSULE    Take 1 capsule (20 mg total) by mouth daily. For depression   IBUPROFEN (ADVIL) 800 MG TABLET    Take 1 tablet (800 mg total) by mouth 3 (three) times daily.   IPRATROPIUM (ATROVENT) 0.03 % NASAL SPRAY    Place 2 sprays into both nostrils every 12 (twelve) hours.   METHOCARBAMOL (ROBAXIN) 500 MG TABLET    Take 1 tablet (500 mg total) by mouth 2 (two) times daily.   NAPROXEN  (NAPROSYN) 500 MG TABLET    Take 1 tablet (500 mg total) by mouth 2 (two) times daily as needed for mild pain or moderate pain (neck).   OLANZAPINE (ZYPREXA) 15 MG TABLET    Take 1 tablet (15 mg total) by mouth at bedtime. For mood control   OLANZAPINE (ZYPREXA) 2.5 MG TABLET    Take 2.5 mg by mouth 2 (two) times daily. Morning and evening (evening dose with the 15mg  tablet=17.5mg )   PRAZOSIN (MINIPRESS) 2 MG CAPSULE    Take 2 mg by mouth at bedtime.  Modified Medications   No medications on file  Discontinued Medications   No medications on file    Subjective: Jeffery [name] is in for his routine HIV follow-up visit.  He denies any problems obtaining, taking or tolerating his Triumeq or Prezcobix.  He says that he has missed a few doses over the past month, usually when he was away from home without his medication.  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  He is starting to look for a new job.  He has not gotten a Covid vaccine yet.  He has had 2 male partners in the past year.  He has noticed 2 small bumps around his rectum over the past  few months.  They are nonpainful.  Review of Systems: Review of Systems  Constitutional: Negative for fever and weight loss.  Respiratory: Negative for cough.   Cardiovascular: Negative for chest pain.  Gastrointestinal: Negative for abdominal pain, diarrhea, nausea and vomiting.  Psychiatric/Behavioral: Negative for depression.    Past Medical History:  Diagnosis Date  . Anxiety   . Chlamydia   . Depression   . Drug induced constipation   . Hemorrhoids   . HIV infection (HCC)   . Hyperlipidemia   . Hypertension    Pt went off meds on his own; has been monitored without any problems  . Male-to-male transgender person    former history   . Nicotine dependence   . Syphilis 2013 and 2015    Social History   Tobacco Use  . Smoking status: Light Tobacco Smoker    Packs/day: 0.25    Types: Cigarettes  . Smokeless  tobacco: Never Used  . Tobacco comment: 5 cigarettes a day  Vaping Use  . Vaping Use: Never used  Substance Use Topics  . Alcohol use: Yes    Alcohol/week: 0.0 standard drinks    Comment: occasional  . Drug use: Yes    Frequency: 3.0 times per week    Types: Marijuana    Family History  Problem Relation Age of Onset  . Hypertension Father   . Hypertension Maternal Grandmother     Allergies  Allergen Reactions  . Bee Venom Swelling    Large local reactions  . Lisinopril Swelling    Facial and lip swelling    Health Maintenance  Topic Date Due  . COVID-19 Vaccine (1) Never done  . INFLUENZA VACCINE  02/04/2020  . TETANUS/TDAP  06/16/2026  . Hepatitis C Screening  Completed  . HIV Screening  Completed    Objective:  Vitals:   05/21/20 1446  BP: (!) 159/93  Pulse: 82  Weight: 161 lb (73 kg)   Body mass index is 25.99 kg/m.  Physical Exam Constitutional:      Comments: He is in good spirits as usual.  Cardiovascular:     Rate and Rhythm: Normal rate.  Pulmonary:     Effort: Pulmonary effort is normal.  Genitourinary:    Comments: He has 2 bumps, 1 clock position around his neck at the 7 o'clock position. Psychiatric:        Mood and Affect: Mood normal.     Lab Results Lab Results  Component Value Date   WBC 15.1 (H) 05/06/2020   HGB 16.2 05/06/2020   HCT 47.6 05/06/2020   MCV 95.6 05/06/2020   PLT 365 05/06/2020    Lab Results  Component Value Date   CREATININE 1.11 05/06/2020   BUN 8 05/06/2020   NA 137 05/06/2020   K 3.9 05/06/2020   CL 101 05/06/2020   CO2 24 05/06/2020    Lab Results  Component Value Date   ALT 12 05/06/2020   AST 15 05/06/2020   ALKPHOS 72 11/30/2017   BILITOT 0.6 05/06/2020    Lab Results  Component Value Date   CHOL 281 (H) 05/04/2019   HDL 39 (L) 05/04/2019   LDLCALC 210 (H) 05/04/2019   TRIG 152 (H) 05/04/2019   CHOLHDL 7.2 (H) 05/04/2019   Lab Results  Component Value Date   LABRPR REACTIVE (A)  05/06/2020   RPRTITER 1:16 (H) 05/06/2020   HIV 1 RNA Quant  Date Value  05/06/2020 <20 Copies/mL  05/04/2019 <20 NOT DETECTED copies/mL  04/19/2018 <20 NOT DETECTED copies/mL   CD4 T Cell Abs (/uL)  Date Value  05/06/2020 858  05/04/2019 1,340  04/19/2018 1,180     Problem List Items Addressed This Visit      High   HIV disease (HCC)    His infection remains under excellent, long-term control.  He will continue his current regimen.  We gave him a pill canister to keep a few doses of his medication with him at all times.  He understands the Covid vaccine series.  He will follow-up in 3 months.      Relevant Medications   imiquimod (ALDARA) 5 % cream (Start on 05/22/2020)     Unprioritized   Genital warts    I will treat him with imiquimod cream for probable genital warts.      Relevant Medications   imiquimod (ALDARA) 5 % cream (Start on 05/22/2020)   Syphilis    We will treat him for late latent syphilis.      Relevant Medications   imiquimod (ALDARA) 5 % cream (Start on 05/22/2020)        Cliffton Asters, MD [facility] [phone] pager   [phone] cell 05/21/2020, 3:14 PM

## 2020-05-21 NOTE — Assessment & Plan Note (Addendum)
I will treat him with imiquimod cream for probable genital warts.

## 2020-05-21 NOTE — Addendum Note (Signed)
Addended by: Rosanna Randy on: 05/21/2020 04:40 PM   Modules accepted: Orders

## 2020-05-21 NOTE — Assessment & Plan Note (Signed)
His infection remains under excellent, long-term control.  He will continue his current regimen.  We gave him a pill canister to keep a few doses of his medication with him at all times.  He understands the Covid vaccine series.  He will follow-up in 3 months.

## 2020-05-28 ENCOUNTER — Other Ambulatory Visit: Payer: Self-pay

## 2020-05-28 ENCOUNTER — Ambulatory Visit (INDEPENDENT_AMBULATORY_CARE_PROVIDER_SITE_OTHER): Payer: Medicare Other

## 2020-05-28 DIAGNOSIS — A539 Syphilis, unspecified: Secondary | ICD-10-CM

## 2020-05-28 MED ORDER — PENICILLIN G BENZATHINE 1200000 UNIT/2ML IM SUSP
1.2000 10*6.[IU] | Freq: Once | INTRAMUSCULAR | Status: AC
  Administered 2020-05-28: 1.2 10*6.[IU] via INTRAMUSCULAR

## 2020-05-28 NOTE — Progress Notes (Signed)
Patient in the office today for 2 of 3 bicillin injection. Patient verbalized tolerated first round of injections well. Patient accepted condoms and advised to abstain from any sexual contact post 10 days after final treatment.  Chaperone present during injection administration and patient tolerated well.  Patient reminded of upcoming appointment next week.  Jeffery [name]

## 2020-06-04 ENCOUNTER — Other Ambulatory Visit: Payer: Self-pay

## 2020-06-04 ENCOUNTER — Ambulatory Visit (INDEPENDENT_AMBULATORY_CARE_PROVIDER_SITE_OTHER): Payer: Medicare Other

## 2020-06-04 DIAGNOSIS — A539 Syphilis, unspecified: Secondary | ICD-10-CM

## 2020-06-04 MED ORDER — PENICILLIN G BENZATHINE 1200000 UNIT/2ML IM SUSP
1.2000 10*6.[IU] | Freq: Once | INTRAMUSCULAR | Status: AC
  Administered 2020-06-04: 1.2 10*6.[IU] via INTRAMUSCULAR

## 2020-06-11 DIAGNOSIS — F251 Schizoaffective disorder, depressive type: Secondary | ICD-10-CM | POA: Diagnosis not present

## 2020-06-11 DIAGNOSIS — F419 Anxiety disorder, unspecified: Secondary | ICD-10-CM | POA: Diagnosis not present

## 2020-06-11 DIAGNOSIS — F17210 Nicotine dependence, cigarettes, uncomplicated: Secondary | ICD-10-CM | POA: Diagnosis not present

## 2020-06-11 DIAGNOSIS — F4310 Post-traumatic stress disorder, unspecified: Secondary | ICD-10-CM | POA: Diagnosis not present

## 2020-07-12 ENCOUNTER — Other Ambulatory Visit: Payer: Self-pay | Admitting: Internal Medicine

## 2020-07-12 DIAGNOSIS — B20 Human immunodeficiency virus [HIV] disease: Secondary | ICD-10-CM

## 2020-08-20 ENCOUNTER — Ambulatory Visit: Payer: Medicare Other | Admitting: Internal Medicine

## 2020-12-09 ENCOUNTER — Other Ambulatory Visit: Payer: Self-pay

## 2020-12-09 ENCOUNTER — Ambulatory Visit (HOSPITAL_COMMUNITY)
Admission: EM | Admit: 2020-12-09 | Discharge: 2020-12-10 | Disposition: A | Discharge status: Other Acute Inpatient Hospital | Payer: Medicare Other | Attending: Nurse Practitioner | Admitting: Nurse Practitioner

## 2020-12-09 DIAGNOSIS — Z79899 Other long term (current) drug therapy: Secondary | ICD-10-CM | POA: Insufficient documentation

## 2020-12-09 DIAGNOSIS — F17210 Nicotine dependence, cigarettes, uncomplicated: Secondary | ICD-10-CM | POA: Insufficient documentation

## 2020-12-09 DIAGNOSIS — F251 Schizoaffective disorder, depressive type: Secondary | ICD-10-CM | POA: Insufficient documentation

## 2020-12-09 DIAGNOSIS — Z21 Asymptomatic human immunodeficiency virus [HIV] infection status: Secondary | ICD-10-CM | POA: Diagnosis not present

## 2020-12-09 DIAGNOSIS — F419 Anxiety disorder, unspecified: Secondary | ICD-10-CM | POA: Diagnosis not present

## 2020-12-09 DIAGNOSIS — F259 Schizoaffective disorder, unspecified: Secondary | ICD-10-CM | POA: Diagnosis present

## 2020-12-09 DIAGNOSIS — Z9151 Personal history of suicidal behavior: Secondary | ICD-10-CM | POA: Insufficient documentation

## 2020-12-09 DIAGNOSIS — Z20822 Contact with and (suspected) exposure to covid-19: Secondary | ICD-10-CM | POA: Insufficient documentation

## 2020-12-09 DIAGNOSIS — F1290 Cannabis use, unspecified, uncomplicated: Secondary | ICD-10-CM | POA: Insufficient documentation

## 2020-12-09 LAB — POCT URINE DRUG SCREEN - MANUAL ENTRY (I-SCREEN)
POC Amphetamine UR: NOT DETECTED
POC Buprenorphine (BUP): NOT DETECTED
POC Cocaine UR: POSITIVE — AB
POC Marijuana UR: NOT DETECTED
POC Methadone UR: NOT DETECTED
POC Methamphetamine UR: NOT DETECTED
POC Morphine: NOT DETECTED
POC Oxazepam (BZO): NOT DETECTED
POC Oxycodone UR: NOT DETECTED
POC Secobarbital (BAR): NOT DETECTED

## 2020-12-09 LAB — POC SARS CORONAVIRUS 2 AG: SARSCOV2ONAVIRUS 2 AG: NEGATIVE

## 2020-12-09 LAB — POC SARS CORONAVIRUS 2 AG -  ED: SARS Coronavirus 2 Ag: NEGATIVE

## 2020-12-09 MED ORDER — OLANZAPINE 10 MG PO TBDP
10.0000 mg | ORAL_TABLET | Freq: Every day | ORAL | Status: DC
  Administered 2020-12-09: 10 mg via ORAL
  Filled 2020-12-09: qty 1

## 2020-12-09 MED ORDER — TRAZODONE HCL 50 MG PO TABS
50.0000 mg | ORAL_TABLET | Freq: Every evening | ORAL | Status: DC | PRN
  Administered 2020-12-09: 50 mg via ORAL
  Filled 2020-12-09: qty 1

## 2020-12-09 MED ORDER — ACETAMINOPHEN 325 MG PO TABS: 650.0000 mg | ORAL_TABLET | Freq: Four times a day (QID) | ORAL | Status: DC | PRN

## 2020-12-09 MED ORDER — ALUM & MAG HYDROXIDE-SIMETH 200-200-20 MG/5ML PO SUSP: 30.0000 mL | ORAL | Status: DC | PRN

## 2020-12-09 MED ORDER — FLUOXETINE HCL 20 MG PO CAPS
20.0000 mg | ORAL_CAPSULE | Freq: Every day | ORAL | Status: DC
  Administered 2020-12-10: 20 mg via ORAL
  Filled 2020-12-09: qty 1

## 2020-12-09 MED ORDER — MAGNESIUM HYDROXIDE 400 MG/5ML PO SUSP: 30.0000 mL | Freq: Every day | ORAL | Status: DC | PRN

## 2020-12-09 MED ORDER — AMLODIPINE BESYLATE 5 MG PO TABS
5.0000 mg | ORAL_TABLET | Freq: Every day | ORAL | Status: DC
  Administered 2020-12-10: 5 mg via ORAL
  Filled 2020-12-09: qty 1

## 2020-12-09 MED ORDER — CLONIDINE HCL 0.1 MG PO TABS
0.1000 mg | ORAL_TABLET | Freq: Once | ORAL | Status: AC
  Administered 2020-12-09: 0.1 mg via ORAL
  Filled 2020-12-09: qty 1

## 2020-12-09 MED ORDER — HYDROXYZINE HCL 25 MG PO TABS: 25.0000 mg | ORAL_TABLET | Freq: Three times a day (TID) | ORAL | Status: DC | PRN

## 2020-12-09 NOTE — ED Notes (Signed)
Pt from home states his mother thought it would be best if he got evaluated tonight because he was not feeling himself. Pt denies HI or SI but does admit to hearing non command voices but denies receiving messages or see things that other aren't seeing

## 2020-12-09 NOTE — BH Assessment (Signed)
Patient presenting to [facility] voluntary, after being encouraged by mother, Jeffery [name] to come for psych evaluation. Patient gave consent for TTS clinician to speak with mother. When asked why are you here, patient reported "going through a lot, confused, a lot of stuff going on in my head. Patient is currently seeing Dr Belia Heman for medication management, however patient not taking medications. Patient reported worsening depressive symptoms. Patient reported hearing command voices "thoughts are confused". Patient denied SI, HI and psychosis.   Spoke with [name], mother, with consent from patient. [name] reported patient is pacing throughout home and that she wakes up to him staring at her and states patient is off his medication. Mother reported last time patient was off medication and exhibited similar behaviors, he was placed inpatient for mental health. [name] reported that due to safety concerns of patient hurting himself or her. Mother requesting inpatient for her son.    Urgent.

## 2020-12-09 NOTE — ED Provider Notes (Signed)
Behavioral Health Admission [facility]&P [facility] & OBS)  Date: 12/10/20 Patient Name: Jeffery [name] MRN: [id number] Chief Complaint:  Chief Complaint  Patient presents with  . Depression  . Anxiety      Diagnoses:  Final diagnoses:  Schizoaffective disorder, depressive type (HCC)    HPI: Jeffery [name] is 30 y.o. with a history of schizoaffective disorder, cannabis use disorder, HIV, and HTN who presents to [facility] voluntarily with this mother. On evaluation, patient is alert and oriented x 4. Speech is clear and coherent. Appears to have thought blocking. Provides minimal responses. Patient states "[name] been going through a lot. I just don't know what to do." He is unable to give examples of recent stressors. He repeats "I just don't know what to do" several times during the assessment. Patient states that he has been feeling more depressed over the last few weeks. States that he has not been taking his medication in several months. He denies suicidal ideations. However, he is unable to contract for safety outside of the facility. He reports a history of multiple suicide attempts. He cannot recall when he last attempted. He denies homicidal ideations. He reports that he hears voices at times. States that the voices are "scarambled." He denies visual hallucinations. He does not appear to be responding to internal stimuli. He denies paranoia. Denies thought insertion and thought broadcasting. No delusions illicited during this assessment. He states that he occasionally uses cocaine and alcohol. He was unable to elaborate on the quantity. Denies use of other substances, UDS positive for cocaine, otherwise negative. States that he is prescribed Zyprexa, but cannot recall the other medications. States that Dr. Lester Kinsman at [facility] is his psychiatric provider. On chart review it appears his last appointment Dr. Lester Kinsman was 06/11/2020. However, I am not able to view those records. At this time, there are no recent updates  to PTA medicines.   Patient was last inpatient at [facility] 02/2016. He was discharged on zyprexa 15 mg QHS and fluoxetine 20 mg daily.   Patient's mother, Jeffery [name], was present at [facility]. The patient provided verbal consent to talk with his mother. Ms. Vigen reports that the patient has been stating that he hears voices that tell him no to listen to her. She states that he paces constantly. She states that he has been standing in the doorway of her bedroom during the night, staring at her. She denies that he has been agressive or made any threatening comments. She states that she does not feel safe with him at home.    Patient is in agreement with continuous assessment for crisis stabilization and to restart medications.   PHQ 2-9:  Flowsheet Row Office Visit from 05/03/2018 in [facility] Office Visit from 04/28/2017 in [facility] Office Visit from 02/25/2017 in [facility]  Thoughts that you would be better off dead, or of hurting yourself in some way Several days Not at all Not at all  PHQ-9 Total Score 9 7 5       Flowsheet Row ED from 12/09/2020 in [facility]  C-SSRS RISK CATEGORY No Risk       Total Time spent with patient: 30 minutes  Musculoskeletal  Strength & Muscle Tone: within normal limits Gait & Station: normal Patient leans: N/A  Psychiatric Specialty Exam  Presentation General Appearance: Appropriate for Environment; Neat  Eye Contact:Fair  Speech:Clear and Coherent; Slow  Speech Volume:Decreased  Handedness:Right   Mood and Affect  Mood:Depressed; Hopeless  Affect:Blunt   Thought Process  Thought Processes:Coherent  Descriptions of Associations:Intact  Orientation:Full (Time, Place and Person)  Thought Content:Logical    Hallucinations:Hallucinations: Auditory  Ideas of Reference:None  Suicidal  Thoughts:Suicidal Thoughts: No  Homicidal Thoughts:Homicidal Thoughts: No   Sensorium  Memory:Immediate Fair; Recent Fair; Remote Fair  Judgment:Impaired  Insight:Present   Executive Functions  Concentration:Fair  Attention Span:Fair  Recall:Fair  Fund of Knowledge:Fair  Language:Fair   Psychomotor Activity  Psychomotor Activity:Psychomotor Activity: Normal   Assets  Assets:Desire for Improvement; Financial Resources/Insurance; Housing; Physical Health; Social Support   Sleep  Sleep:Sleep: Fair   Nutritional Assessment (For OBS and FBC admissions only) Has the patient had a weight loss or gain of 10 pounds or more in the last 3 months?: No Has the patient had a decrease in food intake/or appetite?: No Does the patient have dental problems?: No Does the patient have eating habits or behaviors that may be indicators of an eating disorder including binging or inducing vomiting?: No Has the patient recently lost weight without trying?: No Has the patient been eating poorly because of a decreased appetite?: No Malnutrition Screening Tool Score: 0    Physical Exam Constitutional:      General: He is not in acute distress.    Appearance: He is not ill-appearing, toxic-appearing or diaphoretic.  HENT:     Head: Normocephalic.     Right Ear: External ear normal.     Left Ear: External ear normal.  Eyes:     Pupils: Pupils are equal, round, and reactive to light.  Cardiovascular:     Rate and Rhythm: Normal rate.  Pulmonary:     Effort: Pulmonary effort is normal. No respiratory distress.  Musculoskeletal:        General: Normal range of motion.  Neurological:     Mental Status: He is alert and oriented to person, place, and time.  Psychiatric:        Mood and Affect: Mood is anxious and depressed.        Behavior: Behavior is cooperative.        Thought Content: Thought content does not include homicidal or suicidal ideation. Thought content does not  include suicidal plan.    Review of Systems  Constitutional: Negative for chills, diaphoresis, fever, malaise/fatigue and weight loss.  HENT: Negative for congestion.   Respiratory: Negative for cough and shortness of breath.   Cardiovascular: Negative for chest pain and palpitations.  Gastrointestinal: Negative for diarrhea, nausea and vomiting.  Neurological: Negative for dizziness and seizures.  Psychiatric/Behavioral: Positive for depression, hallucinations, substance abuse and suicidal ideas. Negative for memory loss. The patient is nervous/anxious and has insomnia.   All other systems reviewed and are negative.   Blood pressure (!) 167/110, pulse 89, temperature 98.6 F (37 C), temperature source Oral, resp. rate 18, SpO2 100 %. There is no height or weight on file to calculate BMI.  Past Psychiatric History: Schizoaffective disorder, depressive type. MDD, cannabis dependence.   Is the patient at risk to self? Yes  Has the patient been a risk to self in the past 6 months? No .    Has the patient been a risk to self within the distant past? Yes   Is the patient a risk to others? No   Has the patient been a risk to others in the past 6 months? No   Has the patient been a risk to others within the distant  past? No   Past Medical History:  Past Medical History:  Diagnosis Date  . Anxiety   . Chlamydia   . Depression   . Drug induced constipation   . Hemorrhoids   . HIV infection (HCC)   . Hyperlipidemia   . Hypertension    Pt went off meds on his own; has been monitored without any problems  . Male-to-male transgender person    former history   . Nicotine dependence   . Syphilis 2013 and 2015   No past surgical history on file.  Family History:  Family History  Problem Relation Age of Onset  . Hypertension Father   . Hypertension Maternal Grandmother     Social History:  Social History   Socioeconomic History  . Marital status: Single    Spouse name: Not on  file  . Number of children: Not on file  . Years of education: Not on file  . Highest education level: Not on file  Occupational History  . Occupation: restraunt    Employer: [employer]  Tobacco Use  . Smoking status: Light Tobacco Smoker    Packs/day: 0.25    Types: Cigarettes  . Smokeless tobacco: Never Used  . Tobacco comment: 5 cigarettes a day  Vaping Use  . Vaping Use: Never used  Substance and Sexual Activity  . Alcohol use: Yes    Alcohol/week: 0.0 standard drinks    Comment: occasional  . Drug use: Yes    Frequency: 3.0 times per week    Types: Marijuana  . Sexual activity: Not Currently    Partners: Male    Comment: given condoms  Other Topics Concern  . Not on file  Social History Narrative  . Not on file   Social Determinants of Health   Financial Resource Strain: Not on file  Food Insecurity: Not on file  Transportation Needs: Not on file  Physical Activity: Not on file  Stress: Not on file  Social Connections: Not on file  Intimate Partner Violence: Not on file    SDOH:  SDOH Screenings   Alcohol Screen: Not on file  Depression (PHQ2-9): Low Risk   . PHQ-2 Score: 1  Financial Resource Strain: Not on file  Food Insecurity: Not on file  Housing: Not on file  Physical Activity: Not on file  Social Connections: Not on file  Stress: Not on file  Tobacco Use: High Risk  . Smoking Tobacco Use: Light Tobacco Smoker  . Smokeless Tobacco Use: Never Used  Transportation Needs: Not on file    Last Labs:  Admission on 12/09/2020  Component Date Value Ref Range Status  . SARS Coronavirus 2 by RT PCR 12/09/2020 NEGATIVE  NEGATIVE Final   Comment: (NOTE) SARS-CoV-2 target nucleic acids are NOT DETECTED.  The SARS-CoV-2 RNA is generally detectable in upper respiratory specimens during the acute phase of infection. The lowest concentration of SARS-CoV-2 viral copies this assay can detect is 138 copies/mL. A negative result does not preclude  SARS-Cov-2 infection and should not be used as the sole basis for treatment or other patient management decisions. A negative result may occur with  improper specimen collection/handling, submission of specimen other than nasopharyngeal swab, presence of viral mutation(s) within the areas targeted by this assay, and inadequate number of viral copies(<138 copies/mL). A negative result must be combined with clinical observations, patient history, and epidemiological information. The expected result is Negative.  Fact Sheet for Patients:  BloggerCourse.com  Fact Sheet for Healthcare Providers:  SeriousBroker.it  This test is no                          t yet approved or cleared by the [place] and  has been authorized for detection and/or diagnosis of SARS-CoV-2 by FDA under an Emergency Use Authorization (EUA). This EUA will remain  in effect (meaning this test can be used) for the duration of the COVID-19 declaration under Section 564(b)(1) of the Act, 21 U.S.C.section 360bbb-3(b)(1), unless the authorization is terminated  or revoked sooner.      . Influenza A by PCR 12/09/2020 NEGATIVE  NEGATIVE Final  . Influenza B by PCR 12/09/2020 NEGATIVE  NEGATIVE Final   Comment: (NOTE) The Xpert Xpress SARS-CoV-2/FLU/RSV plus assay is intended as an aid in the diagnosis of influenza from Nasopharyngeal swab specimens and should not be used as a sole basis for treatment. Nasal washings and aspirates are unacceptable for Xpert Xpress SARS-CoV-2/FLU/RSV testing.  Fact Sheet for Patients: BloggerCourse.com  Fact Sheet for Healthcare Providers: SeriousBroker.it  This test is not yet approved or cleared by the [place] FDA and has been authorized for detection and/or diagnosis of SARS-CoV-2 by FDA under an Emergency Use Authorization (EUA). This EUA will remain in effect  (meaning this test can be used) for the duration of the COVID-19 declaration under Section 564(b)(1) of the Act, 21 U.S.C. section 360bbb-3(b)(1), unless the authorization is terminated or revoked.  Performed at [facility], [address] [place], [place] [address]   . SARS Coronavirus 2 Ag 12/09/2020 Negative  Negative Preliminary  . WBC 12/09/2020 19.2* 4.0 - 10.5 K/uL Final  . RBC 12/09/2020 5.30  4.22 - 5.81 MIL/uL Final  . Hemoglobin 12/09/2020 16.8  13.0 - 17.0 g/dL Final  . HCT 68/06/7516 49.5  39.0 - 52.0 % Final  . MCV 12/09/2020 93.4  80.0 - 100.0 fL Final  . MCH 12/09/2020 31.7  26.0 - 34.0 pg Final  . MCHC 12/09/2020 33.9  30.0 - 36.0 g/dL Final  . RDW 00/17/4944 13.2  11.5 - 15.5 % Final  . Platelets 12/09/2020 345  150 - 400 K/uL Final  . nRBC 12/09/2020 0.0  0.0 - 0.2 % Final  . Neutrophils Relative % 12/09/2020 62  % Final  . Neutro Abs 12/09/2020 12.0* 1.7 - 7.7 K/uL Final  . Lymphocytes Relative 12/09/2020 26  % Final  . Lymphs Abs 12/09/2020 5.0* 0.7 - 4.0 K/uL Final  . Monocytes Relative 12/09/2020 8  % Final  . Monocytes Absolute 12/09/2020 1.5* 0.1 - 1.0 K/uL Final  . Eosinophils Relative 12/09/2020 3  % Final  . Eosinophils Absolute 12/09/2020 0.6* 0.0 - 0.5 K/uL Final  . Basophils Relative 12/09/2020 1  % Final  . Basophils Absolute 12/09/2020 0.1  0.0 - 0.1 K/uL Final  . Immature Granulocytes 12/09/2020 0  % Final  . Abs Immature Granulocytes 12/09/2020 0.07  0.00 - 0.07 K/uL Final   Performed at [facility], [address] [place], [place] [address]  . POC Amphetamine UR 12/09/2020 None Detected  NONE DETECTED (Cut Off Level 1000 ng/mL) Final  . POC Secobarbital (BAR) 12/09/2020 None Detected  NONE DETECTED (Cut Off Level 300 ng/mL) Final  . POC Buprenorphine (BUP) 12/09/2020 None Detected  NONE DETECTED (Cut Off Level 10 ng/mL) Final  . POC Oxazepam (BZO) 12/09/2020 None Detected  NONE DETECTED (Cut Off Level 300 ng/mL) Final  . POC  Cocaine UR 12/09/2020 Positive*  NONE DETECTED (Cut Off Level 300 ng/mL) Final  . POC Methamphetamine UR 12/09/2020 None Detected  NONE DETECTED (Cut Off Level 1000 ng/mL) Final  . POC Morphine 12/09/2020 None Detected  NONE DETECTED (Cut Off Level 300 ng/mL) Final  . POC Oxycodone UR 12/09/2020 None Detected  NONE DETECTED (Cut Off Level 100 ng/mL) Final  . POC Methadone UR 12/09/2020 None Detected  NONE DETECTED (Cut Off Level 300 ng/mL) Final  . POC Marijuana UR 12/09/2020 None Detected  NONE DETECTED (Cut Off Level 50 ng/mL) Final  . SARSCOV2ONAVIRUS 2 AG 12/09/2020 NEGATIVE  NEGATIVE Final   Comment: (NOTE) SARS-CoV-2 antigen NOT DETECTED.   Negative results are presumptive.  Negative results do not preclude SARS-CoV-2 infection and should not be used as the sole basis for treatment or other patient management decisions, including infection  control decisions, particularly in the presence of clinical signs and  symptoms consistent with COVID-19, or in those who have been in contact with the virus.  Negative results must be combined with clinical observations, patient history, and epidemiological information. The expected result is Negative.  Fact Sheet for Patients: https://www.jennings-kim.com/  Fact Sheet for Healthcare Providers: https://alexander-rogers.biz/  This test is not yet approved or cleared by the [place] FDA and  has been authorized for detection and/or diagnosis of SARS-CoV-2 by FDA under an Emergency Use Authorization (EUA).  This EUA will remain in effect (meaning this test can be used) for the duration of  the COV                          ID-19 declaration under Section 564(b)(1) of the Act, 21 U.S.C. section 360bbb-3(b)(1), unless the authorization is terminated or revoked sooner.      Allergies: Bee venom and Lisinopril  PTA Medications: (Not in a hospital admission)   Medical Decision Making  Admission orders  placed  Restart zyprexa 10 mg ODT QHS for schizoaffective disorder Restart fluoxetine 20 mg daily for depression Restart Norvasc 5 mg daily for HTN  Hydroxyzine 25 mg TID prn for anxiety Trazodone 50 mg QHS prn for sleep  Give clonidine 0.1 mg x 1 dose for HTN   Clinical Course as of 12/10/20 0204  Tue Dec 10, 2020  0125 POC Cocaine UR(!): Positive UDS positive for cocaine, otherwise.  [JB]  0126 WBC(!): 19.2 WBC 19.2, patient denies cough, shortness of breath, congestion, N/V/D, and recent illness.  [JB]  0203 TSH: 2.121 [JB]  0203 Potassium(!): 3.2 Potassium 3.2, replace with oral potassium 40 mEq [JB]  0203 Alcohol, Ethyl (B): <10 [JB]  0203 Magnesium: 1.9 [JB]    Clinical Course User Index [JB] Jackelyn Poling, NP    Recommendations  Based on my evaluation the patient does not appear to have an emergency medical condition.  Jackelyn Poling, NP 12/10/20  1:31 AM

## 2020-12-10 DIAGNOSIS — F251 Schizoaffective disorder, depressive type: Secondary | ICD-10-CM | POA: Diagnosis not present

## 2020-12-10 LAB — ETHANOL: Alcohol, Ethyl (B): <10 mg/dL (ref <10)

## 2020-12-10 LAB — CBC WITH DIFFERENTIAL/PLATELET
Abs Immature Granulocytes: 0.07 10*3/uL (ref 0.00–0.07)
Basophils Absolute: 0.1 10*3/uL (ref 0.0–0.1)
Basophils Relative: 1 %
Eosinophils Absolute: 0.6 10*3/uL — ABNORMAL HIGH (ref 0.0–0.5)
Eosinophils Relative: 3 %
HCT: 49.5 % (ref 39.0–52.0)
Hemoglobin: 16.8 g/dL (ref 13.0–17.0)
Immature Granulocytes: 0 %
Lymphocytes Relative: 26 %
Lymphs Abs: 5 10*3/uL — ABNORMAL HIGH (ref 0.7–4.0)
MCH: 31.7 pg (ref 26.0–34.0)
MCHC: 33.9 g/dL (ref 30.0–36.0)
MCV: 93.4 fL (ref 80.0–100.0)
Monocytes Absolute: 1.5 10*3/uL — ABNORMAL HIGH (ref 0.1–1.0)
Monocytes Relative: 8 %
Neutro Abs: 12 10*3/uL — ABNORMAL HIGH (ref 1.7–7.7)
Neutrophils Relative %: 62 %
Platelets: 345 10*3/uL (ref 150–400)
RBC: 5.3 MIL/uL (ref 4.22–5.81)
RDW: 13.2 % (ref 11.5–15.5)
WBC: 19.2 10*3/uL — ABNORMAL HIGH (ref 4.0–10.5)
nRBC: 0 % (ref 0.0–0.2)

## 2020-12-10 LAB — RESP PANEL BY RT-PCR (FLU A&B, COVID) ARPGX2
Influenza A by PCR: NEGATIVE
Influenza B by PCR: NEGATIVE
SARS Coronavirus 2 by RT PCR: NEGATIVE

## 2020-12-10 LAB — LIPID PANEL
Cholesterol: 289 mg/dL — ABNORMAL HIGH (ref 0–200)
HDL: 40 mg/dL — ABNORMAL LOW (ref >40)
LDL Cholesterol: 217 mg/dL — ABNORMAL HIGH (ref 0–99)
Total CHOL/HDL Ratio: 7.2 RATIO
Triglycerides: 158 mg/dL — ABNORMAL HIGH (ref <150)
VLDL: 32 mg/dL (ref 0–40)

## 2020-12-10 LAB — COMPREHENSIVE METABOLIC PANEL
ALT: 15 U/L (ref 0–44)
AST: 17 U/L (ref 15–41)
Albumin: 3.9 g/dL (ref 3.5–5.0)
Alkaline Phosphatase: 90 U/L (ref 38–126)
Anion gap: 11 (ref 5–15)
BUN: 7 mg/dL (ref 6–20)
CO2: 23 mmol/L (ref 22–32)
Calcium: 9.3 mg/dL (ref 8.9–10.3)
Chloride: 99 mmol/L (ref 98–111)
Creatinine, Ser: 0.92 mg/dL (ref 0.61–1.24)
GFR, Estimated: >60 mL/min (ref >60)
Glucose, Bld: 83 mg/dL (ref 70–99)
Potassium: 3.2 mmol/L — ABNORMAL LOW (ref 3.5–5.1)
Sodium: 133 mmol/L — ABNORMAL LOW (ref 135–145)
Total Bilirubin: 0.7 mg/dL (ref 0.3–1.2)
Total Protein: 8 g/dL (ref 6.5–8.1)

## 2020-12-10 LAB — TSH: TSH: 2.121 u[IU]/mL (ref 0.350–4.500)

## 2020-12-10 LAB — MAGNESIUM: Magnesium: 1.9 mg/dL (ref 1.7–2.4)

## 2020-12-10 MED ORDER — FLUOXETINE HCL 20 MG PO CAPS: 20.0000 mg | ORAL_CAPSULE | Freq: Every day | ORAL | 0 refills | Status: DC

## 2020-12-10 MED ORDER — OLANZAPINE 10 MG PO TABS: 10.0000 mg | ORAL_TABLET | Freq: Every day | ORAL | 0 refills | Status: DC

## 2020-12-10 MED ORDER — POTASSIUM CHLORIDE CRYS ER 20 MEQ PO TBCR
40.0000 meq | EXTENDED_RELEASE_TABLET | Freq: Once | ORAL | Status: AC
  Administered 2020-12-10: 40 meq via ORAL
  Filled 2020-12-10: qty 2

## 2020-12-10 NOTE — ED Notes (Signed)
Remains asleep no distress noted. ?

## 2020-12-10 NOTE — Progress Notes (Signed)
Report was called to nurse Renea Ee at [facility], Safe Transport was called. He was transported at 1242 hrs with his AVS, prescriptions and personal belongings.

## 2020-12-10 NOTE — ED Provider Notes (Signed)
FBC/OBS ASAP Discharge Summary  Date and Time: 12/10/2020 11:26 AM  Name: Jeffery [name]  MRN:  [id number]   Discharge Diagnoses:  Final diagnoses:  Schizoaffective disorder, depressive type (HCC)    Subjective: Patient reports that he is doing ok today. He states that he is still having auditory Hallucinations- it is background voices that he can only occasionally make out what is being said. He reports no SI, HI, or VH. He reports that he has tolerated restarting his medications with no side effects. He reports that he slept ok last night and that his appetite has been ok. He has no other concerns at present.   Stay Summary: Patient presented to [facility] on [facility] with his mother due to hearing voices that tell him no one listens to him. He was admitted overnight for stabilization and to look for an inpatient bed placement. On 6/7 he reported doing better with the restarting of his medications and he was accepted to [facility] and discharged.  Total Time spent with patient: 20 minutes  Past Psychiatric History: Schizoaffective disorder, depressive type. MDD, cannabis dependence. Past Medical History:  Past Medical History:  Diagnosis Date  . Anxiety   . Chlamydia   . Depression   . Drug induced constipation   . Hemorrhoids   . HIV infection (HCC)   . Hyperlipidemia   . Hypertension    Pt went off meds on his own; has been monitored without any problems  . Male-to-male transgender person    former history   . Nicotine dependence   . Syphilis 2013 and 2015   No past surgical history on file. Family History:  Family History  Problem Relation Age of Onset  . Hypertension Father   . Hypertension Maternal Grandmother    Family Psychiatric History: None Social History:  Social History   Substance and Sexual Activity  Alcohol Use Yes  . Alcohol/week: 0.0 standard drinks   Comment: occasional     Social History   Substance and Sexual Activity  Drug Use Yes  . Frequency:  3.0 times per week  . Types: Marijuana    Social History   Socioeconomic History  . Marital status: Single    Spouse name: Not on file  . Number of children: Not on file  . Years of education: Not on file  . Highest education level: Not on file  Occupational History  . Occupation: restraunt    Employer: [employer]  Tobacco Use  . Smoking status: Light Tobacco Smoker    Packs/day: 0.25    Types: Cigarettes  . Smokeless tobacco: Never Used  . Tobacco comment: 5 cigarettes a day  Vaping Use  . Vaping Use: Never used  Substance and Sexual Activity  . Alcohol use: Yes    Alcohol/week: 0.0 standard drinks    Comment: occasional  . Drug use: Yes    Frequency: 3.0 times per week    Types: Marijuana  . Sexual activity: Not Currently    Partners: Male    Comment: given condoms  Other Topics Concern  . Not on file  Social History Narrative  . Not on file   Social Determinants of Health   Financial Resource Strain: Not on file  Food Insecurity: Not on file  Transportation Needs: Not on file  Physical Activity: Not on file  Stress: Not on file  Social Connections: Not on file   SDOH:  SDOH Screenings   Alcohol Screen: Not on file  Depression (PHQ2-9): Low Risk   .  PHQ-2 Score: 1  Financial Resource Strain: Not on file  Food Insecurity: Not on file  Housing: Not on file  Physical Activity: Not on file  Social Connections: Not on file  Stress: Not on file  Tobacco Use: High Risk  . Smoking Tobacco Use: Light Tobacco Smoker  . Smokeless Tobacco Use: Never Used  Transportation Needs: Not on file    Has this patient used any form of tobacco in the last 30 days? (Cigarettes, Smokeless Tobacco, Cigars, and/or Pipes) A prescription for an FDA-approved tobacco cessation medication was offered at discharge and the patient refused  Current Medications:  Current Facility-Administered Medications  Medication Dose Route Frequency Provider Last Rate Last Admin  . acetaminophen  (TYLENOL) tablet 650 mg  650 mg Oral Q6H PRN Nira Conn A, NP      . alum & mag hydroxide-simeth (MAALOX/MYLANTA) 200-200-20 MG/5ML suspension 30 mL  30 mL Oral Q4H PRN Nira Conn A, NP      . amLODipine (NORVASC) tablet 5 mg  5 mg Oral Daily Nira Conn A, NP   5 mg at 12/10/20 0934  . FLUoxetine (PROZAC) capsule 20 mg  20 mg Oral Daily Nira Conn A, NP   20 mg at 12/10/20 4098  . hydrOXYzine (ATARAX/VISTARIL) tablet 25 mg  25 mg Oral TID PRN Nira Conn A, NP      . magnesium hydroxide (MILK OF MAGNESIA) suspension 30 mL  30 mL Oral Daily PRN Nira Conn A, NP      . OLANZapine zydis (ZYPREXA) disintegrating tablet 10 mg  10 mg Oral QHS Nira Conn A, NP   10 mg at 12/09/20 2328  . traZODone (DESYREL) tablet 50 mg  50 mg Oral QHS PRN Jackelyn Poling, NP   50 mg at 12/09/20 2328   Current Outpatient Medications  Medication Sig Dispense Refill  . OLANZapine (ZYPREXA) 10 MG tablet Take 1 tablet (10 mg total) by mouth at bedtime. 30 tablet 0  . acetaminophen (TYLENOL) 500 MG tablet Take 500 mg by mouth every 6 (six) hours as needed for mild pain.    Marland Kitchen amLODipine (NORVASC) 5 MG tablet TAKE 1 TABLET BY MOUTH ONCE DAILY (Patient not taking: Reported on 05/21/2020) 30 tablet 3  . atorvastatin (LIPITOR) 40 MG tablet Take 1 tablet (40 mg total) by mouth daily. (Patient not taking: Reported on 05/21/2020) 90 tablet 3  . [START ON 12/11/2020] FLUoxetine (PROZAC) 20 MG capsule Take 1 capsule (20 mg total) by mouth daily. 30 capsule 0  . imiquimod (ALDARA) 5 % cream Apply topically 3 (three) times a week. 12 each 0  . prazosin (MINIPRESS) 2 MG capsule Take 2 mg by mouth at bedtime.    Marland Kitchen PREZCOBIX 800-150 MG tablet TAKE 1 TABLET BY MOUTH EVERY DAY WITH FOOD. SWALLOW WHOLE. DO NOT CRUSH, BREAK, OR CHEW 30 tablet 2  . TRIUMEQ 600-50-300 MG tablet TAKE 1 TABLET BY MOUTH DAILY 30 tablet 2    PTA Medications: (Not in a hospital admission)   Musculoskeletal  Strength & Muscle Tone: within normal  limits Gait & Station: normal Patient leans: N/A  Psychiatric Specialty Exam  Presentation  General Appearance: Appropriate for Environment; Neat  Eye Contact:Fair  Speech:Clear and Coherent; Slow  Speech Volume:Decreased  Handedness:Right   Mood and Affect  Mood:Depressed; Hopeless  Affect:Blunt   Thought Process  Thought Processes:Coherent  Descriptions of Associations:Intact  Orientation:Full (Time, Place and Person)  Thought Content:Logical   Duration of Psychotic Symptoms: Less than six months  Hallucinations:Hallucinations: Auditory  Ideas of Reference:None  Suicidal Thoughts:Suicidal Thoughts: No  Homicidal Thoughts:Homicidal Thoughts: No   Sensorium  Memory:Immediate Fair; Recent Fair; Remote Fair  Judgment:Impaired  Insight:Present   Executive Functions  Concentration:Fair  Attention Span:Fair  Recall:Fair  Fund of Knowledge:Fair  Language:Fair   Psychomotor Activity  Psychomotor Activity:Psychomotor Activity: Normal   Assets  Assets:Desire for Improvement; Financial Resources/Insurance; Housing; Physical Health; Social Support   Sleep  Sleep:Sleep: Fair   Nutritional Assessment (For OBS and FBC admissions only) Has the patient had a weight loss or gain of 10 pounds or more in the last 3 months?: No Has the patient had a decrease in food intake/or appetite?: No Does the patient have dental problems?: No Does the patient have eating habits or behaviors that may be indicators of an eating disorder including binging or inducing vomiting?: No Has the patient recently lost weight without trying?: No Has the patient been eating poorly because of a decreased appetite?: No Malnutrition Screening Tool Score: 0    Physical Exam  Physical Exam Vitals and nursing note reviewed.  Constitutional:      General: He is not in acute distress.    Appearance: Normal appearance. He is normal weight. He is not ill-appearing or  toxic-appearing.  HENT:     Head: Normocephalic and atraumatic.  Cardiovascular:     Rate and Rhythm: Normal rate.  Pulmonary:     Effort: Pulmonary effort is normal.  Musculoskeletal:        General: Normal range of motion.  Neurological:     Mental Status: He is alert.    Review of Systems  Constitutional: Negative for chills.  Respiratory: Negative for cough and shortness of breath.   Cardiovascular: Negative for chest pain.  Gastrointestinal: Negative for abdominal pain, constipation, diarrhea, nausea and vomiting.  Neurological: Negative for weakness and headaches.  Psychiatric/Behavioral: Positive for hallucinations. Negative for depression and suicidal ideas.   Blood pressure 117/68, pulse 62, temperature 97.7 F (36.5 C), temperature source Tympanic, resp. rate 16, SpO2 99 %. There is no height or weight on file to calculate BMI.  Demographic Factors:  Male  Loss Factors: NA  Historical Factors: Prior suicide attempts  Risk Reduction Factors:   Living with another person, especially a relative and Positive social support  Continued Clinical Symptoms:  Schizophrenia:   Less than 49 years old  Cognitive Features That Contribute To Risk:  None    Suicide Risk:  Mild:  Suicidal ideation of limited frequency, intensity, duration, and specificity.  There are no identifiable plans, no associated intent, mild dysphoria and related symptoms, good self-control (both objective and subjective assessment), few other risk factors, and identifiable protective factors, including available and accessible social support.  Plan Of Care/Follow-up recommendations:  - Activity as tolerated. - Diet as recommended by PCP. - Keep all scheduled follow-up appointments as recommended.  Disposition: Discharge for Readmit to [facility]  Lauro Franklin, MD 12/10/2020, 11:26 AM

## 2020-12-10 NOTE — Progress Notes (Signed)
Received [name] this AM asleep in his chair bed, he was awaken for VS assessment and drifted back off to sleep. [facility] called in reference to acceptance. Pharmacy was notified for a seven day supply of his HIV medication.

## 2020-12-10 NOTE — Progress Notes (Signed)
Patient information has been sent to [facility] via secure chat to review for potential admission. Patient meets inpatient criteria per Rhea Belton, MD.  Situation ongoing, CSW will continue to monitor progress.    Signed:  Damita Dunnings, MSW, LCSW-A  12/10/2020 9:46 AM

## 2020-12-10 NOTE — BH Assessment (Signed)
Comprehensive Clinical Assessment (CCA) Note  12/10/2020 Jeffery [name] [id number]   Disposition: Nira Conn, NP, recommends overnight observation for safety and stabilization with psych reassessment in the AM. Patient will be admitted to [facility] unit.   The patient demonstrates the following risk factors for suicide: Chronic risk factors for suicide include: psychiatric disorder of schizoaffective . Acute risk factors for suicide include: family or marital conflict. Protective factors for this patient include: positive therapeutic relationship. Considering these factors, the overall suicide risk at this point appears to be moderate. Patient is not appropriate for outpatient follow up.  Flowsheet Row ED from 12/09/2020 in [facility]  C-SSRS RISK CATEGORY No Risk    1:1 risk  Patient presenting to [facility] voluntary, after being encouraged by mother, Jeffery [name] to come for psych evaluation. Patient gave consent for TTS clinician to speak with mother. When asked why are you here, patient reported "going through a lot, confused, a lot of stuff going on in my head. Patient appears to be thought blocking. Patient repeats "I don't know what to do" throughout assessment. Patient is currently seeing Dr Belia Heman for medication management, however patient not taking medications. Patient reported worsening depressive symptoms. Patient reported hearing command voices "thoughts are confused". Patient is unable to contract for safety. Patient denied SI, HI and psychosis.   Spoke with Jeffery [name], mother, with consent from patient. [name] reported patient is pacing throughout home and that she wakes up to him staring at her and states patient is off his medication. Mother reported last time patient was off medication and exhibited similar behaviors, he was placed inpatient for mental health. [name] reported that due to safety concerns of patient hurting himself or her. Mother requesting  inpatient for her son.    Chief Complaint:  Chief Complaint  Patient presents with  . Depression  . Anxiety   Visit Diagnosis: Schizoaffective disorder, depressive type  CCA Biopsychosocial Intake/Chief Complaint:  psych evaluation  Current Symptoms/Problems: Worsening depressive symptoms.  Patient Reported Schizophrenia/Schizoaffective Diagnosis in Past: No data recorded  Strengths: Self-awareness  Preferences: No data recorded Abilities: No data recorded  Type of Services Patient Feels are Needed: treatment  Initial Clinical Notes/Concerns: No data recorded  Mental Health Symptoms Depression:  Worthlessness; Change in energy/activity; Difficulty Concentrating; Fatigue; Hopelessness; Tearfulness; Sleep (too much or little); Irritability; Increase/decrease in appetite   Duration of Depressive symptoms: Greater than two weeks   Mania:  None   Anxiety:   None   Psychosis:  Hallucinations   Duration of Psychotic symptoms: Less than six months   Trauma:  None   Obsessions:  None   Compulsions:  None   Inattention:  None   Hyperactivity/Impulsivity:  N/A   Oppositional/Defiant Behaviors:  None   Emotional Irregularity:  None   Other Mood/Personality Symptoms:  No data recorded   Mental Status Exam Appearance and self-care  Stature:  Average   Weight:  Average weight   Clothing:  Age-appropriate   Grooming:  Normal   Cosmetic use:  None   Posture/gait:  Normal   Motor activity:  Not Remarkable   Sensorium  Attention:  Normal   Concentration:  Preoccupied   Orientation:  X5   Recall/memory:  No data recorded  Affect and Mood  Affect:  Anxious   Mood:  Depressed   Relating  Eye contact:  Normal   Facial expression:  Sad; Tense   Attitude toward examiner:  Cooperative   Thought and Language  Speech flow: Clear  and Coherent   Thought content:  Appropriate to Mood and Circumstances   Preoccupation:  None   Hallucinations:   Auditory   Organization:  No data recorded  [employer] of Knowledge:  Average   Intelligence:  Average   Abstraction:  Normal   Judgement:  Impaired   Reality Testing:  Distorted   Insight:  No data recorded  Decision Making:  Confused   Social Functioning  Social Maturity:  No data recorded  Social Judgement:  No data recorded  Stress  Stressors:  Other (Comment) (mental health)   Coping Ability:  Exhausted; Overwhelmed   Skill Deficits:  Self-control; Decision making   Supports:  Family    Religion:   Leisure/Recreation: Leisure / Recreation Do You Have Hobbies?: Yes Leisure and Hobbies: watching television  Exercise/Diet: Exercise/Diet Do You Have Any Trouble Sleeping?: Yes Explanation of Sleeping Difficulties: "no sleep"  CCA Employment/Education Employment/Work Situation: Employment / Work Situation Employment situation: Unemployed Has patient ever been in the [employer]?: No  Education: Education Is Patient Currently Attending School?: No Last Grade Completed: 12 Did Garment/textile technologist From [employer]?: Yes Did Theme park manager?: No Did Designer, television/film set?: No  CCA Family/Childhood History Family and Relationship History: Family history Does patient have children?: No  Childhood History:  Childhood History By whom was/is the patient raised?: Mother Description of patient's relationship with caregiver when they were a child: uta Patient's description of current relationship with people who raised him/her: uta How were you disciplined when you got in trouble as a child/adolescent?: uta Does patient have siblings?:  (uta) Did patient suffer any verbal/emotional/physical/sexual abuse as a child?:  (uta) Did patient suffer from severe childhood neglect?:  (uta) Has patient ever been sexually abused/assaulted/raped as an adolescent or adult?:  (uta) Was the patient ever a victim of a crime or a disaster?:  (uta) Witnessed  domestic violence?:  (uta) Has patient been affected by domestic violence as an adult?:  Industrial/product designer)  Child/Adolescent Assessment:   CCA Substance Use Alcohol/Drug Use: Alcohol / Drug Use Pain Medications: see MAR Prescriptions: see MAR Over the Counter: see MAR History of alcohol / drug use?: Yes Substance #1 Name of Substance 1: cocaine 1 - Amount (size/oz): uta 1 - Frequency: "occassionaly" 1 - Last Use / Amount: uta 1- Route of Use: uta   ASAM's:  Six Dimensions of Multidimensional Assessment  Dimension 1:  Acute Intoxication and/or Withdrawal Potential:      Dimension 2:  Biomedical Conditions and Complications:      Dimension 3:  Emotional, Behavioral, or Cognitive Conditions and Complications:     Dimension 4:  Readiness to Change:     Dimension 5:  Relapse, Continued use, or Continued Problem Potential:     Dimension 6:  Recovery/Living Environment:     ASAM Severity Score:    ASAM Recommended Level of Treatment:     Substance use Disorder (SUD)   Recommendations for Services/Supports/Treatments: Recommendations for Services/Supports/Treatments Recommendations For Services/Supports/Treatments: Individual Therapy,Medication Management  DSM5 Diagnoses: Patient Active Problem List   Diagnosis Date Noted  . Genital warts 05/21/2020  . Syphilis 05/21/2020  . Cervical strain 09/16/2018  . Assessment of effects of psychotropic drug in patient at risk for metabolic syndrome 09/16/2018  . Hypertension 10/05/2016  . MDD (major depressive disorder), recurrent severe, without psychosis (HCC) 02/25/2016  . Cannabis use disorder, severe, dependence (HCC) 02/25/2016  . Bipolar disorder (HCC) 06/21/2014  . Anxiety 06/21/2014  . Cigarette smoker 06/21/2014  .  HIV disease (HCC) 06/20/2014  . Dyslipidemia 06/20/2014   Patient Centered Plan: Patient is on the following Treatment Plan(s):    Referrals to Alternative Service(s): Referred to Alternative Service(s):   Place:    Date:   Time:    Referred to Alternative Service(s):   Place:   Date:   Time:    Referred to Alternative Service(s):   Place:   Date:   Time:    Referred to Alternative Service(s):   Place:   Date:   Time:     Burnetta Sabin, [facility]

## 2020-12-10 NOTE — Progress Notes (Signed)
CSW received a call from an Intake Nurse with [facility], who has expressed interest in admitting the patient. The patient is currently under review with [facility].   Damita Dunnings, MSW, LCSW-A  10:42 AM 12/10/2020

## 2020-12-10 NOTE — ED Triage Notes (Signed)
Pt from home encoouraged by mother to be evaluated d/t strange behavior and pt complaint of not feeling right.

## 2020-12-10 NOTE — Discharge Instructions (Addendum)
Follow up with primary care provider regarding high cholesterol Will discharge to [facility]

## 2020-12-10 NOTE — Progress Notes (Signed)
Pt accepted to [facility]    Patient meets inpatient criteria per Rhea Belton, MD.   Dr.Stuart Krein is the attending provider.    Call report to [phone]  Rex Kras, RN @ [facility] notified.     Pt scheduled  to arrive at [facility] today.    Damita Dunnings, MSW, LCSW-A  11:07 AM 12/10/2020

## 2020-12-10 NOTE — Progress Notes (Signed)
Per Dr. Lucianne Muss, the Pt has been referred out due to no bed availability at [facility]. Patient meets inpatient criteria per Watt Climes, MD. Patient referred to the following facilities:  [facility]  [address] [place] [address] [phone]  [facility]  [address] [place] [address] [phone]  [facility]  [address] [place] [address] [phone]  [facility]  [address] [place] [address] [phone]  [facility]  [address], [place] [address] [phone]  [facility]  [address] [place] [address] [phone]  [facility]  [address] [place], [place] [address] [phone]  [facility]  [address] [place], [place] [address] [phone]  [facility]  [address] [place] [address] [phone]  [facility]  [address], [place] [address] [phone]  [facility]  [address], [place] [address]     CSW will continue to monitor disposition.   Damita Dunnings, MSW, LCSW-A  10:25 AM 12/10/2020

## 2020-12-11 DIAGNOSIS — F29 Unspecified psychosis not due to a substance or known physiological condition: Secondary | ICD-10-CM | POA: Diagnosis not present

## 2020-12-11 LAB — HEMOGLOBIN A1C
Hgb A1c MFr Bld: 5.6 % (ref 4.8–5.6)
Mean Plasma Glucose: 114 mg/dL

## 2020-12-12 DIAGNOSIS — F29 Unspecified psychosis not due to a substance or known physiological condition: Secondary | ICD-10-CM | POA: Diagnosis not present

## 2020-12-13 DIAGNOSIS — F29 Unspecified psychosis not due to a substance or known physiological condition: Secondary | ICD-10-CM | POA: Diagnosis not present

## 2021-02-10 ENCOUNTER — Other Ambulatory Visit: Payer: Self-pay

## 2021-02-10 ENCOUNTER — Emergency Department (HOSPITAL_COMMUNITY)
Admission: EM | Admit: 2021-02-10 | Discharge: 2021-02-10 | Disposition: A | Discharge status: Home / Self Care | Payer: Medicare Other | Attending: Student | Admitting: Student

## 2021-02-10 ENCOUNTER — Encounter (HOSPITAL_COMMUNITY): Payer: Self-pay

## 2021-02-10 DIAGNOSIS — Y900 Blood alcohol level of less than 20 mg/100 ml: Secondary | ICD-10-CM | POA: Insufficient documentation

## 2021-02-10 DIAGNOSIS — R21 Rash and other nonspecific skin eruption: Secondary | ICD-10-CM | POA: Insufficient documentation

## 2021-02-10 DIAGNOSIS — Z79899 Other long term (current) drug therapy: Secondary | ICD-10-CM | POA: Insufficient documentation

## 2021-02-10 DIAGNOSIS — F17210 Nicotine dependence, cigarettes, uncomplicated: Secondary | ICD-10-CM | POA: Insufficient documentation

## 2021-02-10 DIAGNOSIS — G4700 Insomnia, unspecified: Secondary | ICD-10-CM | POA: Insufficient documentation

## 2021-02-10 DIAGNOSIS — I10 Essential (primary) hypertension: Secondary | ICD-10-CM | POA: Insufficient documentation

## 2021-02-10 DIAGNOSIS — G479 Sleep disorder, unspecified: Secondary | ICD-10-CM

## 2021-02-10 LAB — CBC WITH DIFFERENTIAL/PLATELET
Abs Immature Granulocytes: 0.02 10*3/uL (ref 0.00–0.07)
Basophils Absolute: 0.1 10*3/uL (ref 0.0–0.1)
Basophils Relative: 1 %
Eosinophils Absolute: 0.5 10*3/uL (ref 0.0–0.5)
Eosinophils Relative: 5 %
HCT: 47.2 % (ref 39.0–52.0)
Hemoglobin: 15.5 g/dL (ref 13.0–17.0)
Immature Granulocytes: 0 %
Lymphocytes Relative: 29 %
Lymphs Abs: 3.1 10*3/uL (ref 0.7–4.0)
MCH: 31.6 pg (ref 26.0–34.0)
MCHC: 32.8 g/dL (ref 30.0–36.0)
MCV: 96.3 fL (ref 80.0–100.0)
Monocytes Absolute: 1 10*3/uL (ref 0.1–1.0)
Monocytes Relative: 9 %
Neutro Abs: 5.8 10*3/uL (ref 1.7–7.7)
Neutrophils Relative %: 56 %
Platelets: 506 10*3/uL — ABNORMAL HIGH (ref 150–400)
RBC: 4.9 MIL/uL (ref 4.22–5.81)
RDW: 13.2 % (ref 11.5–15.5)
WBC: 10.5 10*3/uL (ref 4.0–10.5)
nRBC: 0 % (ref 0.0–0.2)

## 2021-02-10 LAB — COMPREHENSIVE METABOLIC PANEL
ALT: 13 U/L (ref 0–44)
AST: 15 U/L (ref 15–41)
Albumin: 3.7 g/dL (ref 3.5–5.0)
Alkaline Phosphatase: 75 U/L (ref 38–126)
Anion gap: 10 (ref 5–15)
BUN: <5 mg/dL — ABNORMAL LOW (ref 6–20)
CO2: 24 mmol/L (ref 22–32)
Calcium: 9.4 mg/dL (ref 8.9–10.3)
Chloride: 103 mmol/L (ref 98–111)
Creatinine, Ser: 0.79 mg/dL (ref 0.61–1.24)
GFR, Estimated: >60 mL/min (ref >60)
Glucose, Bld: 97 mg/dL (ref 70–99)
Potassium: 3.9 mmol/L (ref 3.5–5.1)
Sodium: 137 mmol/L (ref 135–145)
Total Bilirubin: 1 mg/dL (ref 0.3–1.2)
Total Protein: 7.8 g/dL (ref 6.5–8.1)

## 2021-02-10 LAB — RAPID URINE DRUG SCREEN, HOSP PERFORMED
Amphetamines: NOT DETECTED
Barbiturates: NOT DETECTED
Benzodiazepines: NOT DETECTED
Cocaine: POSITIVE — AB
Opiates: NOT DETECTED
Tetrahydrocannabinol: NOT DETECTED

## 2021-02-10 LAB — ACETAMINOPHEN LEVEL: Acetaminophen (Tylenol), Serum: <10 ug/mL — ABNORMAL LOW (ref 10–30)

## 2021-02-10 LAB — ETHANOL: Alcohol, Ethyl (B): <10 mg/dL (ref <10)

## 2021-02-10 LAB — SALICYLATE LEVEL: Salicylate Lvl: <7 mg/dL — ABNORMAL LOW (ref 7.0–30.0)

## 2021-02-10 MED ORDER — MELATONIN 3 MG SL SUBL: 6.0000 mg | SUBLINGUAL_TABLET | Freq: Every day | SUBLINGUAL | 1 refills | Status: AC

## 2021-02-10 MED ORDER — HYDROXYZINE HCL 25 MG PO TABS: 25.0000 mg | ORAL_TABLET | Freq: Every evening | ORAL | 0 refills | Status: DC | PRN

## 2021-02-10 NOTE — ED Provider Notes (Signed)
[facility] EMERGENCY DEPARTMENT Provider Note   CSN: [id number] Arrival date & time: 02/10/21  1110     History No chief complaint on file.   Jeffery [name] is a 30 y.o. male.  With PMH HIV on HAART therapy with last CD4 858 on 05/06/2020, undetectable viral load, PTSD, HTN, major depressive disorder who presents to the emergency department evaluation of insomnia.  Patient states that he is unable to get a good night sleep for the last 1 week.  He states that he has been picking up extra hours at work and he feels that his sleep schedule is abnormal.  He endorses occasional recreational cocaine use but last use was 6 days ago.  He is still having difficulty sleeping despite not using cocaine.  Endorses increased stressors in both his personal life and at work and states that he was previously on psychiatric medications including Zyprexa but currently does not take any of these medications.  He arrives alert and oriented answering all questions appropriately with no evidence of active psychosis.  Denies chest pain, shortness of breath, Donnell pain, nausea, vomit, fever or other systemic symptoms.  HPI     Past Medical History:  Diagnosis Date   Anxiety    Chlamydia    Depression    Drug induced constipation    Hemorrhoids    HIV infection (HCC)    Hyperlipidemia    Hypertension    Pt went off meds on his own; has been monitored without any problems   Male-to-male transgender person    former history    Nicotine dependence    Syphilis 2013 and 2015    Patient Active Problem List   Diagnosis Date Noted   Genital warts 05/21/2020   Syphilis 05/21/2020   Cervical strain 09/16/2018   Assessment of effects of psychotropic drug in patient at risk for metabolic syndrome 09/16/2018   Hypertension 10/05/2016   MDD (major depressive disorder), recurrent severe, without psychosis (HCC) 02/25/2016   Cannabis use disorder, severe, dependence (HCC) 02/25/2016    Bipolar disorder (HCC) 06/21/2014   Anxiety 06/21/2014   Cigarette smoker 06/21/2014   HIV disease (HCC) 06/20/2014   Dyslipidemia 06/20/2014    History reviewed. No pertinent surgical history.     Family History  Problem Relation Age of Onset   Hypertension Father    Hypertension Maternal Grandmother     Social History   Tobacco Use   Smoking status: Light Smoker    Packs/day: 0.25    Types: Cigarettes   Smokeless tobacco: Never   Tobacco comments:    5 cigarettes a day  Vaping Use   Vaping Use: Never used  Substance Use Topics   Alcohol use: Yes    Alcohol/week: 0.0 standard drinks    Comment: occasional   Drug use: Yes    Frequency: 3.0 times per week    Types: Marijuana    Home Medications Prior to Admission medications   Medication Sig Start Date End Date Taking? Authorizing Provider  hydrOXYzine (ATARAX/VISTARIL) 25 MG tablet Take 1 tablet (25 mg total) by mouth at bedtime as needed for anxiety. 02/10/21  Yes Laverta Harnisch, MD  Melatonin 3 MG SUBL Place 6 mg under the tongue at bedtime. 02/10/21 03/12/21 Yes Tanaysia Bhardwaj, MD  acetaminophen (TYLENOL) 500 MG tablet Take 500 mg by mouth every 6 (six) hours as needed for mild pain.    [provider]  amLODipine (NORVASC) 5 MG tablet TAKE 1 TABLET BY MOUTH ONCE DAILY  Patient not taking: Reported on 05/21/2020 04/25/18   Cliffton Asters, MD  atorvastatin (LIPITOR) 40 MG tablet Take 1 tablet (40 mg total) by mouth daily. Patient not taking: Reported on 05/21/2020 09/16/18   Garnette Gunner, MD  FLUoxetine (PROZAC) 20 MG capsule Take 1 capsule (20 mg total) by mouth daily. 12/11/20   Lauro Franklin, MD  imiquimod Mathis Dad) 5 % cream Apply topically 3 (three) times a week. 05/22/20   Cliffton Asters, MD  OLANZapine (ZYPREXA) 10 MG tablet Take 1 tablet (10 mg total) by mouth at bedtime. 12/10/20 01/09/21  Lauro Franklin, MD  prazosin (MINIPRESS) 2 MG capsule Take 2 mg by mouth at bedtime.    [provider]  PREZCOBIX 800-150 MG tablet TAKE 1 TABLET BY MOUTH EVERY DAY WITH FOOD. SWALLOW WHOLE. DO NOT CRUSH, BREAK, OR CHEW 07/12/20   Cliffton Asters, MD  TRIUMEQ 600-50-300 MG tablet TAKE 1 TABLET BY MOUTH DAILY 07/12/20   Cliffton Asters, MD    Allergies    Bee venom and Lisinopril  Review of Systems   Review of Systems  Constitutional:  Negative for chills, fatigue and fever.  HENT:  Negative for congestion, rhinorrhea and sore throat.   Eyes:  Negative for pain, discharge, redness and itching.  Respiratory:  Negative for cough, shortness of breath and wheezing.   Cardiovascular:  Negative for chest pain.  Gastrointestinal:  Negative for abdominal pain, diarrhea, nausea and vomiting.  Genitourinary:  Negative for dysuria and flank pain.  Musculoskeletal:  Negative for arthralgias and myalgias.  Skin:  Negative for rash.  Neurological:  Negative for dizziness, syncope, weakness, numbness and headaches.  Psychiatric/Behavioral:  Positive for sleep disturbance.    Physical Exam Updated Vital Signs BP (!) 137/96   Pulse 66   Temp 98.1 F (36.7 C) (Oral)   Resp 18   SpO2 99%   Physical Exam Vitals and nursing note reviewed.  Constitutional:      Appearance: He is well-developed.  HENT:     Head: Normocephalic and atraumatic.  Eyes:     Conjunctiva/sclera: Conjunctivae normal.  Cardiovascular:     Rate and Rhythm: Normal rate and regular rhythm.     Heart sounds: No murmur heard. Pulmonary:     Effort: Pulmonary effort is normal. No respiratory distress.     Breath sounds: Normal breath sounds.  Abdominal:     Palpations: Abdomen is soft.     Tenderness: There is no abdominal tenderness.  Musculoskeletal:     Cervical back: Neck supple.  Skin:    General: Skin is warm and dry.     Findings: Rash (Birthmark hyperpigmented raised lesions to the left upper extremity, trunk, neck) present.  Neurological:     Mental Status: He is alert.    ED Results / Procedures /  Treatments   Labs (all labs ordered are listed, but only abnormal results are displayed) Labs Reviewed  COMPREHENSIVE METABOLIC PANEL - Abnormal; Notable for the following components:      Result Value   BUN <5 (*)    All other components within normal limits  RAPID URINE DRUG SCREEN, HOSP PERFORMED - Abnormal; Notable for the following components:   Cocaine POSITIVE (*)    All other components within normal limits  CBC WITH DIFFERENTIAL/PLATELET - Abnormal; Notable for the following components:   Platelets 506 (*)    All other components within normal limits  ACETAMINOPHEN LEVEL - Abnormal; Notable for the following components:   Acetaminophen (Tylenol),  Serum <10 (*)    All other components within normal limits  SALICYLATE LEVEL - Abnormal; Notable for the following components:   Salicylate Lvl <7.0 (*)    All other components within normal limits  ETHANOL    EKG None  Radiology No results found.  Procedures Procedures   Medications Ordered in ED Medications - No data to display  ED Course  I have reviewed the triage vital signs and the nursing notes.  Pertinent labs & imaging results that were available during my care of the patient were reviewed by me and considered in my medical decision making (see chart for details).    MDM Rules/Calculators/A&P                           Patient seen in the emergency department for evaluation of insomnia.  Physical exam is unremarkable outside of a birthmark hyperpigmented lesions on the left upper extremity and in the neck.  Cardiopulmonary exam unremarkable.  I had a long discussion with the patient about appropriate sleep hygiene and need for a regulated sleep schedule.  We will begin with 6 mg melatonin 3 hours prior to sleep, and a prescription for Atarax was given to the patient for breakthrough anxiety only to assist with sleep.  He was instructed to follow-up with his primary care physician to see if these interventions  have helped and also encouraged to reach back out to [facility] to discuss need for continued to cessation or readministration of his scheduled psychiatric medications.  Patient then discharged.  Final Clinical Impression(s) / ED Diagnoses Final diagnoses:  Difficulty sleeping    Rx / DC Orders ED Discharge Orders          Ordered    Melatonin 3 MG SUBL  Daily at bedtime        02/10/21 1621    hydrOXYzine (ATARAX/VISTARIL) 25 MG tablet  At bedtime PRN        02/10/21 1628             Stepheni Cameron, Wyn Forster, MD 02/10/21 1637

## 2021-02-10 NOTE — ED Provider Notes (Signed)
Emergency Medicine Provider Triage Evaluation Note  Jeffery [name] , a 30 y.o. male  was evaluated in triage.  Pt complains of not sleeping well for the past week. Reports similar issues actually since June of this year when he was admitted to behavioral health [facility] for hallucinations. He reports he was placed on a medication at that time which helped him sleep however he recently ran out. He tried to follow up with his PCP however has not. He states he is currently not on any medications. Also reports depression and thought of suicidal ideation however denies any specific plan. Admits to being "tired of not sleeping." Denies hallucinations. No HI.   Review of Systems  Positive: + insomnia, depression Negative: - HI, AVH  Physical Exam  BP 128/88   Pulse 70   Temp 98.1 F (36.7 C) (Oral)   Resp 14   SpO2 99%  Gen:   Awake, no distress   Resp:  Normal effort  MSK:   Moves extremities without difficulty  Other:    Medical Decision Making  Medically screening exam initiated at 11:39 AM.  Appropriate orders placed.  Ulyses Panico was informed that the remainder of the evaluation will be completed by another provider, this initial triage assessment does not replace that evaluation, and the importance of remaining in the ED until their evaluation is complete.  Medical clearance labs ordered. Likely needs to be placed back on medications by TTS/psych.    Tanda Rockers, PA-C 02/10/21 1141    Gloris Manchester, MD 02/10/21 [phone]

## 2021-02-10 NOTE — Discharge Instructions (Addendum)
You were seen in the emergency department for evaluation of insomnia.  Here in the emergency department, we had a long discussion about proper sleep hygiene and information is listed in this discharge paperwork.  Prescriptions for melatonin were sent to the pharmacy, and a prescription for hydroxyzine was also sent for breakthrough anxiety only if the melatonin is not working.  It is important that you reach out to her primary care physician and back out to [facility] to discuss appropriate mental health resources as you are no longer taking your previously prescribed psychiatric medications.  At this time you are safe for discharge and please call your primary care physician today to schedule appointment.

## 2021-02-10 NOTE — ED Triage Notes (Signed)
Patient complains of hx of anxiety and depression and not sleeping for the past 1 week. Patient alert and oriented, denies physical pain. Patient complains of some depression. Denies HI/SI

## 2021-02-20 ENCOUNTER — Telehealth: Payer: Self-pay

## 2021-02-20 ENCOUNTER — Other Ambulatory Visit: Payer: Self-pay | Admitting: Internal Medicine

## 2021-02-20 DIAGNOSIS — B20 Human immunodeficiency virus [HIV] disease: Secondary | ICD-10-CM

## 2021-02-20 NOTE — Telephone Encounter (Signed)
Attempted to call patient to schedule overdue appointment as well as to follow up on refill request for his Triumeq. Not able to reach patient at this time.  Called [facility] for refill history. Per pharmacist patient has not filled with them since 1/22. Patient has been filling at different [facility]; 2/9,4/8,6/9,7/24. Refill request denied. Will need to ensure patient is taking medication as prescribed. Patient also needs to schedule appt for labs and follow up. Juanita Laster, RMA

## 2021-04-18 ENCOUNTER — Inpatient Hospital Stay (HOSPITAL_COMMUNITY)
Admission: RE | Admit: 2021-04-18 | Discharge: 2021-04-22 | DRG: 885 | Disposition: A | Discharge status: Home / Self Care | Payer: Medicare Other | Attending: Emergency Medicine | Admitting: Emergency Medicine

## 2021-04-18 ENCOUNTER — Encounter (HOSPITAL_COMMUNITY): Payer: Self-pay | Admitting: Emergency Medicine

## 2021-04-18 ENCOUNTER — Other Ambulatory Visit: Payer: Self-pay

## 2021-04-18 ENCOUNTER — Ambulatory Visit (HOSPITAL_COMMUNITY)
Admission: EM | Admit: 2021-04-18 | Discharge: 2021-04-18 | Disposition: A | Discharge status: Other Acute Inpatient Hospital | Payer: Medicare Other | Attending: Student | Admitting: Student

## 2021-04-18 DIAGNOSIS — F250 Schizoaffective disorder, bipolar type: Secondary | ICD-10-CM

## 2021-04-18 DIAGNOSIS — B20 Human immunodeficiency virus [HIV] disease: Secondary | ICD-10-CM | POA: Diagnosis present

## 2021-04-18 DIAGNOSIS — F419 Anxiety disorder, unspecified: Secondary | ICD-10-CM | POA: Diagnosis present

## 2021-04-18 DIAGNOSIS — F17210 Nicotine dependence, cigarettes, uncomplicated: Secondary | ICD-10-CM | POA: Insufficient documentation

## 2021-04-18 DIAGNOSIS — F251 Schizoaffective disorder, depressive type: Principal | ICD-10-CM | POA: Diagnosis present

## 2021-04-18 DIAGNOSIS — F259 Schizoaffective disorder, unspecified: Secondary | ICD-10-CM | POA: Diagnosis present

## 2021-04-18 DIAGNOSIS — E785 Hyperlipidemia, unspecified: Secondary | ICD-10-CM | POA: Diagnosis present

## 2021-04-18 DIAGNOSIS — Z8619 Personal history of other infectious and parasitic diseases: Secondary | ICD-10-CM | POA: Diagnosis not present

## 2021-04-18 DIAGNOSIS — Z560 Unemployment, unspecified: Secondary | ICD-10-CM | POA: Insufficient documentation

## 2021-04-18 DIAGNOSIS — Z9151 Personal history of suicidal behavior: Secondary | ICD-10-CM | POA: Diagnosis not present

## 2021-04-18 DIAGNOSIS — Z888 Allergy status to other drugs, medicaments and biological substances status: Secondary | ICD-10-CM | POA: Diagnosis not present

## 2021-04-18 DIAGNOSIS — G4700 Insomnia, unspecified: Secondary | ICD-10-CM | POA: Diagnosis present

## 2021-04-18 DIAGNOSIS — Z79899 Other long term (current) drug therapy: Secondary | ICD-10-CM | POA: Insufficient documentation

## 2021-04-18 DIAGNOSIS — Z20822 Contact with and (suspected) exposure to covid-19: Secondary | ICD-10-CM | POA: Diagnosis present

## 2021-04-18 DIAGNOSIS — R1110 Vomiting, unspecified: Secondary | ICD-10-CM | POA: Diagnosis not present

## 2021-04-18 DIAGNOSIS — I10 Essential (primary) hypertension: Secondary | ICD-10-CM | POA: Diagnosis present

## 2021-04-18 DIAGNOSIS — Z21 Asymptomatic human immunodeficiency virus [HIV] infection status: Secondary | ICD-10-CM | POA: Diagnosis not present

## 2021-04-18 DIAGNOSIS — Z113 Encounter for screening for infections with a predominantly sexual mode of transmission: Secondary | ICD-10-CM | POA: Insufficient documentation

## 2021-04-18 LAB — HEPATITIS PANEL, ACUTE
HCV Ab: NONREACTIVE
Hep A IgM: NONREACTIVE
Hep B C IgM: NONREACTIVE
Hepatitis B Surface Ag: NONREACTIVE

## 2021-04-18 LAB — COMPREHENSIVE METABOLIC PANEL
ALT: 16 U/L (ref 0–44)
AST: 20 U/L (ref 15–41)
Albumin: 4.3 g/dL (ref 3.5–5.0)
Alkaline Phosphatase: 85 U/L (ref 38–126)
Anion gap: 14 (ref 5–15)
BUN: <5 mg/dL — ABNORMAL LOW (ref 6–20)
CO2: 23 mmol/L (ref 22–32)
Calcium: 10 mg/dL (ref 8.9–10.3)
Chloride: 99 mmol/L (ref 98–111)
Creatinine, Ser: 0.9 mg/dL (ref 0.61–1.24)
GFR, Estimated: >60 mL/min (ref >60)
Glucose, Bld: 81 mg/dL (ref 70–99)
Potassium: 3.7 mmol/L (ref 3.5–5.1)
Sodium: 136 mmol/L (ref 135–145)
Total Bilirubin: 0.6 mg/dL (ref 0.3–1.2)
Total Protein: 7.7 g/dL (ref 6.5–8.1)

## 2021-04-18 LAB — TSH: TSH: 1.132 u[IU]/mL (ref 0.350–4.500)

## 2021-04-18 LAB — CBC WITH DIFFERENTIAL/PLATELET
Abs Immature Granulocytes: 0.07 10*3/uL (ref 0.00–0.07)
Basophils Absolute: 0.1 10*3/uL (ref 0.0–0.1)
Basophils Relative: 1 %
Eosinophils Absolute: 0.3 10*3/uL (ref 0.0–0.5)
Eosinophils Relative: 2 %
HCT: 47.8 % (ref 39.0–52.0)
Hemoglobin: 16.6 g/dL (ref 13.0–17.0)
Immature Granulocytes: 0 %
Lymphocytes Relative: 27 %
Lymphs Abs: 4.5 10*3/uL — ABNORMAL HIGH (ref 0.7–4.0)
MCH: 32 pg (ref 26.0–34.0)
MCHC: 34.7 g/dL (ref 30.0–36.0)
MCV: 92.3 fL (ref 80.0–100.0)
Monocytes Absolute: 1.5 10*3/uL — ABNORMAL HIGH (ref 0.1–1.0)
Monocytes Relative: 9 %
Neutro Abs: 10.3 10*3/uL — ABNORMAL HIGH (ref 1.7–7.7)
Neutrophils Relative %: 61 %
Platelets: 369 10*3/uL (ref 150–400)
RBC: 5.18 MIL/uL (ref 4.22–5.81)
RDW: 13.7 % (ref 11.5–15.5)
WBC: 16.6 10*3/uL — ABNORMAL HIGH (ref 4.0–10.5)
nRBC: 0 % (ref 0.0–0.2)

## 2021-04-18 LAB — POCT URINE DRUG SCREEN - MANUAL ENTRY (I-SCREEN)
POC Amphetamine UR: NOT DETECTED
POC Buprenorphine (BUP): NOT DETECTED
POC Cocaine UR: NOT DETECTED
POC Marijuana UR: NOT DETECTED
POC Methadone UR: NOT DETECTED
POC Methamphetamine UR: NOT DETECTED
POC Morphine: NOT DETECTED
POC Oxazepam (BZO): NOT DETECTED
POC Oxycodone UR: NOT DETECTED
POC Secobarbital (BAR): NOT DETECTED

## 2021-04-18 LAB — ETHANOL: Alcohol, Ethyl (B): <10 mg/dL (ref <10)

## 2021-04-18 LAB — RPR
RPR Ser Ql: REACTIVE — AB
RPR Titer: 1:8 {titer}

## 2021-04-18 LAB — LIPID PANEL
Cholesterol: 319 mg/dL — ABNORMAL HIGH (ref 0–200)
HDL: 49 mg/dL (ref >40)
LDL Cholesterol: 251 mg/dL — ABNORMAL HIGH (ref 0–99)
Total CHOL/HDL Ratio: 6.5 RATIO
Triglycerides: 96 mg/dL (ref <150)
VLDL: 19 mg/dL (ref 0–40)

## 2021-04-18 LAB — HEMOGLOBIN A1C
Hgb A1c MFr Bld: 5.4 % (ref 4.8–5.6)
Mean Plasma Glucose: 108.28 mg/dL

## 2021-04-18 LAB — RESP PANEL BY RT-PCR (FLU A&B, COVID) ARPGX2
Influenza A by PCR: NEGATIVE
Influenza B by PCR: NEGATIVE
SARS Coronavirus 2 by RT PCR: NEGATIVE

## 2021-04-18 LAB — POC SARS CORONAVIRUS 2 AG -  ED: SARS Coronavirus 2 Ag: NEGATIVE

## 2021-04-18 MED ORDER — ALUM & MAG HYDROXIDE-SIMETH 200-200-20 MG/5ML PO SUSP
30.0000 mL | ORAL | Status: DC | PRN
Start: 2021-04-18 — End: 2021-04-18

## 2021-04-18 MED ORDER — OLANZAPINE 10 MG PO TABS: 10.0000 mg | ORAL_TABLET | Freq: Every day | ORAL | Status: DC

## 2021-04-18 MED ORDER — ATORVASTATIN CALCIUM 40 MG PO TABS
40.0000 mg | ORAL_TABLET | Freq: Every day | ORAL | Status: DC
  Administered 2021-04-20 – 2021-04-22 (×3): 40 mg via ORAL
  Filled 2021-04-18 (×6): qty 1

## 2021-04-18 MED ORDER — CLONIDINE HCL 0.1 MG PO TABS
0.1000 mg | ORAL_TABLET | Freq: Once | ORAL | Status: AC
  Administered 2021-04-18: 0.1 mg via ORAL
  Filled 2021-04-18: qty 1

## 2021-04-18 MED ORDER — TRAZODONE HCL 50 MG PO TABS
50.0000 mg | ORAL_TABLET | Freq: Every evening | ORAL | Status: DC | PRN
  Administered 2021-04-18: 50 mg via ORAL
  Filled 2021-04-18: qty 1

## 2021-04-18 MED ORDER — ACETAMINOPHEN 325 MG PO TABS: 650.0000 mg | ORAL_TABLET | Freq: Four times a day (QID) | ORAL | Status: DC | PRN

## 2021-04-18 MED ORDER — DARUNAVIR-COBICISTAT 800-150 MG PO TABS
1.0000 | ORAL_TABLET | Freq: Every day | ORAL | Status: DC
  Administered 2021-04-18: 1 via ORAL
  Filled 2021-04-18: qty 1

## 2021-04-18 MED ORDER — ONDANSETRON 4 MG PO TBDP: 4.0000 mg | ORAL_TABLET | Freq: Three times a day (TID) | ORAL | Status: DC | PRN

## 2021-04-18 MED ORDER — OLANZAPINE 10 MG PO TABS
10.0000 mg | ORAL_TABLET | Freq: Every day | ORAL | Status: DC
  Administered 2021-04-18 – 2021-04-21 (×4): 10 mg via ORAL
  Filled 2021-04-18 (×8): qty 1

## 2021-04-18 MED ORDER — FLUOXETINE HCL 20 MG PO CAPS
20.0000 mg | ORAL_CAPSULE | Freq: Every day | ORAL | Status: DC
  Administered 2021-04-18: 20 mg via ORAL
  Filled 2021-04-18: qty 1

## 2021-04-18 MED ORDER — MAGNESIUM HYDROXIDE 400 MG/5ML PO SUSP: 30.0000 mL | Freq: Every day | ORAL | Status: DC | PRN

## 2021-04-18 MED ORDER — FLUOXETINE HCL 20 MG PO CAPS
20.0000 mg | ORAL_CAPSULE | Freq: Every day | ORAL | Status: DC
  Administered 2021-04-20 – 2021-04-22 (×3): 20 mg via ORAL
  Filled 2021-04-18 (×6): qty 1

## 2021-04-18 MED ORDER — HYDROXYZINE HCL 25 MG PO TABS
25.0000 mg | ORAL_TABLET | Freq: Three times a day (TID) | ORAL | Status: DC | PRN
Start: 2021-04-18 — End: 2021-04-18
  Administered 2021-04-18: 25 mg via ORAL
  Filled 2021-04-18: qty 1

## 2021-04-18 MED ORDER — AMLODIPINE BESYLATE 5 MG PO TABS
5.0000 mg | ORAL_TABLET | Freq: Every day | ORAL | Status: DC
  Administered 2021-04-18: 5 mg via ORAL
  Filled 2021-04-18: qty 1

## 2021-04-18 MED ORDER — ATORVASTATIN CALCIUM 40 MG PO TABS: 40.0000 mg | ORAL_TABLET | Freq: Every day | ORAL | Status: DC

## 2021-04-18 MED ORDER — ATORVASTATIN CALCIUM 40 MG PO TABS
40.0000 mg | ORAL_TABLET | Freq: Every day | ORAL | Status: DC
  Administered 2021-04-18: 40 mg via ORAL
  Filled 2021-04-18: qty 1

## 2021-04-18 MED ORDER — AMLODIPINE BESYLATE 5 MG PO TABS
5.0000 mg | ORAL_TABLET | Freq: Every day | ORAL | Status: DC
  Administered 2021-04-20 – 2021-04-22 (×3): 5 mg via ORAL
  Filled 2021-04-18 (×7): qty 1

## 2021-04-18 MED ORDER — ALUM & MAG HYDROXIDE-SIMETH 200-200-20 MG/5ML PO SUSP: 30.0000 mL | ORAL | Status: DC | PRN

## 2021-04-18 MED ORDER — DARUNAVIR-COBICISTAT 800-150 MG PO TABS
1.0000 | ORAL_TABLET | Freq: Every day | ORAL | Status: DC
  Administered 2021-04-20 – 2021-04-22 (×3): 1 via ORAL
  Filled 2021-04-18 (×8): qty 1

## 2021-04-18 MED ORDER — OLANZAPINE 10 MG PO TABS
10.0000 mg | ORAL_TABLET | Freq: Every day | ORAL | Status: DC
  Administered 2021-04-18: 10 mg via ORAL
  Filled 2021-04-18: qty 1
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
## 2021-04-18 NOTE — ED Provider Notes (Addendum)
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  98 - 111 mmol/L Final   CO2 12/09/2020 23  22 - 32 mmol/L Final   Glucose, Bld 12/09/2020 83  70 - 99 mg/dL Final   Glucose reference range applies only to samples taken after fasting for at least 8 hours.   BUN 12/09/2020 7  6 - 20 mg/dL Final   Creatinine, Ser 12/09/2020 0.92  0.61 - 1.24 mg/dL Final   Calcium 78/46/9629 9.3  8.9 - 10.3 mg/dL Final   Total Protein 52/84/1324 8.0  6.5 - 8.1 g/dL Final   Albumin 40/04/2724 3.9  3.5 - 5.0 g/dL Final   AST 36/64/4034 17  15 - 41 U/L Final   ALT 12/09/2020 15  0 - 44 U/L Final   Alkaline Phosphatase 12/09/2020 90  38 - 126 U/L Final   Total  Bilirubin 12/09/2020 0.7  0.3 - 1.2 mg/dL Final   GFR, Estimated 12/09/2020 >60  >60 mL/min Final   Comment: (NOTE) Calculated using the CKD-EPI Creatinine Equation (2021)    Anion gap 12/09/2020 11  5 - 15 Final   Performed at [facility], [address] [place], [place] [address]   Magnesium 12/09/2020 1.9  1.7 - 2.4 mg/dL Final   Performed at [facility], [address], [place], [place] [address]   Hgb A1c MFr Bld 12/09/2020 5.6  4.8 - 5.6 % Final   Comment: (NOTE)         Prediabetes: 5.7 - 6.4         Diabetes: >6.4         Glycemic control for adults with diabetes: <7.0    Mean Plasma Glucose 12/09/2020 114  mg/dL Final   Comment: (NOTE) Performed At: [facility] [address] [place], [place] [id number] Jolene Schimke MD OA:[id number]    Alcohol, Ethyl (B) 12/09/2020 <10  <10 mg/dL Final   Comment: (NOTE) Lowest detectable limit for serum alcohol is 10 mg/dL.  For medical purposes only. Performed  at [facility], [address], [place], [place] [address]    TSH 12/09/2020 2.121  0.350 - 4.500 uIU/mL Final   Comment: Performed by a 3rd Generation assay with a functional sensitivity of <=0.01 uIU/mL. Performed at [facility], [address], [place], [place] [address]    POC Amphetamine UR 12/09/2020 None Detected  NONE DETECTED (Cut Off Level 1000 ng/mL) Final   POC Secobarbital (BAR) 12/09/2020 None Detected  NONE DETECTED (Cut Off Level 300 ng/mL) Final   POC Buprenorphine (BUP) 12/09/2020 None Detected  NONE DETECTED (Cut Off Level 10 ng/mL) Final   POC Oxazepam (BZO) 12/09/2020 None Detected  NONE DETECTED (Cut Off Level 300 ng/mL) Final   POC Cocaine UR 12/09/2020 Positive (A) NONE DETECTED (Cut Off Level 300 ng/mL) Final   POC Methamphetamine UR 12/09/2020 None Detected  NONE DETECTED (Cut Off Level 1000 ng/mL) Final   POC Morphine 12/09/2020 None Detected  NONE DETECTED (Cut Off Level 300 ng/mL) Final   POC Oxycodone UR  12/09/2020 None Detected  NONE DETECTED (Cut Off Level 100 ng/mL) Final   POC Methadone UR 12/09/2020 None Detected  NONE DETECTED (Cut Off Level 300 ng/mL) Final   POC Marijuana UR 12/09/2020 None Detected  NONE DETECTED (Cut Off Level 50 ng/mL) Final   SARSCOV2ONAVIRUS 2 AG 12/09/2020 NEGATIVE  NEGATIVE Final   Comment: (NOTE) SARS-CoV-2 antigen NOT DETECTED.   Negative results are presumptive.  Negative results do not preclude SARS-CoV-2 infection and should not be used as the sole basis for treatment or other patient management decisions, including infection  control decisions, particularly in the presence of clinical signs and  symptoms consistent with COVID-19, or in those who have been in contact with the virus.  Negative results must be combined with clinical observations, patient history, and epidemiological information. The expected result is Negative.  Fact Sheet for Patients: https://www.jennings-kim.com/  Fact Sheet for Healthcare Providers: https://alexander-rogers.biz/  This test is not yet approved or cleared by the [place] FDA and  has been authorized for detection and/or diagnosis of SARS-CoV-2 by FDA under an Emergency Use Authorization (EUA).  This EUA will remain in effect (meaning this test can be used) for the duration of  the COV                          ID-19 declaration under Section 564(b)(1) of the Act, 21 U.S.C. section 360bbb-3(b)(1), unless the authorization is terminated or revoked sooner.     Cholesterol 12/09/2020 289 (A) 0 - 200 mg/dL Final   Triglycerides 09/81/1914 158 (A) <150 mg/dL Final   HDL 78/29/5621 40 (A) >40 mg/dL Final   Total CHOL/HDL Ratio 12/09/2020 7.2  RATIO Final   VLDL 12/09/2020 32  0 - 40 mg/dL Final   LDL Cholesterol 12/09/2020 217 (A) 0 - 99 mg/dL Final   Comment:        Total Cholesterol/HDL:CHD Risk Coronary Heart Disease Risk Table                     Men   Women  1/2 Average Risk   3.4    3.3  Average Risk       5.0   4.4  2 X Average Risk   9.6   7.1  3 X Average Risk  23.4   11.0        Use the calculated Patient Ratio above and the CHD Risk Table to  determine the patient's CHD Risk.        ATP III CLASSIFICATION (LDL):  <100     mg/dL   Optimal  161-096  mg/dL   Near or Above                    Optimal  130-159  mg/dL   Borderline  045-409  mg/dL   High  >811     mg/dL   Very High Performed at [facility], [address], [place], [place] [address]     Allergies: Bee venom and Lisinopril  PTA Medications: (Not in a hospital admission)   Medical Decision Making  Patient is a 30 year old male with past psychiatric and medical history as stated above who presents to the [facility] voluntarily via police after patient's mother called the police for concerns for her safety regarding the patient pouring kerosene outside of patient's mother's bedroom door when they and also in other parts of the home and flicking a lighter (see HPI for details).  Although patient denies SI and HI at this time, due to the extreme severity of patient's mother safety concerns and reports from patient's mother regarding patient's recent threatening behavior noted above and in HPI, patient appears to be a serious threat to others and himself at this time.  Thus, patient meets inpatient psychiatric treatment criteria.    Recommendations  Based on my evaluation the patient does not appear to have an emergency medical condition.  Recommend inpatient psychiatric treatment for the patient.  Patient is agreeable to inpatient psychiatric treatment.  Patient currently under review by [facility] for potential placement for inpatient psychiatric treatment at [facility]. Per night shift [facility] AC, patient to be reviewed for potential [facility] Admission by day shift AC.   [facility] for further stabilization and treatment while waiting for placement for inpatient psychiatric  treatment.   Labs/tests ordered and reviewed:  -PCR Flu A&, COVID: Results pending  -UDS: Negative for all substances  -CBC with differential: Leukocytosis noted with white blood cell count elevated at 16.6 K/uL, neutrophil count elevated at 10.3 K/uL, Abs Lymphs elevated at 4.5 K/uL, absolute monocyte count elevated at 1.5 K/uL.  CBC otherwise unremarkable.  -CMP: BUN less than 5 mg/dL.  CMP otherwise unremarkable.  -Hemoglobin A1c ordered due to patient's history of taking antipsychotic medications: Hemoglobin A1c within normal limits at 5.4%.  Of note, patient's last 12/09/2020 hemoglobin A1c was within normal limits at 5.6%.  -Ethanol: Less than 10 mg/dL/within normal limits  -Lipid panel ordered due to patient's history of taking antipsychotic medications: Total cholesterol elevated at 319 mg/dL, LDL cholesterol elevated at 251 mg/dL.  Of note, patient's last 12/09/2020 lipid panel showed elevated total cholesterol at 289 mg/dL, elevated triglycerides at 158 mg/dL, reduced HDL cholesterol at 40 mg/dL, and LDL cholesterol elevated at 217 mg/dL.  -TSH ordered due to patient's history of taking antipsychotic medications: Collected, results pending.  Of note, patient's last 12/09/2020 TSH value was within normal limits.  -Due to patient's history of HIV, the following STI screening labs were ordered:   -RPR: There is potential that there may not be enough blood specimen from patient's blood draw for this lab to be done.  Thus, patient may need to have blood work returned in the future during inpatient admission to have this lab checked.   -Hepatitis panel: There is potential that there may not be enough blood specimen from patient's blood draw for  this lab to be done.  Thus, patient may need to have blood work returned in the future during inpatient admission to have this lab checked.   -GC/Chlamydia urine: This lab was ordered after the patient had already provided a urine specimen for UDS.  Patient will  need to provide another urine specimen for this lab to be done.  -EKG ordered to check patient's QTC for reinitiation of antipsychotic medications.  Patient's EKG shows sinus rhythm with sinus arrhythmia but shows no acute/concerning findings with ventricular rate 81 bpm, PR interval 174 ms, QRS 92 ms, and QT/QTC 352/408 ms.  Patient's QT/QTC is appropriate for reinitiation of antipsychotic medications at this time.  Per patient, the only home medications he is currently taking at this time are Prezcobix and Triumeq daily.  However, per patient's mother, she is only knowledgeable of the patient taking Prezcobix 800 mg daily and does not have knowledge of other HIV medications. I am unable to find recent record of Triumeq prescription.   -Thus, at this time will place order for Prezcobix 800-150 mg p.o. daily with breakfast for HIV, but will hold off on ordering Triumeq at this time.  Recommend that pharmacy attempt to fully reconcile patient's home medications/determine if patient is actually taking Triumeq at this time before Triumeq is potentially started during patient's admission.  Will restart the following medications at this time:  -Norvasc 5 mg p.o. daily for hypertension with first dose scheduled for this morning on 04/18/2021 at 10:00 AM  -Prozac 20 mg p.o. daily for depression  -Zyprexa 10 mg p.o. daily at bedtime for schizoaffective disorder/psychosis  -Lipitor 40 mg p.o. daily for hyperlipidemia/dyslipidemia  We will give 1 time dose of clonidine 0.1 mg p.o. now for elevated BP (BP 162/104) (given at 0304).  Vistaril 25 mg p.o. 3 times daily as needed orders for anxiety Trazodone 50 mg p.o. at bedtime as needed ordered for sleep.  Zofran ODT 4 mg p.o. every 8 hours as needed ordered for nausea/vomiting.  Jaclyn Shaggy, PA-C 04/18/21  2:52 AM

## 2021-04-18 NOTE — Progress Notes (Signed)
Pt accepted to [facility] 307-2     Patient meets inpatient criteria per Liborio Nixon, NP  The attending provider will be Dr. Mason Jim    Call report to [phone]    Dossie Arbour, RN @ [facility] notified.     Pt scheduled  to arrive at [facility] today by 1330. RN to send voluntary consent to facility prior to transporting the patient.    Damita Dunnings, MSW, LCSW-A  12:15 PM 04/18/2021

## 2021-04-18 NOTE — Discharge Instructions (Addendum)
Pt accepted to [facility].  

## 2021-04-18 NOTE — BH Assessment (Addendum)
Comprehensive Clinical Assessment (CCA) Note  04/18/2021 Jeffery [name] [id number] Disposition: Patient was brought to [facility] by law enforcement.  Pt was seen by this clinician and PA Melbourne Abts.  Cody recommends inpatient psychiatric care.  Pt is agreeable to this disposition.  Pt to be referred out by CSW.  Patient is tense but cooperative.  He says he does not want to go back to live with his mother.  Pt mother did not feel safe tonight to have patient come back home tonight.  Pt admits to hearing voices.  He says he feels at times that others may be watching him.  Pt reports poor sleep.  Appetite is WNL.    Pt was at [facility] in June '22.  He has no current outpatient care.  He did go to [place] and has had services from them in the past.  Pt says that he got cut off from a telehealth call from them and they did not reschedule.     Chief Complaint:  Chief Complaint  Patient presents with   Schizophrenia   Visit Diagnosis: Schizophrenia    CCA Screening, Triage and Referral (STR)  Patient Reported Information How did you hear about [place]? Legal System (Mother had called the police.)  What Is the Reason for Your Visit/Call Today? Pt says that the police had been called and they brought him to [facility].  Pt says that he had been arguing with mother tonight.  He said he felt like she was trying to tell him what to do.  Patient says he has no current SI but admits that he did try to overdose on his zyprexa about a month and a half ago.  Pt says he just ended up sleeping it off.  Pt denies any current HI.  He admits to hearing voices that tell him "various things."  Pt says that he has had numerous suicide attempts in the past.  Patient denies any access to guns.  Pt was on prescription meds but has not taken them since the summer.  Pt had been admitted to [facility] in June '22.  When discharged he did follow up with [facility].  He said that he got disconnected from the psychiatrist  and did not attempt to go back.  Pt says he may drink a beer about 1-2 times in a month.  Pt last used beer, drinking 1.5 beers on 10/13.  Pt admis to using cocaine  Last use of cocaine was earlier yesterday.  Patient gave permission to call mother.  She said that she and patient did talk about mistake he had made about work and now he is out of a job.  Patient says that it is okay to call mother to gather more information.  Mother, Jeffery [name] [phone].  Mother says that patient had taken some karolsene and poured it on hir bedroom door and on the rug.  She said that she heard a lighter flick and she got up.  Pt asked her if everything was okay and continued to light the lighter.  Pt had pour kerosene all on her door and in the rug and in the apartment.  How Long Has This Been Causing You Problems? > than 6 months  What Do You Feel Would Help You the Most Today? Treatment for Depression or other mood problem   Have You Recently Had Any Thoughts About Hurting Yourself? Yes  Are You Planning to Commit Suicide/Harm Yourself At This time? No  Have you Recently Had Thoughts About Hurting Someone [name]? Yes  Are You Planning to Harm Someone at This Time? No  Explanation: No data recorded  Have You Used Any Alcohol or Drugs in the Past 24 Hours? Yes  How Long Ago Did You Use Drugs or Alcohol? No data recorded What Did You Use and How Much? No data recorded  Do You Currently Have a Therapist/Psychiatrist? No data recorded Name of Therapist/Psychiatrist: No data recorded  Have You Been Recently Discharged From Any Office Practice or Programs? No data recorded Explanation of Discharge From Practice/Program: No data recorded    CCA Screening Triage Referral Assessment Type of Contact: No data recorded Telemedicine Service Delivery:   Is this Initial or Reassessment? No data recorded Date Telepsych consult ordered in CHL:  No data recorded Time Telepsych consult ordered in CHL:  No  data recorded Location of Assessment: No data recorded Provider Location: No data recorded  Collateral Involvement: No data recorded  Does Patient Have a Court Appointed Legal Guardian? No data recorded Name and Contact of Legal Guardian: No data recorded If Minor and Not Living with Parent(s), Who has Custody? No data recorded Is CPS involved or ever been involved? No data recorded Is APS involved or ever been involved? No data recorded  Patient Determined To Be At Risk for Harm To Self or Others Based on Review of Patient Reported Information or Presenting Complaint? No data recorded Method: No data recorded Availability of Means: No data recorded Intent: No data recorded Notification Required: No data recorded Additional Information for Danger to Others Potential: No data recorded Additional Comments for Danger to Others Potential: No data recorded Are There Guns or Other Weapons in Your Home? No data recorded Types of Guns/Weapons: No data recorded Are These Weapons Safely Secured?                            No data recorded Who Could Verify You Are Able To Have These Secured: No data recorded Do You Have any Outstanding Charges, Pending Court Dates, Parole/Probation? No data recorded Contacted To Inform of Risk of Harm To Self or Others: No data recorded   Does Patient Present under Involuntary Commitment? No data recorded IVC Papers Initial File Date: No data recorded  [place] of Residence: No data recorded  Patient Currently Receiving the Following Services: No data recorded  Determination of Need: Emergent (2 hours)   Options For Referral: Inpatient Hospitalization     CCA Biopsychosocial Patient Reported Schizophrenia/Schizoaffective Diagnosis in Past: Yes   Strengths: Pt cannot identify any.   Mental Health Symptoms Depression:   Worthlessness; Change in energy/activity; Difficulty Concentrating; Fatigue; Hopelessness; Tearfulness; Sleep (too much or  little); Irritability; Increase/decrease in appetite   Duration of Depressive symptoms:    Mania:   None   Anxiety:    Worrying; Tension; Sleep   Psychosis:   Hallucinations   Duration of Psychotic symptoms:  Duration of Psychotic Symptoms: Greater than six months   Trauma:   None   Obsessions:   None   Compulsions:   None   Inattention:   None   Hyperactivity/Impulsivity:   None   Oppositional/Defiant Behaviors:   None   Emotional Irregularity:   Chronic feelings of emptiness   Other Mood/Personality Symptoms:  No data recorded   Mental Status Exam Appearance and self-care  Stature:   Average   Weight:   Average weight   Clothing:  Age-appropriate   Grooming:   Normal   Cosmetic use:   None   Posture/gait:   Normal   Motor activity:   Restless   Sensorium  Attention:   Normal   Concentration:   Preoccupied   Orientation:   X5   Recall/memory:   Normal   Affect and Mood  Affect:   Anxious   Mood:   Anxious; Depressed   Relating  Eye contact:   Normal   Facial expression:   Tense; Anxious   Attitude toward examiner:   Cooperative   Thought and Language  Speech flow:  Clear and Coherent   Thought content:   Appropriate to Mood and Circumstances   Preoccupation:   None   Hallucinations:   Auditory   Organization:  No data recorded  [employer] of Knowledge:   Average   Intelligence:   Average   Abstraction:   Normal   Judgement:   Poor   Reality Testing:   Adequate   Insight:   Lacking   Decision Making:   Impulsive   Social Functioning  Social Maturity:   Impulsive   Social Judgement:   Victimized   Stress  Stressors:   Family conflict; Work   Coping Ability:   Human resources officer Deficits:   Merchant navy officer   Supports:   Family     Religion:    Leisure/Recreation:    Exercise/Diet: Exercise/Diet Have You Gained or Lost A Significant  Amount of Weight in the Past Six Months?: No Do You Have Any Trouble Sleeping?: Yes Explanation of Sleeping Difficulties: <4H/D   CCA Employment/Education Employment/Work Situation: Employment / Work Situation Employment Situation: Unemployed Has Patient ever Been in Equities trader?: No  Education: Education Is Patient Currently Attending School?: No Last Grade Completed: 12 Did You Product manager?:  ("some college")   CCA Family/Childhood History Family and Relationship History: Family history Marital status: Single Does patient have children?: No  Childhood History:  Childhood History By whom was/is the patient raised?: Mother  Child/Adolescent Assessment:     CCA Substance Use Alcohol/Drug Use: Alcohol / Drug Use Prescriptions: Pt says he has not had any psychiatric meds since Summer time. Over the Counter: None History of alcohol / drug use?: Yes Substance #1 Name of Substance 1: ETOH 1 - Age of First Use: Teens 1 - Amount (size/oz): 1-2 beers 1 - Frequency: 1-2 times in a week 1 - Duration: off and on 1 - Last Use / Amount: 10/13 1 - Method of Aquiring: purchase 1- Route of Use: oral Substance #2 Name of Substance 2: Cocaine (powder) 2 - Age of First Use: unknown 2 - Amount (size/oz): Varies 2 - Frequency: 1-2 times a week 2 - Duration: off and on 2 - Last Use / Amount: 10/13 2 - Method of Aquiring: illegal purchase 2 - Route of Substance Use: nasal                     ASAM's:  Six Dimensions of Multidimensional Assessment  Dimension 1:  Acute Intoxication and/or Withdrawal Potential:      Dimension 2:  Biomedical Conditions and Complications:      Dimension 3:  Emotional, Behavioral, or Cognitive Conditions and Complications:     Dimension 4:  Readiness to Change:     Dimension 5:  Relapse, Continued use, or Continued Problem Potential:     Dimension 6:  Recovery/Living Environment:     ASAM Severity Score:  ASAM Recommended Level of  Treatment:     Substance use Disorder (SUD)    Recommendations for Services/Supports/Treatments:    Discharge Disposition:    DSM5 Diagnoses: Patient Active Problem List   Diagnosis Date Noted   Genital warts 05/21/2020   Syphilis 05/21/2020   Cervical strain 09/16/2018   Assessment of effects of psychotropic drug in patient at risk for metabolic syndrome 09/16/2018   Hypertension 10/05/2016   MDD (major depressive disorder), recurrent severe, without psychosis (HCC) 02/25/2016   Cannabis use disorder, severe, dependence (HCC) 02/25/2016   Bipolar disorder (HCC) 06/21/2014   Anxiety 06/21/2014   Cigarette smoker 06/21/2014   HIV disease (HCC) 06/20/2014   Dyslipidemia 06/20/2014     Referrals to Alternative Service(s): Referred to Alternative Service(s):   Place:   Date:   Time:    Referred to Alternative Service(s):   Place:   Date:   Time:    Referred to Alternative Service(s):   Place:   Date:   Time:    Referred to Alternative Service(s):   Place:   Date:   Time:     Wandra Mannan

## 2021-04-18 NOTE — Tx Team (Signed)
Initial Treatment Plan 04/18/2021 3:59 PM Jeffery [name] [id number]    PATIENT STRESSORS: Financial difficulties   Marital or family conflict   Medication change or noncompliance   Occupational concerns     PATIENT STRENGTHS: Ability for insight  Motivation for treatment/growth  Physical Health    PATIENT IDENTIFIED PROBLEMS: "Find placement"  "Quiet the voices down"  Depression  Anxiety  Hallucinations             DISCHARGE CRITERIA:  Ability to meet basic life and health needs Motivation to continue treatment in a less acute level of care Safe-care adequate arrangements made  PRELIMINARY DISCHARGE PLAN: Attend aftercare/continuing care group Outpatient therapy Placement in alternative living arrangements  PATIENT/FAMILY INVOLVEMENT: This treatment plan has been presented to and reviewed with the patient, Jeffery [name], and/or family member.  The patient and family have been given the opportunity to ask questions and make suggestions.  Jeffery Lia Keona Bilyeu, RN 04/18/2021, 3:59 PM
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
## 2021-04-18 NOTE — ED Notes (Signed)
Pt ambulatory, alert, and oriented X4 on and off the unit. Education, support, and encouragement provided. Belongings returned and belongings sheet signed. Pt discharged to lobby to Safe Transport to be transported to [facility] for IP admission.

## 2021-04-18 NOTE — Progress Notes (Signed)
Psychoeducational Group Note  Date:  04/18/2021 Time:  2000  Group Topic/Focus:  Evening AA Meeting  Participation Level: Did Not Attend  Participation Quality:  Not Applicable  Affect:  Not Applicable  Cognitive:  Not Applicable  Insight:  Not Applicable  Engagement in Group: Not Applicable  Additional Comments:  Did not attend.   Marcille Buffy 04/18/2021, 10:00 PM

## 2021-04-18 NOTE — ED Notes (Signed)
Pt given lunch
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
## 2021-04-18 NOTE — ED Notes (Signed)
Report called to [facility] and given to [place], [place]. Transport called.
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
## 2021-04-19 DIAGNOSIS — F251 Schizoaffective disorder, depressive type: Principal | ICD-10-CM

## 2021-04-19 MED ORDER — LOPERAMIDE HCL 2 MG PO CAPS: 2.0000 mg | ORAL_CAPSULE | ORAL | Status: DC | PRN

## 2021-04-19 MED ORDER — HYDROXYZINE HCL 25 MG PO TABS
25.0000 mg | ORAL_TABLET | Freq: Four times a day (QID) | ORAL | Status: DC | PRN
  Administered 2021-04-20 – 2021-04-21 (×2): 25 mg via ORAL
  Filled 2021-04-19 (×2): qty 1

## 2021-04-19 MED ORDER — ONDANSETRON 4 MG PO TBDP: 4.0000 mg | ORAL_TABLET | Freq: Four times a day (QID) | ORAL | Status: DC | PRN

## 2021-04-19 MED ORDER — ADULT MULTIVITAMIN W/MINERALS CH
1.0000 | ORAL_TABLET | Freq: Every day | ORAL | Status: DC
  Administered 2021-04-20 – 2021-04-22 (×3): 1 via ORAL
  Filled 2021-04-19 (×7): qty 1

## 2021-04-19 MED ORDER — LORAZEPAM 1 MG PO TABS
1.0000 mg | ORAL_TABLET | Freq: Four times a day (QID) | ORAL | Status: DC | PRN
  Filled 2021-04-19: qty 1

## 2021-04-19 MED ORDER — ABACAVIR-DOLUTEGRAVIR-LAMIVUD 600-50-300 MG PO TABS
1.0000 | ORAL_TABLET | Freq: Every day | ORAL | Status: DC
  Administered 2021-04-20 – 2021-04-22 (×3): 1 via ORAL
  Filled 2021-04-19 (×6): qty 1

## 2021-04-19 MED ORDER — TRAZODONE HCL 50 MG PO TABS
50.0000 mg | ORAL_TABLET | Freq: Every evening | ORAL | Status: DC | PRN
  Administered 2021-04-19 – 2021-04-21 (×3): 50 mg via ORAL
  Filled 2021-04-19 (×3): qty 1

## 2021-04-19 MED ORDER — THIAMINE HCL 100 MG PO TABS
100.0000 mg | ORAL_TABLET | Freq: Every day | ORAL | Status: DC
  Administered 2021-04-20 – 2021-04-22 (×3): 100 mg via ORAL
  Filled 2021-04-19 (×6): qty 1

## 2021-04-19 NOTE — [facility] Group Notes (Signed)
Date:  04/19/2021 Time:  2:01 PM   Group Topic/Focus:  Emotional Education:   The focus of this group is to discuss what feelings/emotions are, and how they are experienced.   Participation Level:  Active   Participation Quality:  Appropriate   Affect:  Appropriate   Cognitive:  Appropriate   Insight: Appropriate   Engagement in Group:  Engaged   Modes of Intervention:  Education   Additional Comments:  Pt attended group and was very active. 

## 2021-04-19 NOTE — B[facility] Counselor (Signed)
Clinical Social Work Note  CSW attempted to meet with patient to do Psychosocial Assessment but he refused.  Ambrose Mantle, LCSW 04/19/2021, 4:17 PM

## 2021-04-19 NOTE — Progress Notes (Signed)
D: Patient has been in his bed all day. He did get up for meals. He has not attended any groups today. He refused all his medications. He does not admit to any thoughts of self harm. Patient difficult to assess due to unwillingness to participate in his treatment.  A: Continue to monitor medication management and MD orders.  Safety checks completed every 15 minutes per protocol.  Offer support and encouragement as needed.  R: Patient is receptive to staff; he remains isolative and withdrawn.   04/19/21 1800  Psych Admission Type (Psych Patients Only)  Admission Status Voluntary  Psychosocial Assessment  Patient Complaints None  Eye Contact Avoids  Facial Expression Flat  Affect Irritable  Speech Unremarkable  Interaction Avoidant  Motor Activity Other (Comment) (hasn't been up today)  Appearance/Hygiene Unremarkable  Behavior Characteristics Unwilling to participate  Mood Labile  Thought Process  Coherency Circumstantial  Content WDL  Delusions None reported or observed  Perception WDL  Hallucination None reported or observed  Judgment Impaired  Confusion None  Danger to Self  Current suicidal ideation? Denies  Description of Agreement verbal  Danger to Others  Danger to Others None reported or observed  Danger to Others Abnormal  Harmful Behavior to others No threats or harm toward other people

## 2021-04-19 NOTE — [facility] Group Notes (Signed)
Adult Psychoeducational Group Note  Date:  04/19/2021 Time:  10:35 AM  Group Topic/Focus:  Goals Group:   The focus of this group is to help patients establish daily goals to achieve during treatment and discuss how the patient can incorporate goal setting into their daily lives to aide in recovery.  Participation Level:  Did Not Attend  Margaret Pyle 04/19/2021, 10:35 AM

## 2021-04-19 NOTE — [facility] Group Notes (Signed)
Pt did not attend Relaxation Group. 

## 2021-04-19 NOTE — H&P (Signed)
Psychiatric Admission Assessment Adult  Patient Identification: Jeffery [name] MRN:  [id number] Date of Evaluation:  04/19/2021 Chief Complaint:  Schizoaffective disorder (HCC) [F25.9] Principal Diagnosis: Schizoaffective disorder, depressive type (HCC) Diagnosis:  Principal Problem:   Schizoaffective disorder, depressive type (HCC) Active Problems:   HIV disease (HCC)   Dyslipidemia  History of Present Illness: Patient was seen and evaluated, chart reviewed and case discussed with Dr Mason Jim and treatment team. Patient is a 30 year old male who presented to [facility], voluntary via [employer] for a walk-in assessment afte rhe got into a verbal altercation with his mother. He then poured kerosene on the floor and called police on himself. Notes say his mother called police.  Patient has a past psychiatric history significant for Schizoaffective disorder, depression and anxiety. He has a medical history of hypertension, hyperlipidemia and HIV.  Today, patient stated he did pour the kerosene on the floor because he felt that he wasn't being heard and his mother was playing mind games on him. He stated he has been going through a lot and he had no intention of lighting the kerosene. He stated that he and his mother normally get along pretty well but if he doesn't do what she wants, she starts playing mind games. He stated has not taken any mental health medications in over 1 year and this is what his mother keeps getting after him about. He has a history of cocaine abuse, his UDS and BAL are negative but he is not forthcoming with his last use. As a result he is being placed on a CIWA protocol. Marland Kitchen He is vague about what medications he was on and/or any medications that may have worked well in the past. He stated "I am depressed but it is whatever, I just am done with my family and want to go to a shelter when I leave here." The last known medications he was on are Zyprexa and Prozac and these were restarted in  the ED. He is agreeable to take them, however he refused all medications this morning. He has a history of HIV and goes to [facility], he is on Triumeq and Prezcobix. He stated he has been taking his HIV medications. He is vague with answers to all questions, he is not attending group and has been eating his meals in his room. He appears sad and depressed but denies it bothers him. He declined to talk about family history and stated "I don't know what diagnosis I have." He did have a positive RPR titer. I reached out to ID and am awaiting a call back. Will continue to reach out to ID. Patient is isolative and aloof. He has no interest in participating in the therapeutic milieu. He denies SI/HI, paranoia and delusions. He stated he hears voices in the form of negative conversations about him. He stated he sees things in his head but denies these are hallucinations. Patient will be monitored for medication effectiveness and safety. He was able to contract for safety on the unit.  Associated Signs/Symptoms: Depression Symptoms:  depressed mood, anhedonia, insomnia, anxiety, Duration of Depression Symptoms: Greater than two weeks  (Hypo) Manic Symptoms:   None observed or admitted to by patient Anxiety Symptoms:  Excessive Worry, Psychotic Symptoms:   None present  PTSD Symptoms: Patient denies trauma and abuse history Total Time spent with patient: 1 hour  Past Psychiatric History: Schizoaffective disorder, depression, HIV,   Is the patient at risk to self? No.  Has the patient been a risk  to self in the past 6 months? No.  Has the patient been a risk to self within the distant past? No.  Is the patient a risk to others? No.  Has the patient been a risk to others in the past 6 months? No.  Has the patient been a risk to others within the distant past? No.   Prior Inpatient Therapy:  Yes Prior Outpatient Therapy:  Yes  Alcohol Screening:   Substance Abuse History in the last 12 months:  Yes.    Consequences of Substance Abuse: Family discord and risky behavior Previous Psychotropic Medications: Yes  Psychological Evaluations: No  Past Medical History:  Past Medical History:  Diagnosis Date   Anxiety    Chlamydia    Depression    Drug induced constipation    Hemorrhoids    HIV infection (HCC)    Hyperlipidemia    Hypertension    Pt went off meds on his own; has been monitored without any problems   Male-to-male transgender person    former history    Nicotine dependence    Syphilis 2013 and 2015   History reviewed. No pertinent surgical history. 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   Allergies:   Allergies  Allergen Reactions   Bee Venom Swelling and Other (See Comments)    Large local reactions   Lisinopril Swelling and Other (See Comments)    Facial and lip swelling   Lab Results:  Results for orders placed or performed during the hospital encounter of 04/18/21 (from the past 48 hour(s))  Resp Panel by RT-PCR (Flu A&B, Covid) Nasopharyngeal Swab     Status: None   Collection Time: 04/18/21  2:57 AM   Specimen: Nasopharyngeal Swab; Nasopharyngeal(NP) swabs in vial transport medium  Result Value Ref Range   SARS Coronavirus 2 by RT PCR NEGATIVE NEGATIVE    Comment: (NOTE) SARS-CoV-2 target nucleic acids are NOT DETECTED.  The SARS-CoV-2 RNA is generally detectable in upper respiratory specimens during the acute phase of  infection. The lowest concentration of SARS-CoV-2 viral copies this assay can detect is 138 copies/mL. A negative result does not preclude SARS-Cov-2 infection and should not be used as the sole basis for treatment or other patient management decisions. A negative result may occur with  improper specimen collection/handling, submission of specimen other than nasopharyngeal swab, presence of viral mutation(s) within the areas targeted by this assay, and inadequate number of viral copies(<138 copies/mL). A negative result must be combined with clinical observations, patient history, and epidemiological information. The expected result is Negative.  Fact Sheet for Patients:  BloggerCourse.com  Fact Sheet for Healthcare Providers:  SeriousBroker.it  This test is no t yet approved or cleared by the [place] FDA and  has been authorized for detection and/or diagnosis of SARS-CoV-2 by FDA under an Emergency Use Authorization (EUA). This EUA will remain  in effect (meaning this test can  be used) for the duration of the COVID-19 declaration under Section 564(b)(1) of the Act, 21 U.S.C.section 360bbb-3(b)(1), unless the authorization is terminated  or revoked sooner.       Influenza A by PCR NEGATIVE NEGATIVE   Influenza B by PCR NEGATIVE NEGATIVE    Comment: (NOTE) The Xpert Xpress SARS-CoV-2/FLU/RSV plus assay is intended as an aid in the diagnosis of influenza from Nasopharyngeal swab specimens and should not be used as a sole basis for treatment. Nasal washings and aspirates are unacceptable for Xpert Xpress SARS-CoV-2/FLU/RSV testing.  Fact Sheet for Patients: BloggerCourse.com  Fact Sheet for Healthcare Providers: SeriousBroker.it  This test is not yet approved or cleared by the [place] FDA and has been authorized for detection and/or diagnosis of SARS-CoV-2 by FDA  under an Emergency Use Authorization (EUA). This EUA will remain in effect (meaning this test can be used) for the duration of the COVID-19 declaration under Section 564(b)(1) of the Act, 21 U.S.C. section 360bbb-3(b)(1), unless the authorization is terminated or revoked.  Performed at [facility], [address], [place], [place] [address]   POC SARS Coronavirus 2 Ag-ED - Nasal Swab     Status: None (Preliminary result)   Collection Time: 04/18/21  2:58 AM  Result Value Ref Range   SARS Coronavirus 2 Ag Negative Negative  POCT Urine Drug Screen - (ICup)     Status: None (Preliminary result)   Collection Time: 04/18/21  3:08 AM  Result Value Ref Range   POC Amphetamine UR None Detected NONE DETECTED (Cut Off Level 1000 ng/mL)   POC Secobarbital (BAR) None Detected NONE DETECTED (Cut Off Level 300 ng/mL)   POC Buprenorphine (BUP) None Detected NONE DETECTED (Cut Off Level 10 ng/mL)   POC Oxazepam (BZO) None Detected NONE DETECTED (Cut Off Level 300 ng/mL)   POC Cocaine UR None Detected NONE DETECTED (Cut Off Level 300 ng/mL)   POC Methamphetamine UR None Detected NONE DETECTED (Cut Off Level 1000 ng/mL)   POC Morphine None Detected NONE DETECTED (Cut Off Level 300 ng/mL)   POC Oxycodone UR None Detected NONE DETECTED (Cut Off Level 100 ng/mL)   POC Methadone UR None Detected NONE DETECTED (Cut Off Level 300 ng/mL)   POC Marijuana UR None Detected NONE DETECTED (Cut Off Level 50 ng/mL)  CBC with Differential/Platelet     Status: Abnormal   Collection Time: 04/18/21  3:12 AM  Result Value Ref Range   WBC 16.6 (H) 4.0 - 10.5 K/uL   RBC 5.18 4.22 - 5.81 MIL/uL   Hemoglobin 16.6 13.0 - 17.0 g/dL   HCT 19.1 47.8 - 29.5 %   MCV 92.3 80.0 - 100.0 fL   MCH 32.0 26.0 - 34.0 pg   MCHC 34.7 30.0 - 36.0 g/dL   RDW 62.1 30.8 - 65.7 %   Platelets 369 150 - 400 K/uL   nRBC 0.0 0.0 - 0.2 %   Neutrophils Relative % 61 %   Neutro Abs 10.3 (H) 1.7 - 7.7 K/uL   Lymphocytes Relative 27 %    Lymphs Abs 4.5 (H) 0.7 - 4.0 K/uL   Monocytes Relative 9 %   Monocytes Absolute 1.5 (H) 0.1 - 1.0 K/uL   Eosinophils Relative 2 %   Eosinophils Absolute 0.3 0.0 - 0.5 K/uL   Basophils Relative 1 %   Basophils Absolute 0.1 0.0 - 0.1 K/uL   Immature Granulocytes 0 %   Abs Immature Granulocytes 0.07 0.00 - 0.07 K/uL    Comment:  Performed at [facility], [address], [place], [place] [address]  Comprehensive metabolic panel     Status: Abnormal   Collection Time: 04/18/21  3:12 AM  Result Value Ref Range   Sodium 136 135 - 145 mmol/L   Potassium 3.7 3.5 - 5.1 mmol/L   Chloride 99 98 - 111 mmol/L   CO2 23 22 - 32 mmol/L   Glucose, Bld 81 70 - 99 mg/dL    Comment: Glucose reference range applies only to samples taken after fasting for at least 8 hours.   BUN <5 (L) 6 - 20 mg/dL   Creatinine, Ser 7.32 0.61 - 1.24 mg/dL   Calcium 20.2 8.9 - 54.2 mg/dL   Total Protein 7.7 6.5 - 8.1 g/dL   Albumin 4.3 3.5 - 5.0 g/dL   AST 20 15 - 41 U/L   ALT 16 0 - 44 U/L   Alkaline Phosphatase 85 38 - 126 U/L   Total Bilirubin 0.6 0.3 - 1.2 mg/dL   GFR, Estimated >70 >62 mL/min    Comment: (NOTE) Calculated using the CKD-EPI Creatinine Equation (2021)    Anion gap 14 5 - 15    Comment: Performed at [facility], [address], [place], [place] [address]  Hemoglobin A1c     Status: None   Collection Time: 04/18/21  3:12 AM  Result Value Ref Range   Hgb A1c MFr Bld 5.4 4.8 - 5.6 %    Comment: (NOTE) Pre diabetes:          5.7%-6.4%  Diabetes:              >6.4%  Glycemic control for   <7.0% adults with diabetes    Mean Plasma Glucose 108.28 mg/dL    Comment: Performed at [facility], [address], [place], [place] [address]  Ethanol     Status: None   Collection Time: 04/18/21  3:12 AM  Result Value Ref Range   Alcohol, Ethyl (B) <10 <10 mg/dL    Comment: (NOTE) Lowest detectable limit for serum alcohol is 10 mg/dL.  For medical purposes only. Performed at [facility], [address], [place], [place] [address]   TSH     Status: None   Collection Time: 04/18/21  3:12 AM  Result Value Ref Range   TSH 1.132 0.350 - 4.500 uIU/mL    Comment: Performed by a 3rd Generation assay with a functional sensitivity of <=0.01 uIU/mL. Performed at [facility], [address] [place], [place] [address]   RPR     Status: Abnormal   Collection Time: 04/18/21  3:12 AM  Result Value Ref Range   RPR Ser Ql Reactive (A) NON REACTIVE    Comment: SENT FOR CONFIRMATION   RPR Titer 1:8     Comment: Performed at [facility], [address], [place], [place] [address]  Hepatitis panel, acute     Status: None   Collection Time: 04/18/21  3:12 AM  Result Value Ref Range   Hepatitis B Surface Ag NON REACTIVE NON REACTIVE   HCV Ab NON REACTIVE NON REACTIVE    Comment: (NOTE) Nonreactive HCV antibody screen is consistent with no HCV infections,  unless recent infection is suspected or other evidence exists to indicate HCV infection.     Hep A IgM NON REACTIVE NON REACTIVE   Hep B C IgM NON REACTIVE NON REACTIVE    Comment: Performed at [facility], [address], [place], [place] [address]  Lipid panel     Status: Abnormal   Collection Time: 04/18/21  3:12 AM  Result Value Ref Range   Cholesterol 319 (H) 0 - 200 mg/dL   Triglycerides 96 <604 mg/dL   HDL 49 >54 mg/dL   Total CHOL/HDL Ratio 6.5 RATIO   VLDL 19 0 - 40 mg/dL   LDL Cholesterol 098 (H) 0 - 99 mg/dL    Comment:        Total Cholesterol/HDL:CHD Risk Coronary Heart Disease Risk Table                     Men   Women  1/2 Average Risk   3.4   3.3  Average Risk       5.0   4.4  2 X Average Risk   9.6   7.1  3 X Average Risk  23.4   11.0        Use the calculated Patient Ratio above and the CHD Risk Table to determine the patient's CHD Risk.        ATP III CLASSIFICATION (LDL):  <100     mg/dL   Optimal  119-147  mg/dL   Near or Above                    Optimal   130-159  mg/dL   Borderline  829-562  mg/dL   High  >130     mg/dL   Very High Performed at [facility], [address], [place], [place] [address]     Blood Alcohol level:  Lab Results  Component Value Date   [facility] <10 04/18/2021   ETH <10 02/10/2021    Metabolic Disorder Labs:  Lab Results  Component Value Date   HGBA1C 5.4 04/18/2021   MPG 108.28 04/18/2021   MPG 114 12/09/2020   Lab Results  Component Value Date   PROLACTIN 33.4 (H) 02/27/2016   Lab Results  Component Value Date   CHOL 319 (H) 04/18/2021   TRIG 96 04/18/2021   HDL 49 04/18/2021   CHOLHDL 6.5 04/18/2021   VLDL 19 04/18/2021   LDLCALC 251 (H) 04/18/2021   LDLCALC 217 (H) 12/09/2020    Current Medications: Current Facility-Administered Medications  Medication Dose Route Frequency Provider Last Rate Last Admin   abacavir-dolutegravir-lamiVUDine (TRIUMEQ) 600-50-300 MG per tablet 1 tablet  1 tablet Oral Daily Laveda Abbe, NP       acetaminophen (TYLENOL) tablet 650 mg  650 mg Oral Q6H PRN White, Patrice L, NP       alum & mag hydroxide-simeth (MAALOX/MYLANTA) 200-200-20 MG/5ML suspension 30 mL  30 mL Oral Q4H PRN White, Patrice L, NP       amLODipine (NORVASC) tablet 5 mg  5 mg Oral Daily White, Patrice L, NP       atorvastatin (LIPITOR) tablet 40 mg  40 mg Oral Daily White, Patrice L, NP       darunavir-cobicistat (PREZCOBIX) 800-150 MG per tablet 1 tablet  1 tablet Oral Q breakfast White, Patrice L, NP       FLUoxetine (PROZAC) capsule 20 mg  20 mg Oral Daily White, Patrice L, NP       hydrOXYzine (ATARAX/VISTARIL) tablet 25 mg  25 mg Oral Q6H PRN Mason Jim, Amy E, MD       loperamide (IMODIUM) capsule 2-4 mg  2-4 mg Oral PRN Comer Locket, MD  LORazepam (ATIVAN) tablet 1 mg  1 mg Oral Q6H PRN Mason Jim, Amy E, MD       magnesium hydroxide (MILK OF MAGNESIA) suspension 30 mL  30 mL Oral Daily PRN White, Patrice L, NP       multivitamin with minerals tablet 1 tablet  1 tablet  Oral Daily Mason Jim, Amy E, MD       OLANZapine (ZYPREXA) tablet 10 mg  10 mg Oral QHS White, Patrice L, NP   10 mg at 04/18/21 2154   ondansetron (ZOFRAN-ODT) disintegrating tablet 4 mg  4 mg Oral Q6H PRN Comer Locket, MD       [START ON 04/20/2021] thiamine tablet 100 mg  100 mg Oral Daily Mason Jim, Amy E, MD       traZODone (DESYREL) tablet 50 mg  50 mg Oral QHS PRN Laveda Abbe, NP       PTA Medications: Medications Prior to Admission  Medication Sig Dispense Refill Last Dose   PREZCOBIX 800-150 MG tablet TAKE 1 TABLET BY MOUTH EVERY DAY WITH FOOD. SWALLOW WHOLE. DO NOT CRUSH, BREAK, OR CHEW (Patient taking differently: Take 1 tablet by mouth daily. Take with food, swallow whole. Do not crush, break or chew.) 30 tablet 2    TRIUMEQ 600-50-300 MG tablet TAKE 1 TABLET BY MOUTH DAILY 30 tablet 2     Musculoskeletal: Strength & Muscle Tone: within normal limits Gait & Station: normal Patient leans: N/A  Psychiatric Specialty Exam:  Presentation  General Appearance: Appropriate for Environment; Casual  Eye Contact:Fair  Speech:Clear and Coherent  Speech Volume:Normal  Handedness:Right  Mood and Affect  Mood:Depressed; Anxious  Affect:Congruent; Depressed  Thought Process  Thought Processes:Coherent  Duration of Psychotic Symptoms: Greater than six months  Past Diagnosis of Schizophrenia or Psychoactive disorder: Yes  Descriptions of Associations:Intact  Orientation:Full (Time, Place and Person)  Thought Content:Rumination  Hallucinations:Hallucinations: Auditory Description of Auditory Hallucinations: negative conversations about him  Ideas of Reference:None  Suicidal Thoughts:Suicidal Thoughts: No  Homicidal Thoughts:Homicidal Thoughts: No  Sensorium  Memory:Immediate Fair; Recent Fair; Remote Fair  Judgment:Poor  Insight:Fair  Executive Functions  Concentration:Fair  Attention Span:Fair  Recall:Fair  Fund of  Knowledge:Fair  Language:Good  Psychomotor Activity  Psychomotor Activity:Psychomotor Activity: Normal  Assets  Assets:Communication Skills; Desire for Improvement; Housing; Health and safety inspector; Resilience; Social Support  Sleep  Sleep:Sleep: Fair Number of Hours of Sleep: 5  Physical Exam: Physical Exam Vitals and nursing note reviewed.  Constitutional:      Appearance: Normal appearance.  Pulmonary:     Effort: Pulmonary effort is normal.  Musculoskeletal:        General: Normal range of motion.     Cervical back: Normal range of motion.  Neurological:     General: No focal deficit present.     Mental Status: He is alert and oriented to person, place, and time.  Psychiatric:        Attention and Perception: Attention normal.        Mood and Affect: Mood is depressed. Affect is flat.        Speech: Speech normal.        Behavior: Behavior normal.        Thought Content: Thought content normal. Thought content is not paranoid or delusional. Thought content does not include homicidal or suicidal ideation. Thought content does not include homicidal or suicidal plan.        Cognition and Memory: Cognition normal.   Review of Systems  Constitutional: Negative.  Negative  for fever.  HENT: Negative.  Negative for congestion and sore throat.   Respiratory: Negative.  Negative for cough.   Cardiovascular: Negative.   Gastrointestinal: Negative.   Genitourinary: Negative.   Musculoskeletal: Negative.   Neurological: Negative.   Psychiatric/Behavioral:  Positive for depression. The patient has insomnia.    Blood pressure 119/71, pulse 74, temperature (!) 97.5 F (36.4 C), resp. rate 18, height 5\' 7"  (1.702 m), weight 70.3 kg, SpO2 96 %. Body mass index is 24.28 kg/m.  Treatment Plan Summary: Daily contact with patient to assess and evaluate symptoms and progress in treatment and Medication management  Schizoaffective Disorder: Continue Zyprexa 10 mg PO at  bedtime Continue Prozac 20 mg PO daily  HIV: Triumeq 600-50-300 mg tablet PO daily Prezcobix 800-150 mg PO daily with breakfast  Anxiety:  Continue Vistaril 25 mg PO TID PRN  Insomnia:  Initiate Trazodone 50 mg PO at bedtime PRN  Initiate CIWA protocol for suspected alcohol use, patient is not forthcoming about when last drink was Multivitamin PO daily, Zofran 4 mg PO every 6 hours as needed Thiamine 100 mg PO daily Imodium 2-4 mg PO as needed  Medical medications:  Hypertension:  Norvasc 5 mg PO daily  Hyperlipidemia: Lipitor 40 mg PO daily  Other PRN's Vistaril, Trazodone, Tylenol, Mylanta, Milk of Magnesia   Observation Level/Precautions:  15 minute checks  Laboratory: Labs reviewed: CMP with BUN <5, otherwise normal. CBC with diff with WBC 16.6, NEUT# 10.3, Lymphocyte # 4.5, Monocyte # 1.5. A1c 5.4, RPR reactive, Hep A,B and C series negative, UDS and BAL negative, EKG with QTc 434, SR with marked sinus arrhythmia otherwise normal.  Labs ordered: Repeat CBC with Diff  Psychotherapy:  Group Therapy  Medications:  See MAR  Consultations:  TBD  Discharge Concerns:  safety, suicidal ideation, medication compliance  Estimated LOS: 3-5 days  Other:     Physician Treatment Plan for Primary Diagnosis: Schizoaffective disorder, depressive type (HCC) Long Term Goal(s): Improvement in symptoms so as ready for discharge  Short Term Goals: Ability to identify changes in lifestyle to reduce recurrence of condition will improve, Ability to verbalize feelings will improve, Ability to disclose and discuss suicidal ideas, Ability to identify and develop effective coping behaviors will improve, and Ability to identify triggers associated with substance abuse/mental health issues will improve  Physician Treatment Plan for Secondary Diagnosis: Principal Problem:   Schizoaffective disorder, depressive type (HCC) Active Problems:   HIV disease (HCC)   Dyslipidemia  Long Term Goal(s):  Improvement in symptoms so as ready for discharge  Short Term Goals: Ability to identify changes in lifestyle to reduce recurrence of condition will improve, Ability to verbalize feelings will improve, Ability to disclose and discuss suicidal ideas, Ability to identify and develop effective coping behaviors will improve, Compliance with prescribed medications will improve, and Ability to identify triggers associated with substance abuse/mental health issues will improve  I certify that inpatient services furnished can reasonably be expected to improve the patient's condition.    , NP 10/15/20223:39 PM

## 2021-04-19 NOTE — Group Note (Signed)
Group Topic: Goal Setting  Group Date: 04/19/2021 Start Time: 0900 End Time: 1000 Facilitators: Dione Housekeeper, RN  Department: [facility] INPATIENT ADULT [facility]  Number of Participants: 13  Group Focus: affirmation, clarity of thought, and goals/reality orientation Treatment Modality:  Psychoeducation Interventions utilized were assignment, group exercise, and support Purpose: To be able to understand and verbalize the reason for their admission to the hospital. To understand that the medication helps with their chemical imbalance but they also need to work on their choices in life. To be challenged to develop a list of 30 positives about themselves. Also introduce the concept that "feelings" are not reality.  Name: Jeffery [name] Date of Birth: [birth year]  MR: [id number]    Level of Participation: Did not attend  Patients Problems:  Patient Active Problem List   Diagnosis Date Noted   Schizoaffective disorder, depressive type (HCC) 04/18/2021   Genital warts 05/21/2020   Syphilis 05/21/2020   Cervical strain 09/16/2018   Assessment of effects of psychotropic drug in patient at risk for metabolic syndrome 09/16/2018   Hypertension 10/05/2016   MDD (major depressive disorder), recurrent severe, without psychosis (HCC) 02/25/2016   Cannabis use disorder, severe, dependence (HCC) 02/25/2016   Bipolar disorder (HCC) 06/21/2014   Anxiety 06/21/2014   Cigarette smoker 06/21/2014   HIV disease (HCC) 06/20/2014   Dyslipidemia 06/20/2014

## 2021-04-19 NOTE — Progress Notes (Signed)
Psychoeducational Group Note  Date:  04/19/2021 Time:  2000  Group Topic/Focus:  Wrap up group  Participation Level: Did Not Attend  Participation Quality:  Not Applicable  Affect:  Not Applicable  Cognitive:  Not Applicable  Insight:  Not Applicable  Engagement in Group: Not Applicable  Additional Comments:  Did not attend.  Marcille Buffy 04/19/2021, 9:18 PM

## 2021-04-20 LAB — CBC WITH DIFFERENTIAL/PLATELET
Abs Immature Granulocytes: 0.05 10*3/uL (ref 0.00–0.07)
Basophils Absolute: 0.1 10*3/uL (ref 0.0–0.1)
Basophils Relative: 1 %
Eosinophils Absolute: 0.6 10*3/uL — ABNORMAL HIGH (ref 0.0–0.5)
Eosinophils Relative: 5 %
HCT: 46.6 % (ref 39.0–52.0)
Hemoglobin: 15.1 g/dL (ref 13.0–17.0)
Immature Granulocytes: 0 %
Lymphocytes Relative: 36 %
Lymphs Abs: 4.5 10*3/uL — ABNORMAL HIGH (ref 0.7–4.0)
MCH: 31.6 pg (ref 26.0–34.0)
MCHC: 32.4 g/dL (ref 30.0–36.0)
MCV: 97.5 fL (ref 80.0–100.0)
Monocytes Absolute: 0.8 10*3/uL (ref 0.1–1.0)
Monocytes Relative: 7 %
Neutro Abs: 6.5 10*3/uL (ref 1.7–7.7)
Neutrophils Relative %: 51 %
Platelets: 342 10*3/uL (ref 150–400)
RBC: 4.78 MIL/uL (ref 4.22–5.81)
RDW: 13.7 % (ref 11.5–15.5)
WBC: 12.5 10*3/uL — ABNORMAL HIGH (ref 4.0–10.5)
nRBC: 0 % (ref 0.0–0.2)

## 2021-04-20 MED ORDER — LORAZEPAM 1 MG PO TABS
1.0000 mg | ORAL_TABLET | ORAL | Status: AC | PRN
  Administered 2021-04-20: 1 mg via ORAL

## 2021-04-20 MED ORDER — OLANZAPINE 5 MG PO TBDP: 5.0000 mg | ORAL_TABLET | Freq: Three times a day (TID) | ORAL | Status: DC | PRN

## 2021-04-20 MED ORDER — ZIPRASIDONE MESYLATE 20 MG IM SOLR: 20.0000 mg | INTRAMUSCULAR | Status: DC | PRN

## 2021-04-20 NOTE — Group Note (Signed)
Group Topic:  PROGRESSIVE RELAXATION. A group where deep breathing is taught and tensing and relaxation muscle groups is used. Imagery is used as well.  Pts are asked to imagine 3 pillars that hold them up when they are not able to hold themselves up. Group Date: 04/20/2021 Start Time: 0900 End Time: 1000 Facilitators: Dione Housekeeper, RN  Department: [facility] INPATIENT ADULT [facility]  Number of Participants: 16  Group Focus: anxiety and clarity of thought Treatment Modality:  Cognitive Behavioral Therapy, Skills Training, and Spiritual Interventions utilized were exploration and support Purpose: enhance coping skills and reinforce self-care  Name: Jeffery [name] Date of Birth: 1990-09-05  MR: [id number]    Level of Participation: Did not attend Patients Problems:  Patient Active Problem List   Diagnosis Date Noted   Schizoaffective disorder, depressive type (HCC) 04/18/2021   Genital warts 05/21/2020   Syphilis 05/21/2020   Cervical strain 09/16/2018   Assessment of effects of psychotropic drug in patient at risk for metabolic syndrome 09/16/2018   Hypertension 10/05/2016   MDD (major depressive disorder), recurrent severe, without psychosis (HCC) 02/25/2016   Cannabis use disorder, severe, dependence (HCC) 02/25/2016   Bipolar disorder (HCC) 06/21/2014   Anxiety 06/21/2014   Cigarette smoker 06/21/2014   HIV disease (HCC) 06/20/2014   Dyslipidemia 06/20/2014
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
## 2021-04-20 NOTE — Group Note (Signed)
  [facility] LCSW Group Therapy Note  Date/Time:  04/20/2021 10:00AM-11:00AM  Type of Therapy and Topic:  Group Therapy:  Self-Care Wheel  Participation Level:  Did Not Attend   Description of Group This process group involved patients discussing the importance of self-care in different areas of life (professional, personal, emotional, psychological, spiritual, and physical) in order to achieve healthy life balance.  The group talked about what self-care in each of those areas would constitute and then specifically listed how they want to provide themselves with improved self-care.  Therapeutic Goals Patient will learn how to break self-care down into various areas of life Patient will participate in generating ideas about healthy self-care options in each category Patients will be supportive of one another and receive support from others Patient will identify one healthy self-care activity to add to his/her life   Summary of Patient Progress:  The patient  was invited to group, did not attend.  Therapeutic Modalities Processing Psychoeducation   Ambrose Mantle, Alexander Mt 04/20/2021 2:33 PM

## 2021-04-20 NOTE — [facility] Group Notes (Signed)
Pt did not attend the morning group with the nurse. 

## 2021-04-20 NOTE — [facility] Suicide Risk Assessment (Signed)
[facility] INPATIENT:  Family/Significant Other Suicide Prevention Education  Suicide Prevention Education:  Patient Refusal for Family/Significant Other Suicide Prevention Education: The patient Jeffery [name] has refused to provide written consent for family/significant other to be provided Family/Significant Other Suicide Prevention Education during admission and/or prior to discharge.  Physician notified.  Kynlee Koenigsberg A Latrena Benegas 04/20/2021, 10:47 AM

## 2021-04-20 NOTE — Progress Notes (Signed)
   04/20/21 2140  Psych Admission Type (Psych Patients Only)  Admission Status Voluntary  Psychosocial Assessment  Patient Complaints None  Eye Contact Brief;Avoids  Facial Expression Flat  Affect Blunted  Speech Logical/coherent;Soft  Interaction Guarded;Minimal  Motor Activity Slow  Appearance/Hygiene Unremarkable  Behavior Characteristics Cooperative;Appropriate to situation  Mood Pleasant  Thought Process  Coherency Circumstantial  Content WDL  Delusions None reported or observed  Perception Hallucinations  Hallucination Auditory  Judgment Poor  Confusion None  Danger to Self  Current suicidal ideation? Denies  Self-Injurious Behavior No self-injurious ideation or behavior indicators observed or expressed   Agreement Not to Harm Self Yes  Description of Agreement Verbal contract  Danger to Others  Danger to Others None reported or observed
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
## 2021-04-20 NOTE — [facility] Group Notes (Signed)
Pt was notified but did not attend group. 

## 2021-04-20 NOTE — Group Note (Addendum)
Late Entry for 101/15/2022  Clinical Social Work Note  No group could be held this day due to low staffing.  Other licensed group was held.  Ambrose Mantle, LCSW 04/19/2021
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
## 2021-04-20 NOTE — [facility] Group Notes (Signed)
Pt did not attend the relaxation group this afternoon.  

## 2021-04-21 ENCOUNTER — Encounter (HOSPITAL_COMMUNITY): Payer: Self-pay

## 2021-04-21 LAB — T.PALLIDUM AB, TOTAL: T Pallidum Abs: REACTIVE — AB

## 2021-04-21 NOTE — Progress Notes (Signed)
Pt kept to himself much of the evening, pt was a little irritable earlier in the evening, but became more pleasant during conversation with Clinical research associate.     04/21/21 2300  Psych Admission Type (Psych Patients Only)  Admission Status Voluntary  Psychosocial Assessment  Patient Complaints Anxiety;Irritability  Eye Contact Brief;Avoids  Facial Expression Flat  Affect Blunted  Speech Logical/coherent;Soft  Interaction Guarded;Minimal  Motor Activity Slow  Appearance/Hygiene Unremarkable  Behavior Characteristics Anxious;Calm  Mood Anxious;Pleasant  Thought Process  Coherency WDL  Content WDL  Delusions None reported or observed  Perception WDL  Hallucination None reported or observed  Judgment Poor  Confusion None  Danger to Self  Current suicidal ideation? Denies  Self-Injurious Behavior No self-injurious ideation or behavior indicators observed or expressed   Agreement Not to Harm Self Yes  Description of Agreement Verbal contract  Danger to Others  Danger to Others None reported or observed

## 2021-04-21 NOTE — Progress Notes (Signed)
Pt signed 72 hour request for discharge form at 1203.

## 2021-04-21 NOTE — Group Note (Signed)
LCSW Group Therapy Note   Group Date: 04/21/2021 Start Time: 1300 End Time: 1400   Type of Therapy and Topic:  Group Therapy: Challenging Core Beliefs  Participation Level:  Did not attend  Description of Group:  Patients were educated about core beliefs and asked to identify one harmful core belief that they have. Patients were asked to explore from where those beliefs originate. Patients were asked to discuss how those beliefs make them feel and the resulting behaviors of those beliefs. They were then be asked if those beliefs are true and, if so, what evidence they have to support them. Lastly, group members were challenged to replace those negative core beliefs with helpful beliefs.   Therapeutic Goals:   1. Patient will identify harmful core beliefs and explore the origins of such beliefs. 2. Patient will identify feelings and behaviors that result from those core beliefs. 3. Patient will discuss whether such beliefs are true. 4.  Patient will replace harmful core beliefs with helpful ones.  Summary of Patient Progress:  Vasil did not attend group.    Therapeutic Modalities: Cognitive Behavioral Therapy; Solution-Focused Therapy   Gardiner Sleeper Adelle Zachar, LCSWA 04/21/2021  1:56 PM

## 2021-04-21 NOTE — Group Note (Signed)
Recreation Therapy Group Note   Group Topic:Stress Management  Group Date: 04/21/2021 Start Time: 0935 End Time: 0950 Facilitators: [name], LRT/CTRS Location: [address] Dayroom  Goal Area(s) Addresses:  Patient will actively participate in stress management techniques presented during session.  Patient will successfully identify benefit of practicing stress management post d/c.   Group Description: Guided Imagery. LRT provided education, instruction, and demonstration on practice of visualization via guided imagery. Patient was asked to participate in the technique introduced during session. LRT debriefed including topics of mindfulness, stress management and specific scenarios each patient could use these techniques. Patients were given suggestions of ways to access scripts post d/c and encouraged to explore Youtube and other apps available on smartphones, tablets, and computers.   Affect/Mood: N/A   Participation Level: Did not attend    Clinical Observations/Individualized Feedback: Pt did not attend group.    Plan: Continue to engage patient in RT group sessions 2-3x/week.   [name], LRT/CTRS 04/21/2021 1:01 PM

## 2021-04-21 NOTE — BH IP Treatment Plan (Signed)
Interdisciplinary Treatment and Diagnostic Plan Update  04/21/2021 Time of Session: 2:50pm Mcgwire Dasaro MRN: [id number]  Principal Diagnosis: Schizoaffective disorder, depressive type (HCC)  Secondary Diagnoses: Principal Problem:   Schizoaffective disorder, depressive type (HCC) Active Problems:   HIV disease (HCC)   Dyslipidemia   Current Medications:  Current Facility-Administered Medications  Medication Dose Route Frequency Provider Last Rate Last Admin   abacavir-dolutegravir-lamiVUDine (TRIUMEQ) 600-50-300 MG per tablet 1 tablet  1 tablet Oral Daily Laveda Abbe, NP   1 tablet at 04/21/21 1201   acetaminophen (TYLENOL) tablet 650 mg  650 mg Oral Q6H PRN White, Patrice L, NP       alum & mag hydroxide-simeth (MAALOX/MYLANTA) 200-200-20 MG/5ML suspension 30 mL  30 mL Oral Q4H PRN White, Patrice L, NP       amLODipine (NORVASC) tablet 5 mg  5 mg Oral Daily White, Patrice L, NP   5 mg at 04/21/21 0753   atorvastatin (LIPITOR) tablet 40 mg  40 mg Oral Daily White, Patrice L, NP   40 mg at 04/21/21 0753   darunavir-cobicistat (PREZCOBIX) 800-150 MG per tablet 1 tablet  1 tablet Oral Q breakfast White, Patrice L, NP   1 tablet at 04/21/21 0753   FLUoxetine (PROZAC) capsule 20 mg  20 mg Oral Daily White, Patrice L, NP   20 mg at 04/21/21 0753   hydrOXYzine (ATARAX/VISTARIL) tablet 25 mg  25 mg Oral Q6H PRN Comer Locket, MD   25 mg at 04/20/21 2140   loperamide (IMODIUM) capsule 2-4 mg  2-4 mg Oral PRN Comer Locket, MD       LORazepam (ATIVAN) tablet 1 mg  1 mg Oral Q6H PRN Mason Jim, Amy E, MD       magnesium hydroxide (MILK OF MAGNESIA) suspension 30 mL  30 mL Oral Daily PRN White, Patrice L, NP       multivitamin with minerals tablet 1 tablet  1 tablet Oral Daily Mason Jim, Amy E, MD   1 tablet at 04/21/21 0753   OLANZapine (ZYPREXA) tablet 10 mg  10 mg Oral QHS White, Patrice L, NP   10 mg at 04/20/21 2140   OLANZapine zydis (ZYPREXA) disintegrating tablet 5 mg  5  mg Oral Q8H PRN Park Pope, MD       And   ziprasidone (GEODON) injection 20 mg  20 mg Intramuscular PRN Park Pope, MD       ondansetron (ZOFRAN-ODT) disintegrating tablet 4 mg  4 mg Oral Q6H PRN Mason Jim, Amy E, MD       thiamine tablet 100 mg  100 mg Oral Daily Mason Jim, Amy E, MD   100 mg at 04/21/21 0753   traZODone (DESYREL) tablet 50 mg  50 mg Oral QHS PRN Laveda Abbe, NP   50 mg at 04/20/21 2140   PTA Medications: Medications Prior to Admission  Medication Sig Dispense Refill Last Dose   PREZCOBIX 800-150 MG tablet TAKE 1 TABLET BY MOUTH EVERY DAY WITH FOOD. SWALLOW WHOLE. DO NOT CRUSH, BREAK, OR CHEW (Patient taking differently: Take 1 tablet by mouth daily. Take with food, swallow whole. Do not crush, break or chew.) 30 tablet 2    TRIUMEQ 600-50-300 MG tablet TAKE 1 TABLET BY MOUTH DAILY 30 tablet 2     Patient Stressors: Financial difficulties   Marital or family conflict   Medication change or noncompliance   Occupational concerns    Patient Strengths: Ability for insight  Motivation for treatment/growth  Physical Health  Treatment Modalities: Medication Management, Group therapy, Case management,  1 to 1 session with clinician, Psychoeducation, Recreational therapy.   Physician Treatment Plan for Primary Diagnosis: Schizoaffective disorder, depressive type (HCC) Long Term Goal(s): Improvement in symptoms so as ready for discharge   Short Term Goals: Ability to identify changes in lifestyle to reduce recurrence of condition will improve Ability to verbalize feelings will improve Ability to disclose and discuss suicidal ideas Ability to identify and develop effective coping behaviors will improve Compliance with prescribed medications will improve Ability to identify triggers associated with substance abuse/mental health issues will improve  Medication Management: Evaluate patient's response, side effects, and tolerance of medication  regimen.  Therapeutic Interventions: 1 to 1 sessions, Unit Group sessions and Medication administration.  Evaluation of Outcomes: Progressing  Physician Treatment Plan for Secondary Diagnosis: Principal Problem:   Schizoaffective disorder, depressive type (HCC) Active Problems:   HIV disease (HCC)   Dyslipidemia  Long Term Goal(s): Improvement in symptoms so as ready for discharge   Short Term Goals: Ability to identify changes in lifestyle to reduce recurrence of condition will improve Ability to verbalize feelings will improve Ability to disclose and discuss suicidal ideas Ability to identify and develop effective coping behaviors will improve Compliance with prescribed medications will improve Ability to identify triggers associated with substance abuse/mental health issues will improve     Medication Management: Evaluate patient's response, side effects, and tolerance of medication regimen.  Therapeutic Interventions: 1 to 1 sessions, Unit Group sessions and Medication administration.  Evaluation of Outcomes: Progressing   RN Treatment Plan for Primary Diagnosis: Schizoaffective disorder, depressive type (HCC) Long Term Goal(s): Knowledge of disease and therapeutic regimen to maintain health will improve  Short Term Goals: Ability to remain free from injury will improve, Ability to verbalize frustration and anger appropriately will improve, Ability to demonstrate self-control, Ability to identify and develop effective coping behaviors will improve, and Compliance with prescribed medications will improve  Medication Management: RN will administer medications as ordered by provider, will assess and evaluate patient's response and provide education to patient for prescribed medication. RN will report any adverse and/or side effects to prescribing provider.  Therapeutic Interventions: 1 on 1 counseling sessions, Psychoeducation, Medication administration, Evaluate responses to  treatment, Monitor vital signs and CBGs as ordered, Perform/monitor CIWA, COWS, AIMS and Fall Risk screenings as ordered, Perform wound care treatments as ordered.  Evaluation of Outcomes: Progressing   LCSW Treatment Plan for Primary Diagnosis: Schizoaffective disorder, depressive type (HCC) Long Term Goal(s): Safe transition to appropriate next level of care at discharge, Engage patient in therapeutic group addressing interpersonal concerns.  Short Term Goals: Engage patient in aftercare planning with referrals and resources, Increase social support, Increase ability to appropriately verbalize feelings, Identify triggers associated with mental health/substance abuse issues, and Increase skills for wellness and recovery  Therapeutic Interventions: Assess for all discharge needs, 1 to 1 time with Social worker, Explore available resources and support systems, Assess for adequacy in community support network, Educate family and significant other(s) on suicide prevention, Complete Psychosocial Assessment, Interpersonal group therapy.  Evaluation of Outcomes: Progressing   Progress in Treatment: Attending groups: No. Participating in groups: No. Taking medication as prescribed: Yes. Toleration medication: Yes. Family/Significant other contact made: No, will contact:  declined consents Patient understands diagnosis: No. Discussing patient identified problems/goals with staff: Yes. Medical problems stabilized or resolved: Yes. Denies suicidal/homicidal ideation: Yes. Issues/concerns per patient self-inventory: No.   New problem(s) identified: No, Describe:  none  New Short Term/Long  Term Goal(s): detox, medication management for mood stabilization; elimination of SI thoughts; development of comprehensive mental wellness/sobriety plan  Patient Goals:  "Get outpatient therapy"  Discharge Plan or Barriers: Patient recently admitted. CSW will continue to follow and assess for appropriate  referrals and possible discharge planning.    Reason for Continuation of Hospitalization: Aggression Medication stabilization Suicidal ideation  Estimated Length of Stay: 1-3 days   Scribe for Treatment Team: Otelia Santee, LCSW 04/21/2021 3:36 PM

## 2021-04-21 NOTE — Group Note (Signed)
Occupational Therapy Group Note  Group Topic:Communication  Group Date: 04/21/2021 Start Time: 1400 End Time: 1445 Facilitators: Ersa Delaney, OT/L   Group Description: Group encouraged increased engagement and participation through discussion focused on communication styles. Patients were educated on the different styles of communication including passive, aggressive, assertive, and passive-aggressive communication. Group members shared and reflected on which styles they most often find themselves communicating in and brainstormed strategies on how to transition and practice a more assertive approach. Further discussion explored how to use assertiveness skills and strategies to further advocate and ask questions as it relates to their treatment plan and mental health.   Therapeutic Goal(s): Identify practical strategies to improve communication skills  Identify how to use assertive communication skills to address individual needs and wants   Participation Level: OT Group not held d/t high volume of individual OT/PT orders that needed to be seen.    Plan: Continue to engage patient in OT groups 2 - 3x/week.  04/21/2021  Rickey Farrier, OT/L   

## 2021-04-21 NOTE — Progress Notes (Signed)
Pt presents with irritability and says his goal is "to get out of the hospital."  Pt took medications without incident and no adverse reactions were noted.  RN established rapport with pt and assessed for needs or concerns.  Pt remains safe in the unit with q 15 min checks in place.

## 2021-04-21 NOTE — [facility] Counselor (Signed)
CSW met with pt and gave him resources for homeless shelters that he requested. Pt was lying in bed and never opened his eyes but did ask CSW when he would discharge. CSW told pt to speak with his doctors about this matter when they meet with him. Pt agreed understanding.   Toney Reil, [place] Worker [employer]

## 2021-04-21 NOTE — Progress Notes (Signed)
[facility] MD Progress Note  04/21/2021 3:25 PM Jeffery [name]  MRN:  [id number]  Subjective:  "I am not talking about anything, I just want to be discharged"   Objective: Patient is a 30 year old male who presented to [facility], voluntary via [employer] for a walk-in assessment afte rhe got into a verbal altercation with his mother. He then poured kerosene on the floor and called police on himself. Notes say his mother called police.  Patient has a past psychiatric history significant for Schizoaffective disorder, depression and anxiety. He has a medical history of hypertension, hyperlipidemia and HIV.   Evaluation on the unit, today 10/17: Patient was seen and evaluated with Dr Mason Jim, chart reviewed and case discussed with the treatment team. Patient continues to be argumentative in regards to his treatment plan and follow up. He stated he slept well last night, record shows he slept 6.75 hours. He reported a good appetite. He has not participated in group therapy or been visible in the milieu. He stated he will go to a shelter because he does not want to live with his mother. He takes no ownership in the argument with his mother or the actions that led to him being hospitalized.  He denied SI/HI/AVH, paranoia and delusions. He does not appear to be responding to internal stimuli.   We discussed discharge tomorrow if he can hold himself together today and has no further outbursts. We discussed his RPR results and let him know we are awaiting the results of the titer to determine if he needs further treatment. His RPR titer result is 1:8 and will require no further treatment (see note below from [facility] MD).  We will continue to monitor for safety and behavior.   Secure chat message received from Dr Earlene Plater with [facility] at 10:41 today: it looks like in Nov 2021 his RPR was 1:16 and he was adequately treated.  His current titer is 1:8 so currently does not need re-treatment.  Just need to continue monitoring his RPR to  ensure that it continues to decrease.  Principal Problem: Schizoaffective disorder, depressive type (HCC) Diagnosis: Principal Problem:   Schizoaffective disorder, depressive type (HCC) Active Problems:   HIV disease (HCC)   Dyslipidemia  Total Time spent with patient:  15 minutes  Past Psychiatric History: See H&P  Past Medical History:  Past Medical History:  Diagnosis Date   Anxiety    Chlamydia    Depression    Drug induced constipation    Hemorrhoids    HIV infection (HCC)    Hyperlipidemia    Hypertension    Pt went off meds on his own; has been monitored without any problems   Male-to-male transgender person    former history    Nicotine dependence    Syphilis 2013 and 2015   History reviewed. No pertinent surgical history. Family History:  Family History  Problem Relation Age of Onset   Hypertension Father    Hypertension Maternal Grandmother    Family Psychiatric  History: See H&P Social History:  Social History   Substance and Sexual Activity  Alcohol Use Yes   Alcohol/week: 0.0 standard drinks   Comment: occasional     Social History   Substance and Sexual Activity  Drug Use Yes   Frequency: 3.0 times per week   Types: Marijuana    Social History   Socioeconomic History   Marital status: Single    Spouse name: Not on file   Number of children: Not on file  Years of education: Not on file   Highest education level: Not on file  Occupational History   Occupation: restraunt    Employer: [employer]  Tobacco Use   Smoking status: Light Smoker    Packs/day: 0.25    Types: Cigarettes   Smokeless tobacco: Never   Tobacco comments:    5 cigarettes a day  Vaping Use   Vaping Use: Never used  Substance and Sexual Activity   Alcohol use: Yes    Alcohol/week: 0.0 standard drinks    Comment: occasional   Drug use: Yes    Frequency: 3.0 times per week    Types: Marijuana   Sexual activity: Not Currently    Partners: Male    Comment: given  condoms  Other Topics Concern   Not on file  Social History Narrative   Not on file   Social Determinants of Health   Financial Resource Strain: Not on file  Food Insecurity: Not on file  Transportation Needs: Not on file  Physical Activity: Not on file  Stress: Not on file  Social Connections: Not on file   Additional Social History:      Sleep: Good  Appetite:  Good  Current Medications: Current Facility-Administered Medications  Medication Dose Route Frequency Provider Last Rate Last Admin   abacavir-dolutegravir-lamiVUDine (TRIUMEQ) 600-50-300 MG per tablet 1 tablet  1 tablet Oral Daily Laveda Abbe, NP   1 tablet at 04/21/21 1201   acetaminophen (TYLENOL) tablet 650 mg  650 mg Oral Q6H PRN White, Patrice L, NP       alum & mag hydroxide-simeth (MAALOX/MYLANTA) 200-200-20 MG/5ML suspension 30 mL  30 mL Oral Q4H PRN White, Patrice L, NP       amLODipine (NORVASC) tablet 5 mg  5 mg Oral Daily White, Patrice L, NP   5 mg at 04/21/21 0753   atorvastatin (LIPITOR) tablet 40 mg  40 mg Oral Daily White, Patrice L, NP   40 mg at 04/21/21 0753   darunavir-cobicistat (PREZCOBIX) 800-150 MG per tablet 1 tablet  1 tablet Oral Q breakfast White, Patrice L, NP   1 tablet at 04/21/21 0753   FLUoxetine (PROZAC) capsule 20 mg  20 mg Oral Daily White, Patrice L, NP   20 mg at 04/21/21 0753   hydrOXYzine (ATARAX/VISTARIL) tablet 25 mg  25 mg Oral Q6H PRN Comer Locket, MD   25 mg at 04/20/21 2140   loperamide (IMODIUM) capsule 2-4 mg  2-4 mg Oral PRN Comer Locket, MD       LORazepam (ATIVAN) tablet 1 mg  1 mg Oral Q6H PRN Mason Jim, Amy E, MD       magnesium hydroxide (MILK OF MAGNESIA) suspension 30 mL  30 mL Oral Daily PRN White, Patrice L, NP       multivitamin with minerals tablet 1 tablet  1 tablet Oral Daily Mason Jim, Amy E, MD   1 tablet at 04/21/21 0753   OLANZapine (ZYPREXA) tablet 10 mg  10 mg Oral QHS White, Patrice L, NP   10 mg at 04/20/21 2140   OLANZapine zydis  (ZYPREXA) disintegrating tablet 5 mg  5 mg Oral Q8H PRN Park Pope, MD       And   ziprasidone (GEODON) injection 20 mg  20 mg Intramuscular PRN Park Pope, MD       ondansetron (ZOFRAN-ODT) disintegrating tablet 4 mg  4 mg Oral Q6H PRN Comer Locket, MD       thiamine tablet 100 mg  100 mg Oral Daily Bartholomew Crews E, MD   100 mg at 04/21/21 0753   traZODone (DESYREL) tablet 50 mg  50 mg Oral QHS PRN Laveda Abbe, NP   50 mg at 04/20/21 2140    Lab Results:  Results for orders placed or performed during the hospital encounter of 04/18/21 (from the past 48 hour(s))  CBC with Differential/Platelet     Status: Abnormal   Collection Time: 04/20/21  6:50 PM  Result Value Ref Range   WBC 12.5 (H) 4.0 - 10.5 K/uL   RBC 4.78 4.22 - 5.81 MIL/uL   Hemoglobin 15.1 13.0 - 17.0 g/dL   HCT 31.5 40.0 - 86.7 %   MCV 97.5 80.0 - 100.0 fL   MCH 31.6 26.0 - 34.0 pg   MCHC 32.4 30.0 - 36.0 g/dL   RDW 61.9 50.9 - 32.6 %   Platelets 342 150 - 400 K/uL   nRBC 0.0 0.0 - 0.2 %   Neutrophils Relative % 51 %   Neutro Abs 6.5 1.7 - 7.7 K/uL   Lymphocytes Relative 36 %   Lymphs Abs 4.5 (H) 0.7 - 4.0 K/uL   Monocytes Relative 7 %   Monocytes Absolute 0.8 0.1 - 1.0 K/uL   Eosinophils Relative 5 %   Eosinophils Absolute 0.6 (H) 0.0 - 0.5 K/uL   Basophils Relative 1 %   Basophils Absolute 0.1 0.0 - 0.1 K/uL   Immature Granulocytes 0 %   Abs Immature Granulocytes 0.05 0.00 - 0.07 K/uL    Comment: Performed at [facility], [address], [place], [place] [address]    Blood Alcohol level:  Lab Results  Component Value Date   ETH <10 04/18/2021   ETH <10 02/10/2021    Metabolic Disorder Labs: Lab Results  Component Value Date   HGBA1C 5.4 04/18/2021   MPG 108.28 04/18/2021   MPG 114 12/09/2020   Lab Results  Component Value Date   PROLACTIN 33.4 (H) 02/27/2016   Lab Results  Component Value Date   CHOL 319 (H) 04/18/2021   TRIG 96 04/18/2021   HDL 49  04/18/2021   CHOLHDL 6.5 04/18/2021   VLDL 19 04/18/2021   LDLCALC 251 (H) 04/18/2021   LDLCALC 217 (H) 12/09/2020    Physical Findings: AIMS: Facial and Oral Movements Muscles of Facial Expression: None, normal Lips and Perioral Area: None, normal Jaw: None, normal Tongue: None, normal,Extremity Movements Upper (arms, wrists, hands, fingers): None, normal Lower (legs, knees, ankles, toes): None, normal, Trunk Movements Neck, shoulders, hips: None, normal, Overall Severity Severity of abnormal movements (highest score from questions above): None, normal Incapacitation due to abnormal movements: None, normal Patient's awareness of abnormal movements (rate only patient's report): No Awareness, Dental Status Current problems with teeth and/or dentures?: No Does patient usually wear dentures?: No  CIWA:  CIWA-Ar Total: 1 COWS:     Musculoskeletal: Strength & Muscle Tone: within normal limits Gait & Station: normal Patient leans: N/A  Psychiatric Specialty Exam:  Presentation  General Appearance: Appropriate for Environment; Casual  Eye Contact:Fair  Speech:Clear and Coherent  Speech Volume:Normal  Handedness:Right   Mood and Affect  Mood:Irritable  Affect:Congruent   Thought Process  Thought Processes:Coherent; Goal Directed  Descriptions of Associations:Intact  Orientation:Full (Time, Place and Person)  Thought Content:Rumination  History of Schizophrenia/Schizoaffective disorder:Yes  Duration of Psychotic Symptoms:Greater than six months  Hallucinations:No data recorded  Ideas of Reference:None  Suicidal Thoughts:No data recorded  Homicidal Thoughts:No data recorded  Sensorium  Memory:Immediate Fair; Recent Fair; Remote Fair  Judgment:Fair  Insight:Fair  Executive Functions  Concentration:Fair  Attention Span:Fair  Recall:Fair  [employer] of Knowledge:Fair  Language:Good  Psychomotor Activity  Psychomotor Activity:No data  recorded  Assets  Assets:Communication Skills; Desire for Improvement; Housing; Health and safety inspector; Resilience; Social Support  Sleep  Sleep:No data recorded  Physical Exam: Physical Exam Vitals and nursing note reviewed.  Constitutional:      Appearance: Normal appearance.  HENT:     Head: Normocephalic.  Pulmonary:     Effort: Pulmonary effort is normal.  Musculoskeletal:        General: Normal range of motion.     Cervical back: Normal range of motion.  Neurological:     General: No focal deficit present.     Mental Status: He is alert and oriented to person, place, and time.  Psychiatric:        Attention and Perception: Attention normal. He does not perceive auditory or visual hallucinations.        Mood and Affect: Mood is depressed.        Speech: Speech normal.        Behavior: Behavior is uncooperative.        Thought Content: Thought content is not paranoid or delusional. Thought content does not include homicidal or suicidal ideation. Thought content does not include homicidal or suicidal plan.        Cognition and Memory: Cognition normal.   Review of Systems  Constitutional: Negative.  Negative for fever.  HENT: Negative.  Negative for congestion.   Respiratory: Negative.  Negative for cough.   Cardiovascular: Negative.  Negative for chest pain.  Genitourinary: Negative.   Neurological: Negative.    Blood pressure (!) 138/98, pulse 85, temperature 98.1 F (36.7 C), temperature source Oral, resp. rate 18, height 5\' 7"  (1.702 m), weight 70.3 kg, SpO2 98 %. Body mass index is 24.28 kg/m.   Treatment Plan Summary: Daily contact with patient to assess and evaluate symptoms and progress in treatment and Medication management  Labs ordered: CBC w/diff, T.pallidum Ab, total  Schizoaffective Disorder: Continue Zyprexa 10 mg PO at bedtime Continue Prozac 20 mg PO daily   HIV: Triumeq 600-50-300 mg tablet PO daily Prezcobix 800-150 mg PO daily with  breakfast  RPR positive with previous h/o treated syphilis - RPR + - T pallidum Ab total pending, Titer result 1:8  Secure chat message received from Dr with [facility] at 10:41 today: it looks like in Nov 2021 his RPR was 1:16 and he was adequately treated. His current titer is 1:8 so currently does not need re-treatment.  Just need to continue monitoring his RPR to ensure that it continues to decrease.   Anxiety:  Continue Vistaril 25 mg PO TID PRN   Insomnia:  Initiate Trazodone 50 mg PO at bedtime PRN   Initiate CIWA protocol for suspected alcohol use, patient is not forthcoming about when last drink was Multivitamin PO daily, Zofran 4 mg PO every 6 hours as needed Thiamine 100 mg PO daily Imodium 2-4 mg PO as needed   Medical medications:  Hypertension:  Norvasc 5 mg PO daily  Hyperlipidemia: Lipitor 40 mg PO daily   Other PRN's Vistaril, Trazodone, Tylenol, Mylanta, Milk of Magnesia    Dec 2021, NP 04/21/2021, 3:27 PM

## 2021-04-21 NOTE — Progress Notes (Signed)
Psychoeducational Group Note  Date:  04/21/2021 Time:  2154  Group Topic/Focus:  Wrap-Up Group:   The focus of this group is to help patients review their daily goal of treatment and discuss progress on daily workbooks.  Participation Level: Did Not Attend  Participation Quality:  Not Applicable  Affect:  Not Applicable  Cognitive:  Not Applicable  Insight:  Not Applicable  Engagement in Group: Not Applicable  Additional Comments:  The patient did not attend group this evening.   Hazle Coca S 04/21/2021, 9:54 PM

## 2021-04-22 MED ORDER — TRIUMEQ 600-50-300 MG PO TABS
1.0000 | ORAL_TABLET | Freq: Every day | ORAL | 0 refills | Status: DC
Start: 2021-04-22 — End: 2021-04-29

## 2021-04-22 MED ORDER — PREZCOBIX 800-150 MG PO TABS
ORAL_TABLET | ORAL | 0 refills | Status: DC
Start: 2021-04-22 — End: 2021-04-29

## 2021-04-22 MED ORDER — ATORVASTATIN CALCIUM 40 MG PO TABS: 40.0000 mg | ORAL_TABLET | Freq: Every day | ORAL | 0 refills | Status: DC

## 2021-04-22 MED ORDER — PREZCOBIX 800-150 MG PO TABS: ORAL_TABLET | ORAL | 0 refills | Status: DC

## 2021-04-22 MED ORDER — FLUOXETINE HCL 20 MG PO CAPS: 20.0000 mg | ORAL_CAPSULE | Freq: Every day | ORAL | 0 refills | Status: DC

## 2021-04-22 MED ORDER — AMLODIPINE BESYLATE 5 MG PO TABS: 5.0000 mg | ORAL_TABLET | Freq: Every day | ORAL | 0 refills | Status: DC

## 2021-04-22 MED ORDER — STRESS FORMULA/ZINC PO TABS
1.0000 | ORAL_TABLET | Freq: Every day | ORAL | 0 refills | Status: DC
Start: 2021-04-22 — End: 2021-04-22

## 2021-04-22 MED ORDER — HYDROXYZINE HCL 25 MG PO TABS: 25.0000 mg | ORAL_TABLET | Freq: Four times a day (QID) | ORAL | 0 refills | Status: DC | PRN

## 2021-04-22 MED ORDER — TRAZODONE HCL 50 MG PO TABS: 50.0000 mg | ORAL_TABLET | Freq: Every evening | ORAL | 0 refills | Status: DC | PRN

## 2021-04-22 MED ORDER — AMLODIPINE BESYLATE 5 MG PO TABS
5.0000 mg | ORAL_TABLET | Freq: Every day | ORAL | 0 refills | Status: DC
Start: 2021-04-22 — End: 2021-04-22

## 2021-04-22 MED ORDER — TRIUMEQ 600-50-300 MG PO TABS: 1.0000 | ORAL_TABLET | Freq: Every day | ORAL | 0 refills | Status: DC

## 2021-04-22 MED ORDER — STRESS FORMULA/ZINC PO TABS
1.0000 | ORAL_TABLET | Freq: Every day | ORAL | 0 refills | Status: DC
Start: 2021-04-22 — End: 2022-06-26

## 2021-04-22 NOTE — Progress Notes (Signed)
  [facility] Adult Case Management Discharge Plan :  Will you be returning to the same living situation after discharge:  No. shelter At discharge, do you have transportation home?: Yes,  Safe Transport to mother's home where personal car is Do you have the ability to pay for your medications: Yes,  medicare  Release of information consent forms completed and in the chart;  Patient's signature needed at discharge.  Patient to Follow up at:  Follow-up Information     [facility] Follow up.   Specialty: Behavioral Health Why: You may go to this facility Monday- Wednesday 7:45am-11am for therapy and medication management services. These appointments are first come, first serve so please arrive early. Contact information: [address] [place] [address] [phone]                Next level of care provider has access to [facility] Link:yes  Safety Planning and Suicide Prevention discussed: Yes,  w/ pt     Has patient been referred to the Quitline?: Patient refused referral  Patient has been referred for addiction treatment: N/A  Felizardo Hoffmann, LCSWA 04/22/2021, 10:20 AM

## 2021-04-22 NOTE — Care Management Important Message (Signed)
Medicare IM printed for social work to be given to the patient. 

## 2021-04-22 NOTE — BHH Suicide Risk Assessment (Signed)
[facility] Discharge Suicide Risk Assessment   Principal Problem: Schizoaffective disorder, depressive type (HCC) Discharge Diagnoses: Principal Problem:   Schizoaffective disorder, depressive type (HCC) Active Problems:   HIV disease (HCC)   Dyslipidemia   Total Time spent with patient: 30 minutes  Musculoskeletal: Strength & Muscle Tone: within normal limits Gait & Station: normal Patient leans: N/A  Psychiatric Specialty Exam  Presentation  General Appearance: Appropriate for Environment; Casual  Eye Contact:Fair  Speech:Clear and Coherent  Speech Volume:Normal  Handedness:Right   Mood and Affect  Mood:Irritable  Duration of Depression Symptoms: Greater than two weeks  Affect:Congruent   Thought Process  Thought Processes:Coherent; Goal Directed  Descriptions of Associations:Intact  Orientation:Full (Time, Place and Person)  Thought Content:Rumination  History of Schizophrenia/Schizoaffective disorder:Yes  Duration of Psychotic Symptoms:Greater than six months  Hallucinations:No data recorded Ideas of Reference:None  Suicidal Thoughts:No data recorded Homicidal Thoughts:No data recorded  Sensorium  Memory:Immediate Fair; Recent Fair; Remote Fair  Judgment:Fair  Insight:Fair   Executive Functions  Concentration:Fair  Attention Span:Fair  Recall:Fair  Fund of Knowledge:Fair  Language:Good   Psychomotor Activity  Psychomotor Activity: No data recorded  Assets  Assets:Communication Skills; Desire for Improvement; Housing; Health and safety inspector; Resilience; Social Support   Sleep  Sleep: No data recorded  Physical Exam: Physical Exam Vitals and nursing note reviewed.  Constitutional:      Appearance: Normal appearance.  HENT:     Head: Normocephalic and atraumatic.     Nose: Nose normal.  Eyes:     Extraocular Movements: Extraocular movements intact.  Cardiovascular:     Rate and Rhythm: Normal rate.  Pulmonary:      Effort: Pulmonary effort is normal.  Musculoskeletal:        General: Normal range of motion.     Cervical back: Normal range of motion.  Neurological:     General: No focal deficit present.     Mental Status: He is alert and oriented to person, place, and time.  Psychiatric:        Mood and Affect: Mood normal.        Behavior: Behavior normal.   Review of Systems  Constitutional:  Negative for fever.  Respiratory:  Negative for cough.   Cardiovascular:  Negative for chest pain.  Skin:  Negative for rash.  Neurological:  Negative for dizziness and headaches.  Psychiatric/Behavioral:  Negative for hallucinations and suicidal ideas.   Blood pressure 122/71, pulse 83, temperature 98.1 F (36.7 C), temperature source Oral, resp. rate 18, height 5\' 7"  (1.702 m), weight 70.3 kg, SpO2 100 %. Body mass index is 24.28 kg/m.  Mental Status Per Nursing Assessment::   On Admission:  NA  Demographic Factors:  Male and Low socioeconomic status  Loss Factors: Decrease in vocational status and Financial problems/change in socioeconomic status  Historical Factors: Substance use prior to admit  Risk Reduction Factors:   NA  Continued Clinical Symptoms:  Alcohol/Substance Abuse/Dependencies  Cognitive Features That Contribute To Risk:  Polarized thinking    Suicide Risk:  Minimal: No identifiable suicidal ideation.  Patients presenting with no risk factors but with morbid ruminations; may be classified as minimal risk based on the severity of the depressive symptoms   Follow-up Information     [facility] Follow up.   Specialty: Behavioral Health Why: You may go to this facility Monday- Wednesday 7:45am-11am for therapy and medication management services. These appointments are first come, first serve so please arrive early. Contact information: [address]  [place] [address] [phone]                Plan Of Care/Follow-up  recommendations:  Activity:  as tolerated Diet:  cardiac Other:  follow up with HIV clinic, resume HIV medications.   Prescriptions for new medications provided for the patient to bridge to follow up appointment. The patient was informed that refills for these prescriptions are generally not provided, and patient is encouraged to attend all follow up appointments to address medication refills and adjustments.   Today's discharge was reviewed with treatment team, and the team is in agreement that the patient is ready for discharge. The patient is was of the discharge plan for today and has been given opportunity to ask questions. At time of discharge, the patient does not vocalize any acute harm to self or others, is goal directed, able to advocate for self and organizational baseline.   At discharge, the patient is instructed to:  Take all medications as prescribed. Report any adverse effects and or reactions from the medicines to her outpatient provider promptly.  Do not engage in alcohol and/or illegal drug use while on prescription medicines.  In the event of worsening symptoms, patient is instructed to call the crisis hotline, 911 and or go to the nearest ED for appropriate evaluation and treatment of symptoms.  Follow-up with primary care provider for further care of medical issues, concerns and or health care needs.   Roselle Locus, MD 04/22/2021, 12:15 PM

## 2021-04-22 NOTE — Progress Notes (Signed)
D: Pt A & O X 4. Denies SI, HI, AVH and pain at this time. D/C home as ordered. Picked up by [employer].  A: D/C instructions reviewed with pt including printed prescriptions and follow up appointments; compliance encouraged. All belongings from locker returned to pt at time of departure. Scheduled medications given with verbal education and effects monitored. Safety checks maintained without incident till time of d/c.  R: Pt receptive to care. Compliant with medications when offered. Denies adverse drug reactions when assessed. Verbalized understanding related to d/c instructions. Signed belonging sheet in agreement with items received from locker. Ambulatory with a steady gait. Appears to be in no physical distress at time of departure.

## 2021-04-22 NOTE — Care Management Important Message (Signed)
CSW gave Medicare IM to pt.   Fredirick Lathe, LCSWA Clinicial Social Worker [employer]

## 2021-04-22 NOTE — BHH Group Notes (Signed)
The focus of this group is to help patients establish daily goals to achieve during treatment and discuss how the patient can incorporate goal setting into their daily lives to aide in recovery.  Pt did not attend morning goals group.  
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
## 2021-04-23 ENCOUNTER — Telehealth (HOSPITAL_COMMUNITY): Payer: Self-pay | Admitting: Internal Medicine

## 2021-04-23 NOTE — BH Assessment (Signed)
Care Management - Follow Up [facility] Discharges   Writer attempted to make contact with patient today and was unsuccessful.  Writer left a HIPPA compliant voice message.   Per chart review, patient was placed inpatient at [facility] on 04-18-21.   

## 2021-04-29 ENCOUNTER — Ambulatory Visit (INDEPENDENT_AMBULATORY_CARE_PROVIDER_SITE_OTHER): Payer: Medicare Other | Admitting: Internal Medicine

## 2021-04-29 ENCOUNTER — Other Ambulatory Visit: Payer: Self-pay

## 2021-04-29 VITALS — BP 145/76 | HR 88 | Resp 16 | Ht 67.0 in | Wt 158.0 lb

## 2021-04-29 DIAGNOSIS — Z23 Encounter for immunization: Secondary | ICD-10-CM

## 2021-04-29 DIAGNOSIS — A539 Syphilis, unspecified: Secondary | ICD-10-CM | POA: Diagnosis not present

## 2021-04-29 DIAGNOSIS — B20 Human immunodeficiency virus [HIV] disease: Secondary | ICD-10-CM

## 2021-04-29 DIAGNOSIS — F3132 Bipolar disorder, current episode depressed, moderate: Secondary | ICD-10-CM | POA: Diagnosis not present

## 2021-04-29 MED ORDER — TRIUMEQ 600-50-300 MG PO TABS: 1.0000 | ORAL_TABLET | Freq: Every day | ORAL | 11 refills | Status: DC

## 2021-04-29 MED ORDER — PREZCOBIX 800-150 MG PO TABS: ORAL_TABLET | ORAL | 11 refills | Status: DC

## 2021-04-29 NOTE — Assessment & Plan Note (Signed)
I encouraged him to call behavioral health today to arrange follow-up.

## 2021-04-29 NOTE — Assessment & Plan Note (Signed)
His RPR is down from 1:16 to 1:8 after retreatment for late latent syphilis 1 year ago.  He does not have evidence of clinical syphilis at this time.  I believe that his RPR is serofast and that he does not need retreatment now.
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
## 2021-04-29 NOTE — Assessment & Plan Note (Signed)
His infection has been under excellent, long-term control.  He will continue Triumeq and Prezcobix and get repeat lab work today.  He will follow-up in 1 year.  He received his influenza vaccine today.  He has not taken a COVID-vaccine yet.  I strongly encouraged him to strongly consider starting a COVID-vaccine series soon.

## 2021-04-30 LAB — T-HELPER CELL (CD4) - (RCID CLINIC ONLY)
CD4 % Helper T Cell: 32 % — ABNORMAL LOW (ref 33–65)
CD4 T Cell Abs: 1040 /uL (ref 400–1790)

## 2021-05-01 LAB — COMPREHENSIVE METABOLIC PANEL
AG Ratio: 1.3 (calc) (ref 1.0–2.5)
ALT: 14 U/L (ref 9–46)
AST: 13 U/L (ref 10–40)
Albumin: 4.2 g/dL (ref 3.6–5.1)
Alkaline phosphatase (APISO): 80 U/L (ref 36–130)
BUN: 8 mg/dL (ref 7–25)
CO2: 24 mmol/L (ref 20–32)
Calcium: 9.7 mg/dL (ref 8.6–10.3)
Chloride: 102 mmol/L (ref 98–110)
Creat: 0.93 mg/dL (ref 0.60–1.26)
Globulin: 3.3 g/dL (calc) (ref 1.9–3.7)
Glucose, Bld: 78 mg/dL (ref 65–99)
Potassium: 4 mmol/L (ref 3.5–5.3)
Sodium: 137 mmol/L (ref 135–146)
Total Bilirubin: 0.5 mg/dL (ref 0.2–1.2)
Total Protein: 7.5 g/dL (ref 6.1–8.1)

## 2021-05-01 LAB — CBC
HCT: 44.9 % (ref 38.5–50.0)
Hemoglobin: 15.5 g/dL (ref 13.2–17.1)
MCH: 32.4 pg (ref 27.0–33.0)
MCHC: 34.5 g/dL (ref 32.0–36.0)
MCV: 93.9 fL (ref 80.0–100.0)
MPV: 10.5 fL (ref 7.5–12.5)
Platelets: 354 10*3/uL (ref 140–400)
RBC: 4.78 10*6/uL (ref 4.20–5.80)
RDW: 13.2 % (ref 11.0–15.0)
WBC: 13 10*3/uL — ABNORMAL HIGH (ref 3.8–10.8)

## 2021-05-01 LAB — FLUORESCENT TREPONEMAL AB(FTA)-IGG-BLD: Fluorescent Treponemal ABS: REACTIVE — AB

## 2021-05-01 LAB — HIV-1 RNA QUANT-NO REFLEX-BLD
HIV 1 RNA Quant: NOT DETECTED Copies/mL
HIV-1 RNA Quant, Log: NOT DETECTED Log cps/mL

## 2021-05-01 LAB — RPR: RPR Ser Ql: REACTIVE — AB

## 2021-05-01 LAB — RPR TITER: RPR Titer: 1:8 {titer} — ABNORMAL HIGH

## 2021-11-27 ENCOUNTER — Encounter (HOSPITAL_COMMUNITY): Payer: Self-pay | Admitting: Emergency Medicine

## 2021-11-27 ENCOUNTER — Ambulatory Visit (HOSPITAL_COMMUNITY)
Admission: EM | Admit: 2021-11-27 | Discharge: 2021-11-27 | Disposition: A | Discharge status: Home / Self Care | Payer: Medicare Other | Attending: Internal Medicine | Admitting: Internal Medicine

## 2021-11-27 DIAGNOSIS — Z20822 Contact with and (suspected) exposure to covid-19: Secondary | ICD-10-CM | POA: Diagnosis not present

## 2021-11-27 DIAGNOSIS — B349 Viral infection, unspecified: Secondary | ICD-10-CM | POA: Diagnosis present

## 2021-11-27 DIAGNOSIS — J029 Acute pharyngitis, unspecified: Secondary | ICD-10-CM | POA: Diagnosis present

## 2021-11-27 LAB — POCT RAPID STREP A, ED / UC: Streptococcus, Group A Screen (Direct): NEGATIVE

## 2021-11-27 LAB — SARS CORONAVIRUS 2 (TAT 6-24 HRS): SARS Coronavirus 2: NEGATIVE

## 2021-11-27 MED ORDER — BENZONATATE 100 MG PO CAPS: 100.0000 mg | ORAL_CAPSULE | Freq: Three times a day (TID) | ORAL | 0 refills | Status: DC | PRN

## 2021-11-27 NOTE — ED Triage Notes (Signed)
Pt is present today with cough, chills , body aches, fever, nasal congestion, and abdominal pain. Pt sx started x2 days ago

## 2021-11-27 NOTE — Discharge Instructions (Signed)
It appears that you have a viral upper respiratory infection that should run its course and self resolve in the next few days with symptomatic treatment.  Your rapid strep test was negative.  Throat culture and COVID test are pending.  We will call if they are positive.  Also recommend Vicks VapoRub and humidifiers to help alleviate symptoms.  Please follow-up if symptoms persist or worsen.

## 2021-11-27 NOTE — ED Provider Notes (Addendum)
[facility]    CSN: [id number] Arrival date & time: 11/27/21  0850      History   Chief Complaint Chief Complaint  Patient presents with   Cough   Abdominal Pain   Fever   Chills   Generalized Body Aches    HPI Jeffery [name] is a 31 y.o. male.   Patient presents with cough, nasal congestion, fever, generalized body aches, chills, stomachache, sore throat that started approximately 2 days ago.  Denies any known sick contacts but states that he works at [employer] and may have been exposed to someone.  Patient not sure of Tmax at home but states that he felt feverish.  Patient has not taken any medications to help alleviate symptoms.  Denies chest pain, shortness of breath, nausea, vomiting, diarrhea.  Stomachache is generalized per patient.  Having normal bowel movements.  Denies blood in stool.   Cough Abdominal Pain Fever  Past Medical History:  Diagnosis Date   Anxiety    Chlamydia    Depression    Drug induced constipation    Hemorrhoids    HIV infection (HCC)    Hyperlipidemia    Hypertension    Pt went off meds on his own; has been monitored without any problems   Male-to-male transgender person    former history    Nicotine dependence    Syphilis 2013 and 2015    Patient Active Problem List   Diagnosis Date Noted   Schizoaffective disorder, depressive type (HCC) 04/18/2021   Genital warts 05/21/2020   Syphilis 05/21/2020   Cervical strain 09/16/2018   Assessment of effects of psychotropic drug in patient at risk for metabolic syndrome 09/16/2018   Hypertension 10/05/2016   MDD (major depressive disorder), recurrent severe, without psychosis (HCC) 02/25/2016   Cannabis use disorder, severe, dependence (HCC) 02/25/2016   Bipolar disorder (HCC) 06/21/2014   Anxiety 06/21/2014   Cigarette smoker 06/21/2014   HIV disease (HCC) 06/20/2014   Dyslipidemia 06/20/2014    History reviewed. No pertinent surgical history.     Home  Medications    Prior to Admission medications   Medication Sig Start Date End Date Taking? Authorizing Provider  benzonatate (TESSALON) 100 MG capsule Take 1 capsule (100 mg total) by mouth every 8 (eight) hours as needed for cough. 11/27/21  Yes , Rolly Salter E, FNP  abacavir-dolutegravir-lamiVUDine (TRIUMEQ) 600-50-300 MG tablet Take 1 tablet by mouth daily. 04/29/21   Cliffton Asters, MD  amLODipine (NORVASC) 5 MG tablet Take 1 tablet (5 mg total) by mouth daily. 04/22/21   Roselle Locus, MD  atorvastatin (LIPITOR) 40 MG tablet Take 1 tablet (40 mg total) by mouth daily. 04/22/21   Roselle Locus, MD  darunavir-cobicistat (PREZCOBIX) 800-150 MG tablet Swallow whole. Do NOT crush, break or chew tablets. Take with food. 04/29/21   Cliffton Asters, MD  FLUoxetine (PROZAC) 20 MG capsule Take 1 capsule (20 mg total) by mouth daily. 04/22/21   Roselle Locus, MD  hydrOXYzine (ATARAX/VISTARIL) 25 MG tablet Take 1 tablet (25 mg total) by mouth every 6 (six) hours as needed (anxiety/agitation or CIWA < or = 10). 04/22/21   Roselle Locus, MD  Multiple Vitamins-Minerals (B COMPLEX-C-E-ZINC) tablet Take 1 tablet by mouth daily. 04/22/21   Hill, Shelbie Hutching, MD  OLANZapine (ZYPREXA) 10 MG tablet Take 10 mg by mouth at bedtime. 11/10/21   [provider]  traZODone (DESYREL) 50 MG tablet Take 1 tablet (50 mg total) by mouth at bedtime  as needed for sleep. 04/22/21   Roselle LocusHill, Stephanie Leigh, MD    Family History Family History  Problem Relation Age of Onset   Hypertension Father    Hypertension Maternal Grandmother     Social History Social History   Tobacco Use   Smoking status: Light Smoker    Packs/day: 0.25    Types: Cigarettes   Smokeless tobacco: Never   Tobacco comments:    5 cigarettes a day  Vaping Use   Vaping Use: Never used  Substance Use Topics   Alcohol use: Yes    Alcohol/week: 0.0 standard drinks    Comment: occasional   Drug use: Yes     Frequency: 3.0 times per week    Types: Marijuana     Allergies   Bee venom and Lisinopril   Review of Systems Review of Systems Per HPI  Physical Exam Triage Vital Signs ED Triage Vitals  Enc Vitals Group     BP 11/27/21 0940 (!) 159/120     Pulse Rate 11/27/21 0940 79     Resp 11/27/21 0940 18     Temp 11/27/21 0940 98.2 F (36.8 C)     Temp src --      SpO2 11/27/21 0940 95 %     Weight --      Height --      Head Circumference --      Peak Flow --      Pain Score 11/27/21 0938 8     Pain Loc --      Pain Edu? --      Excl. in GC? --    No data found.  Updated Vital Signs BP (!) 159/120   Pulse 79   Temp 98.2 F (36.8 C)   Resp 18   SpO2 95%   Visual Acuity Right Eye Distance:   Left Eye Distance:   Bilateral Distance:    Right Eye Near:   Left Eye Near:    Bilateral Near:     Physical Exam Constitutional:      General: He is not in acute distress.    Appearance: Normal appearance. He is not toxic-appearing or diaphoretic.  HENT:     Head: Normocephalic and atraumatic.     Right Ear: Tympanic membrane and ear canal normal.     Left Ear: Tympanic membrane and ear canal normal.     Nose: Congestion present.     Mouth/Throat:     Mouth: Mucous membranes are moist.     Pharynx: Posterior oropharyngeal erythema present.  Eyes:     Extraocular Movements: Extraocular movements intact.     Conjunctiva/sclera: Conjunctivae normal.     Pupils: Pupils are equal, round, and reactive to light.  Cardiovascular:     Rate and Rhythm: Normal rate and regular rhythm.     Pulses: Normal pulses.     Heart sounds: Normal heart sounds.  Pulmonary:     Effort: Pulmonary effort is normal. No respiratory distress.     Breath sounds: Normal breath sounds. No stridor. No wheezing, rhonchi or rales.  Abdominal:     General: Abdomen is flat. Bowel sounds are normal. There is no distension.     Palpations: Abdomen is soft.     Tenderness: There is no abdominal  tenderness.  Musculoskeletal:        General: Normal range of motion.     Cervical back: Normal range of motion.  Skin:    General: Skin is warm and dry.  Neurological:     General: No focal deficit present.     Mental Status: He is alert and oriented to person, place, and time. Mental status is at baseline.  Psychiatric:        Mood and Affect: Mood normal.        Behavior: Behavior normal.     UC Treatments / Results  Labs (all labs ordered are listed, but only abnormal results are displayed) Labs Reviewed  CULTURE, GROUP A STREP ([facility])  SARS CORONAVIRUS 2 (TAT 6-24 HRS)  POCT RAPID STREP A, ED / UC    EKG   Radiology No results found.  Procedures Procedures (including critical care time)  Medications Ordered in UC Medications - No data to display  Initial Impression / Assessment and Plan / UC Course  I have reviewed the triage vital signs and the nursing notes.  Pertinent labs & imaging results that were available during my care of the patient were reviewed by me and considered in my medical decision making (see chart for details).     Patient presents with symptoms likely from a viral upper respiratory infection. Differential includes bacterial pneumonia, sinusitis, allergic rhinitis, COVID-19, flu. Do not suspect underlying cardiopulmonary process. Symptoms seem unlikely related to ACS, CHF or COPD exacerbations, pneumonia, pneumothorax. Patient is nontoxic appearing and not in need of emergent medical intervention.  Rapid strep was negative.  Throat culture and COVID test pending.  Recommended symptom control with over the counter medications.  Discussed supportive care with patient.  Return if symptoms fail to improve in 1-2 weeks or you develop shortness of breath, chest pain, severe headache. Patient states understanding and is agreeable.  Discharged with PCP followup.   Blood pressure slightly elevated.  It appears consistent with previous values.  No  signs of hypertensive urgency on exam and no associated symptoms so patient to follow-up with PCP for this. Final Clinical Impressions(s) / UC Diagnoses   Final diagnoses:  Viral illness  Sore throat     Discharge Instructions      It appears that you have a viral upper respiratory infection that should run its course and self resolve in the next few days with symptomatic treatment.  Your rapid strep test was negative.  Throat culture and COVID test are pending.  We will call if they are positive.  Also recommend Vicks VapoRub and humidifiers to help alleviate symptoms.  Please follow-up if symptoms persist or worsen.      ED Prescriptions     Medication Sig Dispense Auth. Provider   benzonatate (TESSALON) 100 MG capsule Take 1 capsule (100 mg total) by mouth every 8 (eight) hours as needed for cough. 21 capsule [place], Acie Fredrickson, [place]      PDMP not reviewed this encounter.   Gustavus Bryant, [place] 11/27/21 1018    [address] [place] 11/27/21 1019

## 2021-11-28 ENCOUNTER — Telehealth (HOSPITAL_COMMUNITY): Payer: Self-pay | Admitting: Emergency Medicine

## 2021-11-28 LAB — CULTURE, GROUP A STREP (THRC)

## 2021-11-28 MED ORDER — AMOXICILLIN 500 MG PO CAPS: 500.0000 mg | ORAL_CAPSULE | Freq: Two times a day (BID) | ORAL | 0 refills | Status: AC

## 2022-04-28 ENCOUNTER — Ambulatory Visit (INDEPENDENT_AMBULATORY_CARE_PROVIDER_SITE_OTHER): Payer: Medicare Other

## 2022-04-28 ENCOUNTER — Ambulatory Visit (INDEPENDENT_AMBULATORY_CARE_PROVIDER_SITE_OTHER): Payer: Medicare Other | Admitting: Internal Medicine

## 2022-04-28 ENCOUNTER — Other Ambulatory Visit: Payer: Self-pay

## 2022-04-28 VITALS — BP 132/83 | HR 75 | Temp 98.2°F | Resp 16 | Ht 67.0 in | Wt 165.2 lb

## 2022-04-28 DIAGNOSIS — Z23 Encounter for immunization: Secondary | ICD-10-CM

## 2022-04-28 DIAGNOSIS — A539 Syphilis, unspecified: Secondary | ICD-10-CM | POA: Diagnosis not present

## 2022-04-28 DIAGNOSIS — A630 Anogenital (venereal) warts: Secondary | ICD-10-CM

## 2022-04-28 DIAGNOSIS — B20 Human immunodeficiency virus [HIV] disease: Secondary | ICD-10-CM

## 2022-04-28 DIAGNOSIS — F332 Major depressive disorder, recurrent severe without psychotic features: Secondary | ICD-10-CM

## 2022-04-28 MED ORDER — IMIQUIMOD 5 % EX CREA: TOPICAL_CREAM | CUTANEOUS | 2 refills | Status: DC

## 2022-04-28 NOTE — Assessment & Plan Note (Signed)
His infection has been under excellent control on his current salvage regimen.  I was planning on simplifying his regimen to [facility] today but he says that he wants to consider injectable antiretrovirals.  I had him meet with Dr. Claris Gower to discuss this option.  He will get updated lab work today.  He received an influenza vaccine and an updated COVID vaccine here today.

## 2022-04-28 NOTE — Progress Notes (Signed)
Patient Active Problem List   Diagnosis Date Noted   HIV disease (HCC) 06/20/2014    Priority: High   Schizoaffective disorder, depressive type (HCC) 04/18/2021   Genital warts 05/21/2020   Syphilis 05/21/2020   Cervical strain 09/16/2018   Assessment of effects of psychotropic drug in patient at risk for metabolic syndrome 09/16/2018   Hypertension 10/05/2016   MDD (major depressive disorder), recurrent severe, without psychosis (HCC) 02/25/2016   Cannabis use disorder, severe, dependence (HCC) 02/25/2016   Bipolar disorder (HCC) 06/21/2014   Anxiety 06/21/2014   Cigarette smoker 06/21/2014   Dyslipidemia 06/20/2014    Patient's Medications  New Prescriptions   IMIQUIMOD (ALDARA) 5 % CREAM    Apply topically 3 (three) times a week.  Previous Medications   ABACAVIR-DOLUTEGRAVIR-LAMIVUDINE (TRIUMEQ) 600-50-300 MG TABLET    Take 1 tablet by mouth daily.   AMLODIPINE (NORVASC) 5 MG TABLET    Take 1 tablet (5 mg total) by mouth daily.   ATORVASTATIN (LIPITOR) 40 MG TABLET    Take 1 tablet (40 mg total) by mouth daily.   BENZONATATE (TESSALON) 100 MG CAPSULE    Take 1 capsule (100 mg total) by mouth every 8 (eight) hours as needed for cough.   DARUNAVIR-COBICISTAT (PREZCOBIX) 800-150 MG TABLET    Swallow whole. Do NOT crush, break or chew tablets. Take with food.   ESCITALOPRAM (LEXAPRO) 20 MG TABLET    Take 20 mg by mouth daily.   FLUOXETINE (PROZAC) 20 MG CAPSULE    Take 1 capsule (20 mg total) by mouth daily.   HYDROXYZINE (ATARAX/VISTARIL) 25 MG TABLET    Take 1 tablet (25 mg total) by mouth every 6 (six) hours as needed (anxiety/agitation or CIWA < or = 10).   MULTIPLE VITAMINS-MINERALS (B COMPLEX-C-E-ZINC) TABLET    Take 1 tablet by mouth daily.   OLANZAPINE (ZYPREXA) 10 MG TABLET    Take 10 mg by mouth at bedtime.   TRAZODONE (DESYREL) 50 MG TABLET    Take 1 tablet (50 mg total) by mouth at bedtime as needed for sleep.  Modified Medications   No medications on file   Discontinued Medications   No medications on file    Subjective: Jeffery [name] is in for his routine HIV follow-up visit.  He denies any problems obtaining, taking or tolerating his Triumeq or Prezcobix.  He says that he rarely misses but describes having some pill fatigue.  He says he wants to start Cabenuva injections.  He says that he likes the idea of not having the pills laying out around his house for others to see.  He says that his chronic anxiety and depression are largely unchanged.  He has been taking his mental health medications and he regularly goes to [place].  He is not getting any regular exercise.  He is noted what he believes is another perianal wart.  It developed about 3 months ago.  It has not changed in size.  Sometimes will cause a little bit of itching.  Review of Systems: Review of Systems  Constitutional:  Negative for fever and weight loss.  Skin:  Positive for itching.  Psychiatric/Behavioral:  Positive for depression. The patient is nervous/anxious.     Past Medical History:  Diagnosis Date   Anxiety    Chlamydia    Depression    Drug induced constipation    Hemorrhoids    HIV infection (HCC)    Hyperlipidemia    Hypertension  Pt went off meds on his own; has been monitored without any problems   Male-to-male transgender person    former history    Nicotine dependence    Syphilis 2013 and 2015    Social History   Tobacco Use   Smoking status: Light Smoker    Packs/day: 0.25    Types: Cigarettes   Smokeless tobacco: Never   Tobacco comments:    5 cigarettes a day  Vaping Use   Vaping Use: Never used  Substance Use Topics   Alcohol use: Yes    Alcohol/week: 0.0 standard drinks of alcohol    Comment: occasional   Drug use: Yes    Frequency: 3.0 times per week    Types: Marijuana    Family History  Problem Relation Age of Onset   Hypertension Father    Hypertension Maternal Grandmother     Allergies  Allergen Reactions   Bee Venom  Swelling and Other (See Comments)    Large local reactions   Lisinopril Swelling and Other (See Comments)    Facial and lip swelling    Health Maintenance  Topic Date Due   Medicare Annual Wellness (AWV)  Never done   COVID-19 Vaccine (1) Never done   HPV VACCINES (3 - Risk male 3-dose series) 11/20/2015   TETANUS/TDAP  06/16/2026   INFLUENZA VACCINE  Completed   Hepatitis C Screening  Completed   HIV Screening  Completed    Objective:  Vitals:   04/28/22 1347  BP: 132/83  Pulse: 75  Resp: 16  Temp: 98.2 F (36.8 C)  TempSrc: Oral  SpO2: 98%  Weight: 165 lb 3.2 oz (74.9 kg)  Height: 5\' 7"  (1.702 m)   Body mass index is 25.87 kg/m.  Physical Exam Constitutional:      Comments: He is in good spirits.  Cardiovascular:     Rate and Rhythm: Normal rate and regular rhythm.     Heart sounds: No murmur heard. Pulmonary:     Effort: Pulmonary effort is normal.     Breath sounds: Normal breath sounds.  Abdominal:     Comments: He has a small exophytic wart in the 3 o'clock position around his anus.  Psychiatric:        Mood and Affect: Mood normal.     Lab Results Lab Results  Component Value Date   WBC 13.0 (H) 04/29/2021   HGB 15.5 04/29/2021   HCT 44.9 04/29/2021   MCV 93.9 04/29/2021   PLT 354 04/29/2021    Lab Results  Component Value Date   CREATININE 0.93 04/29/2021   BUN 8 04/29/2021   NA 137 04/29/2021   K 4.0 04/29/2021   CL 102 04/29/2021   CO2 24 04/29/2021    Lab Results  Component Value Date   ALT 14 04/29/2021   AST 13 04/29/2021   ALKPHOS 85 04/18/2021   BILITOT 0.5 04/29/2021    Lab Results  Component Value Date   CHOL 319 (H) 04/18/2021   HDL 49 04/18/2021   LDLCALC 251 (H) 04/18/2021   TRIG 96 04/18/2021   CHOLHDL 6.5 04/18/2021   Lab Results  Component Value Date   LABRPR REACTIVE (A) 04/29/2021   RPRTITER 1:8 (H) 04/29/2021   HIV 1 RNA Quant  Date Value  04/29/2021 Not Detected Copies/mL  05/06/2020 <20 Copies/mL   05/04/2019 <20 NOT DETECTED copies/mL   CD4 T Cell Abs (/uL)  Date Value  04/29/2021 1,040  05/06/2020 858  05/04/2019 1,340  Problem List Items Addressed This Visit       High   HIV disease ([place])    His infection has been under excellent control on his current salvage regimen.  I was planning on simplifying his regimen to [facility] today but he says that he wants to consider injectable antiretrovirals.  I had him meet with Dr. Claris Gower to discuss this option.  He will get updated lab work today.  He received an influenza vaccine and an updated COVID vaccine here today.      Relevant Medications   imiquimod (ALDARA) 5 % cream (Start on 04/29/2022)   Other Relevant Orders   T-helper cells (CD4) count (not at [facility])   HIV-1 RNA quant-no reflex-bld   CBC   Comprehensive metabolic panel   RPR     Unprioritized   MDD (major depressive disorder), recurrent severe, without psychosis ([place])    I encouraged him to continue taking his medication, attending [facility] sessions and encouraged him to try to start regular exercise for his chronic depression and anxiety.      Relevant Medications   escitalopram (LEXAPRO) 20 MG tablet   Genital warts    I will have him use topical imiquimod cream every Monday Wednesday Friday.      Relevant Medications   imiquimod (ALDARA) 5 % cream (Start on 04/29/2022)   Syphilis    His RPR has been serofast at 1:8 after retreatment of his syphilis in 2021.  I will update his RPR today.      Relevant Medications   imiquimod (ALDARA) 5 % cream (Start on 04/29/2022)   Other Visit Diagnoses     Need for immunization against influenza    -  Primary   Relevant Orders   Flu Vaccine QUAD 52mo+IM (Fluarix, Fluzone & Alfiuria Quad PF) (Completed)         Michel Bickers, MD [place] [facility] [place] [phone] pager   [phone] cell 04/28/2022, 2:29 PM

## 2022-04-28 NOTE — Assessment & Plan Note (Signed)
I encouraged him to continue taking his medication, attending [facility] sessions and encouraged him to try to start regular exercise for his chronic depression and anxiety.

## 2022-04-28 NOTE — Assessment & Plan Note (Signed)
I will have him use topical imiquimod cream every Monday Wednesday Friday.

## 2022-04-28 NOTE — Assessment & Plan Note (Signed)
His RPR has been serofast at 1:8 after retreatment of his syphilis in 2021.  I will update his RPR today.

## 2022-04-29 LAB — T-HELPER CELLS (CD4) COUNT (NOT AT ARMC)
CD4 % Helper T Cell: 35 % (ref 33–65)
CD4 T Cell Abs: 1153 /uL (ref 400–1790)

## 2022-04-30 ENCOUNTER — Encounter: Payer: Self-pay | Admitting: Internal Medicine

## 2022-05-01 LAB — CBC
HCT: 44.5 % (ref 38.5–50.0)
Hemoglobin: 15.1 g/dL (ref 13.2–17.1)
MCH: 33 pg (ref 27.0–33.0)
MCHC: 33.9 g/dL (ref 32.0–36.0)
MCV: 97.4 fL (ref 80.0–100.0)
MPV: 10.4 fL (ref 7.5–12.5)
Platelets: 335 10*3/uL (ref 140–400)
RBC: 4.57 10*6/uL (ref 4.20–5.80)
RDW: 13.6 % (ref 11.0–15.0)
WBC: 11.8 10*3/uL — ABNORMAL HIGH (ref 3.8–10.8)

## 2022-05-01 LAB — COMPREHENSIVE METABOLIC PANEL
AG Ratio: 1.3 (calc) (ref 1.0–2.5)
ALT: 10 U/L (ref 9–46)
AST: 14 U/L (ref 10–40)
Albumin: 4.1 g/dL (ref 3.6–5.1)
Alkaline phosphatase (APISO): 87 U/L (ref 36–130)
BUN: 8 mg/dL (ref 7–25)
CO2: 26 mmol/L (ref 20–32)
Calcium: 9.7 mg/dL (ref 8.6–10.3)
Chloride: 105 mmol/L (ref 98–110)
Creat: 1.04 mg/dL (ref 0.60–1.26)
Globulin: 3.1 g/dL (calc) (ref 1.9–3.7)
Glucose, Bld: 87 mg/dL (ref 65–99)
Potassium: 4.1 mmol/L (ref 3.5–5.3)
Sodium: 138 mmol/L (ref 135–146)
Total Bilirubin: 0.4 mg/dL (ref 0.2–1.2)
Total Protein: 7.2 g/dL (ref 6.1–8.1)

## 2022-05-01 LAB — RPR TITER: RPR Titer: 1:8 {titer} — ABNORMAL HIGH

## 2022-05-01 LAB — RPR: RPR Ser Ql: REACTIVE — AB

## 2022-05-01 LAB — HIV-1 RNA QUANT-NO REFLEX-BLD
HIV 1 RNA Quant: NOT DETECTED Copies/mL
HIV-1 RNA Quant, Log: NOT DETECTED Log cps/mL

## 2022-05-01 LAB — FLUORESCENT TREPONEMAL AB(FTA)-IGG-BLD: Fluorescent Treponemal ABS: REACTIVE — AB

## 2022-05-10 ENCOUNTER — Other Ambulatory Visit: Payer: Self-pay | Admitting: Internal Medicine

## 2022-05-10 DIAGNOSIS — B20 Human immunodeficiency virus [HIV] disease: Secondary | ICD-10-CM

## 2022-05-18 ENCOUNTER — Encounter: Payer: Self-pay | Admitting: Internal Medicine

## 2022-05-18 NOTE — Telephone Encounter (Signed)
Spoke with Jeffery [name] and it takes 4-5 weeks for results to come back. That places it about end of November/early December. Once we review those results and if he is still a good candidate for injections, we will reach out him then. Thanks!

## 2022-05-18 NOTE — Telephone Encounter (Signed)
We are waiting on his genosure archive to come back to make sure it is an option for him first.

## 2022-05-26 ENCOUNTER — Other Ambulatory Visit (HOSPITAL_COMMUNITY): Payer: Self-pay

## 2022-05-26 ENCOUNTER — Telehealth: Payer: Self-pay

## 2022-05-26 NOTE — Telephone Encounter (Signed)
[facility] Patient Advocate Encounter   Received notification from [facility] that prior authorization for Jeffery [name] is required.   PA submitted on 05/26/2022 Key [id number] Status is pending    [facility] will continue to follow.   [name], CPhT Specialty Pharmacy Patient [facility] Phone: [phone] Fax:  [phone]

## 2022-05-26 NOTE — Telephone Encounter (Signed)
[facility] Patient Advocate Encounter  Prior Authorization for cabenuva has been approved.    PA# [id number] Effective dates: 05/26/22 through until further notice  Patients co-pay is $0.00.   Prescription can be filled at [facility].  [facility] will continue to follow.  [name], CPhT Specialty Pharmacy Patient [facility] Phone: [phone] Fax:  [phone]

## 2022-05-26 NOTE — Telephone Encounter (Signed)
[place] - I got his genotype back. It was all clear. Ok to proceed with Cabenuva?

## 2022-06-01 ENCOUNTER — Other Ambulatory Visit (HOSPITAL_COMMUNITY): Payer: Self-pay

## 2022-06-01 ENCOUNTER — Other Ambulatory Visit: Payer: Self-pay | Admitting: Pharmacist

## 2022-06-01 DIAGNOSIS — B20 Human immunodeficiency virus [HIV] disease: Secondary | ICD-10-CM

## 2022-06-01 MED ORDER — CABOTEGRAVIR & RILPIVIRINE ER 600 & 900 MG/3ML IM SUER
1.0000 | INTRAMUSCULAR | 1 refills | Status: DC
  Filled 2022-06-01 (×3): qty 6, 30d supply, fill #0
  Filled 2022-06-23 – 2022-06-24 (×2): qty 6, 30d supply, fill #1

## 2022-06-01 MED ORDER — CABOTEGRAVIR & RILPIVIRINE ER 600 & 900 MG/3ML IM SUER
1.0000 | INTRAMUSCULAR | 5 refills | Status: DC
  Filled 2022-06-01 – 2022-08-14 (×2): qty 6, 60d supply, fill #0
  Filled 2022-10-16: qty 6, 60d supply, fill #1
  Filled 2022-12-17: qty 6, 60d supply, fill #2
  Filled 2023-02-09: qty 6, 60d supply, fill #3
  Filled 2023-04-08: qty 6, 60d supply, fill #4
  Filled ????-??-??: fill #4

## 2022-06-02 ENCOUNTER — Other Ambulatory Visit (HOSPITAL_COMMUNITY): Payer: Self-pay

## 2022-06-02 ENCOUNTER — Telehealth: Payer: Self-pay

## 2022-06-02 NOTE — Telephone Encounter (Signed)
[facility] Patient Advocate Encounter  Patient's medication (Cabenuva) have been couriered to [facility] from [facility] and will be administered on the patient next office visit on 06/03/22.  Marchele Decock , CPhT Specialty Pharmacy Patient [facility] Phone: [phone] Fax:  [phone]  

## 2022-06-02 NOTE — Progress Notes (Signed)
HPI: Jeffery [name] is a 31 y.o. male who presents to the [facility] for [place] administration.  Patient Active Problem List   Diagnosis Date Noted   Schizoaffective disorder, depressive type ([place]) 04/18/2021   Genital warts 05/21/2020   Syphilis 05/21/2020   Cervical strain 09/16/2018   Assessment of effects of psychotropic drug in patient at risk for metabolic syndrome 62/13/0865   Hypertension 10/05/2016   MDD (major depressive disorder), recurrent severe, without psychosis ([place]) 02/25/2016   Cannabis use disorder, severe, dependence ([place]) 02/25/2016   Bipolar disorder ([place]) 06/21/2014   Anxiety 06/21/2014   Cigarette smoker 06/21/2014   HIV disease ([place]) 06/20/2014   Dyslipidemia 06/20/2014    Patient's Medications  New Prescriptions   No medications on file  Previous Medications   AMLODIPINE (NORVASC) 5 MG TABLET    Take 1 tablet (5 mg total) by mouth daily.   ATORVASTATIN (LIPITOR) 40 MG TABLET    Take 1 tablet (40 mg total) by mouth daily.   BENZONATATE (TESSALON) 100 MG CAPSULE    Take 1 capsule (100 mg total) by mouth every 8 (eight) hours as needed for cough.   CABOTEGRAVIR & RILPIVIRINE ER (CABENUVA) 600 & 900 MG/3ML INJECTION    Inject 1 kit into the muscle every 30 (thirty) days.   CABOTEGRAVIR & RILPIVIRINE ER (CABENUVA) 600 & 900 MG/3ML INJECTION    Inject 1 kit into the muscle every 2 (two) months.   DARUNAVIR-COBICISTAT (PREZCOBIX) 800-150 MG TABLET    TAKE 1 TABLET BY MOUTH EVERY DAY WITH FOOD. SWALLOW WHOLE. DO NOT CRUSH, BREAK, AND CHEW TABLETS   ESCITALOPRAM (LEXAPRO) 20 MG TABLET    Take 20 mg by mouth daily.   FLUOXETINE (PROZAC) 20 MG CAPSULE    Take 1 capsule (20 mg total) by mouth daily.   HYDROXYZINE (ATARAX/VISTARIL) 25 MG TABLET    Take 1 tablet (25 mg total) by mouth every 6 (six) hours as needed (anxiety/agitation or CIWA < or = 10).   IMIQUIMOD (ALDARA) 5 % CREAM    Apply topically 3 (three) times a week.   MULTIPLE VITAMINS-MINERALS  (B COMPLEX-C-E-ZINC) TABLET    Take 1 tablet by mouth daily.   OLANZAPINE (ZYPREXA) 10 MG TABLET    Take 10 mg by mouth at bedtime.   TRAZODONE (DESYREL) 50 MG TABLET    Take 1 tablet (50 mg total) by mouth at bedtime as needed for sleep.   TRIUMEQ 600-50-300 MG TABLET    TAKE 1 TABLET BY MOUTH DAILY  Modified Medications   No medications on file  Discontinued Medications   No medications on file    Allergies: Allergies  Allergen Reactions   Bee Venom Swelling and Other (See Comments)    Large local reactions   Lisinopril Swelling and Other (See Comments)    Facial and lip swelling    Past Medical History: Past Medical History:  Diagnosis Date   Anxiety    Chlamydia    Depression    Drug induced constipation    Hemorrhoids    HIV infection ([place])    Hyperlipidemia    Hypertension    Pt went off meds on his own; has been monitored without any problems   Male-to-male transgender person    former history    Nicotine dependence    Syphilis 2013 and 2015    Social History: Social History   Socioeconomic History   Marital status: Single    Spouse name: Not on file   Number  of children: Not on file   Years of education: Not on file   Highest education level: Not on file  Occupational History   Occupation: restraunt    Employer: [employer]  Tobacco Use   Smoking status: Light Smoker    Packs/day: 0.25    Types: Cigarettes   Smokeless tobacco: Never   Tobacco comments:    5 cigarettes a day  Vaping Use   Vaping Use: Never used  Substance and Sexual Activity   Alcohol use: Yes    Alcohol/week: 0.0 standard drinks of alcohol    Comment: occasional   Drug use: Yes    Frequency: 3.0 times per week    Types: Marijuana   Sexual activity: Not Currently    Partners: Male    Comment: given condoms  Other Topics Concern   Not on file  Social History Narrative   Not on file   Social Determinants of Health   Financial Resource Strain: Low Risk  (09/16/2018)    Overall Financial Resource Strain (CARDIA)    Difficulty of Paying Living Expenses: Not hard at all  Food Insecurity: No Food Insecurity (09/16/2018)   Hunger Vital Sign    Worried About Running Out of Food in the Last Year: Never true    [place] in the Last Year: Never true  Transportation Needs: No Transportation Needs (09/16/2018)   PRAPARE - Hydrologist (Medical): No    Lack of Transportation (Non-Medical): No  Physical Activity: Insufficiently Active (09/16/2018)   Exercise Vital Sign    Days of Exercise per Week: 1 day    Minutes of Exercise per Session: 30 min  Stress: Not on file  Social Connections: Unknown (09/16/2018)   Social Connection and Isolation Panel [NHANES]    Frequency of Communication with Friends and Family: Once a week    Frequency of Social Gatherings with Friends and Family: Once a week    Attends Religious Services: Never    Marine scientist or Organizations: Patient refused    Attends Archivist Meetings: Patient refused    Marital Status: Patient refused    Labs: Lab Results  Component Value Date   HIV1RNAQUANT Not Detected 04/28/2022   HIV1RNAQUANT Not Detected 04/29/2021   HIV1RNAQUANT <20 05/06/2020   CD4TABS 1,153 04/28/2022   CD4TABS 1,040 04/29/2021   CD4TABS 858 05/06/2020    RPR and STI Lab Results  Component Value Date   LABRPR REACTIVE (A) 04/28/2022   LABRPR REACTIVE (A) 04/29/2021   LABRPR Reactive (A) 04/18/2021   LABRPR REACTIVE (A) 05/06/2020   LABRPR REACTIVE (A) 05/04/2019   RPRTITER 1:8 (H) 04/28/2022   RPRTITER 1:8 (H) 04/29/2021   RPRTITER 1:16 (H) 05/06/2020   RPRTITER 1:4 (H) 05/04/2019   RPRTITER 1:4 (H) 04/19/2018    STI Results GC CT  10/03/2019 12:00 AM Negative  Negative   12/01/2017 12:00 AM Negative  C **POSITIVE**  C  08/24/2014 12:00 AM NG: Negative  CT: Negative   06/07/2014 12:00 AM NG: Negative  CT: Negative     C Corrected result    Hepatitis  B Lab Results  Component Value Date   HEPBSAB POS (A) 06/07/2014   HEPBSAG NON REACTIVE 04/18/2021   HEPBCAB NON REACTIVE 06/07/2014   Hepatitis C No results found for: "HEPCAB", "HCVRNAPCRQN" Hepatitis A Lab Results  Component Value Date   HAV NON REACTIVE 06/07/2014   Lipids: Lab Results  Component Value Date   CHOL  319 (H) 04/18/2021   TRIG 96 04/18/2021   HDL 49 04/18/2021   CHOLHDL 6.5 04/18/2021   VLDL 19 04/18/2021   LDLCALC 251 (H) 04/18/2021    Current HIV Regimen: Triumeq PO daily  TARGET DATE: The 29th of the month  Assessment: Alfonso Carden presents today for Lannie's first initiation injection for Cabenuva. Counseled that [place] is two separate intramuscular injections in the gluteal muscle on each side for each visit. Explained that the second injection is 30 days after the initial injection then every 2 months thereafter. Discussed the rare but significant chance of developing resistance despite compliance. Explained that showing up to injection appointments is very important and warned that if 2 appointments are missed, it will be reassessed by their provider whether they are a good candidate for injection therapy. Counseled on possible side effects associated with the injections such as injection site pain, which is usually mild to moderate in nature, injection site nodules, and injection site reactions. Asked to call the clinic or send me a mychart message if they experience any issues, such as fatigue, nausea, headache, rash, or dizziness. Advised that they can take ibuprofen or tylenol for injection site pain if needed. Karol was offered STI screening and condoms which Legrand [place] ____.  Yazeed is eligible for the Hep A, Prevnar20 and meningococcal vaccines. After discussing the risks and benefits, Kolbe ___. Administered the ___.  Administered cabotegravir 655m/3mL in left upper outer quadrant of the gluteal muscle. Administered rilpivirine 900 mg/328m in the right upper outer quadrant of the gluteal muscle. Monitored patient for 10 minutes after injection. Injections were tolerated well without issue. Counseled to stop taking Triumeq after today's dose and to call with any issues that may arise. Will make follow up appointments for second initiation injection in 30 days and then maintenance injections every 2 months thereafter.   Plan: - Stop Triumeq after today's dose - Administered _____ today - First Cabenuva injections administered - Second initiation injection scheduled for *** with [name]/ Dr. CaMegan Salon Maintenance injections scheduled for *** with [name]/ Dr. CaMegan Salon Call with any issues or questions  [facility]

## 2022-06-03 ENCOUNTER — Other Ambulatory Visit (HOSPITAL_COMMUNITY)
Admission: RE | Admit: 2022-06-03 | Discharge: 2022-06-03 | Disposition: A | Discharge status: Home / Self Care | Payer: Medicare Other | Source: Ambulatory Visit | Attending: Internal Medicine | Admitting: Internal Medicine

## 2022-06-03 ENCOUNTER — Ambulatory Visit (INDEPENDENT_AMBULATORY_CARE_PROVIDER_SITE_OTHER): Payer: Medicare Other | Admitting: Pharmacist

## 2022-06-03 ENCOUNTER — Other Ambulatory Visit: Payer: Self-pay

## 2022-06-03 DIAGNOSIS — Z113 Encounter for screening for infections with a predominantly sexual mode of transmission: Secondary | ICD-10-CM | POA: Insufficient documentation

## 2022-06-03 DIAGNOSIS — B20 Human immunodeficiency virus [HIV] disease: Secondary | ICD-10-CM

## 2022-06-03 MED ORDER — CABOTEGRAVIR & RILPIVIRINE ER 600 & 900 MG/3ML IM SUER
1.0000 | Freq: Once | INTRAMUSCULAR | Status: AC
  Administered 2022-06-03: 1 via INTRAMUSCULAR

## 2022-06-04 ENCOUNTER — Other Ambulatory Visit (HOSPITAL_COMMUNITY): Payer: Self-pay

## 2022-06-04 LAB — URINE CYTOLOGY ANCILLARY ONLY
Chlamydia: NEGATIVE
Comment: NEGATIVE
Comment: NORMAL
Neisseria Gonorrhea: NEGATIVE

## 2022-06-04 LAB — CYTOLOGY, (ORAL, ANAL, URETHRAL) ANCILLARY ONLY
Chlamydia: NEGATIVE
Chlamydia: NEGATIVE
Comment: NEGATIVE
Comment: NEGATIVE
Comment: NORMAL
Comment: NORMAL
Neisseria Gonorrhea: NEGATIVE
Neisseria Gonorrhea: NEGATIVE

## 2022-06-17 ENCOUNTER — Other Ambulatory Visit (HOSPITAL_COMMUNITY): Payer: Self-pay

## 2022-06-22 ENCOUNTER — Other Ambulatory Visit (HOSPITAL_COMMUNITY): Payer: Self-pay

## 2022-06-23 ENCOUNTER — Other Ambulatory Visit (HOSPITAL_COMMUNITY): Payer: Self-pay

## 2022-06-24 ENCOUNTER — Encounter: Payer: Self-pay | Admitting: Pharmacist

## 2022-06-24 ENCOUNTER — Other Ambulatory Visit: Payer: Self-pay

## 2022-06-24 ENCOUNTER — Other Ambulatory Visit (HOSPITAL_COMMUNITY): Payer: Self-pay

## 2022-06-25 ENCOUNTER — Telehealth: Payer: Self-pay

## 2022-06-25 NOTE — Telephone Encounter (Signed)
[facility] Patient Advocate Encounter  Patient's medications (cabenuva) have been couriered to [facility] from [facility]: [phone] , and will be administered on  07/02/2022.

## 2022-06-26 ENCOUNTER — Ambulatory Visit (HOSPITAL_COMMUNITY)
Admission: EM | Admit: 2022-06-26 | Discharge: 2022-06-26 | Disposition: A | Discharge status: Home / Self Care | Payer: Medicare Other | Attending: Family Medicine | Admitting: Family Medicine

## 2022-06-26 ENCOUNTER — Ambulatory Visit (INDEPENDENT_AMBULATORY_CARE_PROVIDER_SITE_OTHER): Payer: Medicare Other

## 2022-06-26 ENCOUNTER — Encounter (HOSPITAL_COMMUNITY): Payer: Self-pay | Admitting: Emergency Medicine

## 2022-06-26 DIAGNOSIS — J069 Acute upper respiratory infection, unspecified: Secondary | ICD-10-CM | POA: Diagnosis not present

## 2022-06-26 DIAGNOSIS — R0781 Pleurodynia: Secondary | ICD-10-CM

## 2022-06-26 DIAGNOSIS — R059 Cough, unspecified: Secondary | ICD-10-CM

## 2022-06-26 MED ORDER — IBUPROFEN 800 MG PO TABS: 800.0000 mg | ORAL_TABLET | Freq: Three times a day (TID) | ORAL | 0 refills | Status: DC | PRN

## 2022-06-26 MED ORDER — BENZONATATE 100 MG PO CAPS: 100.0000 mg | ORAL_CAPSULE | Freq: Three times a day (TID) | ORAL | 0 refills | Status: DC | PRN

## 2022-06-26 NOTE — ED Triage Notes (Signed)
Since Tuesday has cough that is productive, congestion, body aches, sweats, and adb cramps. Started to feel better. Home covid was negative.  Cough, congestion and abd cramps area still persisting. Took Nyquil

## 2022-06-26 NOTE — ED Provider Notes (Signed)
[facility]    CSN: [id number] Arrival date & time: 06/26/22  1228      History   Chief Complaint Chief Complaint  Patient presents with   Cough    HPI Jeffery [name] is a 31 y.o. male.    Cough  Here for cough and congestion and fever.  Symptoms began on December 19, and the fever resolved by yesterday morning.  No nausea or vomiting, but he has had some abdominal cramps.  Had a little diarrhea a couple of times.  His anterior chest has been hurting when he coughs.  He does admit that he feels some better, but comes in because the cough and congestion is continuing.  No history of asthma.  He did a home COVID test that was negative.  Past Medical History:  Diagnosis Date   Anxiety    Chlamydia    Depression    Drug induced constipation    Hemorrhoids    HIV infection ([place])    Hyperlipidemia    Hypertension    Pt went off meds on his own; has been monitored without any problems   Male-to-male transgender person    former history    Nicotine dependence    Syphilis 2013 and 2015    Patient Active Problem List   Diagnosis Date Noted   Schizoaffective disorder, depressive type ([place]) 04/18/2021   Genital warts 05/21/2020   Syphilis 05/21/2020   Cervical strain 09/16/2018   Assessment of effects of psychotropic drug in patient at risk for metabolic syndrome 62/09/5595   Hypertension 10/05/2016   MDD (major depressive disorder), recurrent severe, without psychosis ([place]) 02/25/2016   Cannabis use disorder, severe, dependence ([name]) 02/25/2016   Bipolar disorder ([place]) 06/21/2014   Anxiety 06/21/2014   Cigarette smoker 06/21/2014   HIV disease ([place]) 06/20/2014   Dyslipidemia 06/20/2014    History reviewed. No pertinent surgical history.     Home Medications    Prior to Admission medications   Medication Sig Start Date End Date Taking? Authorizing Provider  benzonatate (TESSALON) 100 MG capsule Take 1 capsule (100 mg total) by mouth 3 (three)  times daily as needed for cough. 06/26/22  Yes Barrett Henle, MD  ibuprofen (ADVIL) 800 MG tablet Take 1 tablet (800 mg total) by mouth every 8 (eight) hours as needed (pain). 06/26/22  Yes Daviel Allegretto, Gwenlyn Perking, MD  cabotegravir & rilpivirine ER (CABENUVA) 600 & 900 MG/3ML injection Inject 1 kit into the muscle every 30 (thirty) days. 06/01/22   Kuppelweiser, [name] L, RPH-CPP  cabotegravir & rilpivirine ER (CABENUVA) 600 & 900 MG/3ML injection Inject 1 kit into the muscle every 2 (two) months. 06/01/22   Kuppelweiser, [name] L, RPH-CPP  darunavir-cobicistat (PREZCOBIX) 800-150 MG tablet TAKE 1 TABLET BY MOUTH EVERY DAY WITH FOOD. SWALLOW WHOLE. DO NOT CRUSH, BREAK, AND CHEW TABLETS 05/11/22   Michel Bickers, MD  escitalopram (LEXAPRO) 20 MG tablet Take 20 mg by mouth daily. 04/17/22   [provider]  imiquimod (ALDARA) 5 % cream Apply topically 3 (three) times a week. 04/29/22   Michel Bickers, MD  OLANZapine (ZYPREXA) 10 MG tablet Take 10 mg by mouth at bedtime. 11/10/21   [provider]  TRIUMEQ 416-38-453 MG tablet TAKE 1 TABLET BY MOUTH DAILY 05/11/22   Michel Bickers, MD    Family History Family History  Problem Relation Age of Onset   Hypertension Father    Hypertension Maternal Grandmother     Social History Social History   Tobacco Use  Smoking status: Light Smoker    Packs/day: 0.25    Types: Cigarettes   Smokeless tobacco: Never   Tobacco comments:    5 cigarettes a day  Vaping Use   Vaping Use: Never used  Substance Use Topics   Alcohol use: Yes    Alcohol/week: 0.0 standard drinks of alcohol    Comment: occasional   Drug use: Yes    Frequency: 3.0 times per week    Types: Marijuana     Allergies   Bee venom and Lisinopril   Review of Systems Review of Systems  Respiratory:  Positive for cough.      Physical Exam Triage Vital Signs ED Triage Vitals  Enc Vitals Group     BP 06/26/22 1317 (!) 145/98     Pulse Rate 06/26/22 1317 77      Resp 06/26/22 1317 16     Temp 06/26/22 1317 98.1 F (36.7 C)     Temp Source 06/26/22 1317 Oral     SpO2 06/26/22 1317 98 %     Weight --      Height --      Head Circumference --      Peak Flow --      Pain Score 06/26/22 1316 6     Pain Loc --      Pain Edu? --      Excl. in [place]? --    No data found.  Updated Vital Signs BP (!) 145/98 (BP Location: Left Arm)   Pulse 77   Temp 98.1 F (36.7 C) (Oral)   Resp 16   SpO2 98%   Visual Acuity Right Eye Distance:   Left Eye Distance:   Bilateral Distance:    Right Eye Near:   Left Eye Near:    Bilateral Near:     Physical Exam Vitals reviewed.  Constitutional:      General: He is not in acute distress.    Appearance: He is not ill-appearing, toxic-appearing or diaphoretic.  HENT:     Right Ear: Tympanic membrane and ear canal normal.     Left Ear: Tympanic membrane and ear canal normal.     Nose: Congestion present.     Mouth/Throat:     Mouth: Mucous membranes are moist.     Comments: No erythema.  Clear mucus is draining Eyes:     Extraocular Movements: Extraocular movements intact.     Conjunctiva/sclera: Conjunctivae normal.     Pupils: Pupils are equal, round, and reactive to light.  Cardiovascular:     Rate and Rhythm: Normal rate and regular rhythm.     Heart sounds: No murmur heard. Pulmonary:     Effort: Pulmonary effort is normal. No respiratory distress.     Breath sounds: No stridor. No wheezing, rhonchi or rales.     Comments: He is mildly tender at the left sternal border, but it does not reproduce the pain he is experiencing when he coughs. Musculoskeletal:     Cervical back: Neck supple.  Lymphadenopathy:     Cervical: No cervical adenopathy.  Skin:    Coloration: Skin is not jaundiced or pale.  Neurological:     General: No focal deficit present.     Mental Status: He is alert and oriented to person, place, and time.  Psychiatric:        Behavior: Behavior normal.      UC  Treatments / Results  Labs (all labs ordered are listed, but only abnormal results are  displayed) Labs Reviewed - No data to display  EKG   Radiology DG Chest 2 View  Result Date: 06/26/2022 CLINICAL DATA:  Productive cough, congestion, body aches, sweats, abdominal cramps, COVID-19 negative EXAM: CHEST - 2 VIEW COMPARISON:  None Available. FINDINGS: Normal heart size, mediastinal contours, and pulmonary vascularity. Mild peribronchial thickening. No pulmonary infiltrate, pleural effusion, or pneumothorax. Bones unremarkable. IMPRESSION: Mild bronchitic changes without acute infiltrate. Electronically Signed   By: Lavonia Dana M.D.   On: 06/26/2022 14:07    Procedures Procedures (including critical care time)  Medications Ordered in UC Medications - No data to display  Initial Impression / Assessment and Plan / UC Course  I have reviewed the triage vital signs and the nursing notes.  Pertinent labs & imaging results that were available during my care of the patient were reviewed by me and considered in my medical decision making (see chart for details).        Chest x-ray does not show any infiltrate or consolidation.  Tessalon Perles are sent for the cough and ibuprofen is sent in for the chest pain. Final Clinical Impressions(s) / UC Diagnoses   Final diagnoses:  Viral URI with cough  Pleuritic chest pain     Discharge Instructions      Chest x-ray does not show any pneumonia or fluid  Take benzonatate 100 mg, 1 tab every 8 hours as needed for cough.  Take ibuprofen 800 mg--1 tab every 8 hours as needed for pain.       ED Prescriptions     Medication Sig Dispense Auth. Provider   benzonatate (TESSALON) 100 MG capsule Take 1 capsule (100 mg total) by mouth 3 (three) times daily as needed for cough. 21 capsule Barrett Henle, MD   ibuprofen (ADVIL) 800 MG tablet Take 1 tablet (800 mg total) by mouth every 8 (eight) hours as needed (pain). 21 tablet  Arkie Tagliaferro, Gwenlyn Perking, MD      PDMP not reviewed this encounter.   Barrett Henle, MD 06/26/22 [phone]

## 2022-06-26 NOTE — Discharge Instructions (Signed)
Chest x-ray does not show any pneumonia or fluid  Take benzonatate 100 mg, 1 tab every 8 hours as needed for cough.  Take ibuprofen 800 mg--1 tab every 8 hours as needed for pain.

## 2022-07-02 ENCOUNTER — Ambulatory Visit (INDEPENDENT_AMBULATORY_CARE_PROVIDER_SITE_OTHER): Payer: Medicare Other | Admitting: Pharmacist

## 2022-07-02 ENCOUNTER — Other Ambulatory Visit: Payer: Self-pay

## 2022-07-02 DIAGNOSIS — B20 Human immunodeficiency virus [HIV] disease: Secondary | ICD-10-CM

## 2022-07-02 MED ORDER — CABOTEGRAVIR & RILPIVIRINE ER 600 & 900 MG/3ML IM SUER
1.0000 | Freq: Once | INTRAMUSCULAR | Status: AC
  Administered 2022-07-02: 1 via INTRAMUSCULAR

## 2022-07-02 NOTE — Progress Notes (Signed)
HPI: Jeffery [name] is a 31 y.o. male who presents to the [facility] clinic for [place] administration.  Patient Active Problem List   Diagnosis Date Noted   Schizoaffective disorder, depressive type ([place]) 04/18/2021   Genital warts 05/21/2020   Syphilis 05/21/2020   Cervical strain 09/16/2018   Assessment of effects of psychotropic drug in patient at risk for metabolic syndrome 84/53/6468   Hypertension 10/05/2016   MDD (major depressive disorder), recurrent severe, without psychosis ([place]) 02/25/2016   Cannabis use disorder, severe, dependence ([place]) 02/25/2016   Bipolar disorder ([place]) 06/21/2014   Anxiety 06/21/2014   Cigarette smoker 06/21/2014   HIV disease ([place]) 06/20/2014   Dyslipidemia 06/20/2014    Patient's Medications  New Prescriptions   No medications on file  Previous Medications   BENZONATATE (TESSALON) 100 MG CAPSULE    Take 1 capsule (100 mg total) by mouth 3 (three) times daily as needed for cough.   CABOTEGRAVIR & RILPIVIRINE ER (CABENUVA) 600 & 900 MG/3ML INJECTION    Inject 1 kit into the muscle every 30 (thirty) days.   CABOTEGRAVIR & RILPIVIRINE ER (CABENUVA) 600 & 900 MG/3ML INJECTION    Inject 1 kit into the muscle every 2 (two) months.   DARUNAVIR-COBICISTAT (PREZCOBIX) 800-150 MG TABLET    TAKE 1 TABLET BY MOUTH EVERY DAY WITH FOOD. SWALLOW WHOLE. DO NOT CRUSH, BREAK, AND CHEW TABLETS   ESCITALOPRAM (LEXAPRO) 20 MG TABLET    Take 20 mg by mouth daily.   IBUPROFEN (ADVIL) 800 MG TABLET    Take 1 tablet (800 mg total) by mouth every 8 (eight) hours as needed (pain).   IMIQUIMOD (ALDARA) 5 % CREAM    Apply topically 3 (three) times a week.   OLANZAPINE (ZYPREXA) 10 MG TABLET    Take 10 mg by mouth at bedtime.   TRIUMEQ 600-50-300 MG TABLET    TAKE 1 TABLET BY MOUTH DAILY  Modified Medications   No medications on file  Discontinued Medications   No medications on file    Allergies: Allergies  Allergen Reactions   Bee Venom Swelling and Other  (See Comments)    Large local reactions   Lisinopril Swelling and Other (See Comments)    Facial and lip swelling    Past Medical History: Past Medical History:  Diagnosis Date   Anxiety    Chlamydia    Depression    Drug induced constipation    Hemorrhoids    HIV infection ([place])    Hyperlipidemia    Hypertension    Pt went off meds on his own; has been monitored without any problems   Male-to-male transgender person    former history    Nicotine dependence    Syphilis 2013 and 2015    Social History: Social History   Socioeconomic History   Marital status: Single    Spouse name: Not on file   Number of children: Not on file   Years of education: Not on file   Highest education level: Not on file  Occupational History   Occupation: restraunt    Employer: [employer]  Tobacco Use   Smoking status: Light Smoker    Packs/day: 0.25    Types: Cigarettes   Smokeless tobacco: Never   Tobacco comments:    5 cigarettes a day  Vaping Use   Vaping Use: Never used  Substance and Sexual Activity   Alcohol use: Yes    Alcohol/week: 0.0 standard drinks of alcohol    Comment: occasional  Drug use: Yes    Frequency: 3.0 times per week    Types: Marijuana   Sexual activity: Not Currently    Partners: Male    Comment: given condoms  Other Topics Concern   Not on file  Social History Narrative   Not on file   Social Determinants of Health   Financial Resource Strain: Low Risk  (09/16/2018)   Overall Financial Resource Strain (CARDIA)    Difficulty of Paying Living Expenses: Not hard at all  Food Insecurity: No Food Insecurity (09/16/2018)   Hunger Vital Sign    Worried About Running Out of Food in the Last Year: Never true    [place] in the Last Year: Never true  Transportation Needs: No Transportation Needs (09/16/2018)   PRAPARE - Hydrologist (Medical): No    Lack of Transportation (Non-Medical): No  Physical Activity:  Insufficiently Active (09/16/2018)   Exercise Vital Sign    Days of Exercise per Week: 1 day    Minutes of Exercise per Session: 30 min  Stress: Not on file  Social Connections: Unknown (09/16/2018)   Social Connection and Isolation Panel [NHANES]    Frequency of Communication with Friends and Family: Once a week    Frequency of Social Gatherings with Friends and Family: Once a week    Attends Religious Services: Never    Marine scientist or Organizations: Patient refused    Attends Archivist Meetings: Patient refused    Marital Status: Patient refused    Labs: Lab Results  Component Value Date   HIV1RNAQUANT Not Detected 04/28/2022   HIV1RNAQUANT Not Detected 04/29/2021   HIV1RNAQUANT <20 05/06/2020   CD4TABS 1,153 04/28/2022   CD4TABS 1,040 04/29/2021   CD4TABS 858 05/06/2020    RPR and STI Lab Results  Component Value Date   LABRPR REACTIVE (A) 04/28/2022   LABRPR REACTIVE (A) 04/29/2021   LABRPR Reactive (A) 04/18/2021   LABRPR REACTIVE (A) 05/06/2020   LABRPR REACTIVE (A) 05/04/2019   RPRTITER 1:8 (H) 04/28/2022   RPRTITER 1:8 (H) 04/29/2021   RPRTITER 1:16 (H) 05/06/2020   RPRTITER 1:4 (H) 05/04/2019   RPRTITER 1:4 (H) 04/19/2018    STI Results GC CT  06/03/2022 11:47 AM Negative    Negative    Negative  Negative    Negative    Negative   10/03/2019 12:00 AM Negative  Negative   12/01/2017 12:00 AM Negative  C **POSITIVE**  C  08/24/2014 12:00 AM NG: Negative  CT: Negative   06/07/2014 12:00 AM NG: Negative  CT: Negative     C Corrected result   Multiple values from one day are sorted in reverse-chronological order    Hepatitis B Lab Results  Component Value Date   HEPBSAB POS (A) 06/07/2014   HEPBSAG NON REACTIVE 04/18/2021   HEPBCAB NON REACTIVE 06/07/2014   Hepatitis C No results found for: "HEPCAB", "HCVRNAPCRQN" Hepatitis A Lab Results  Component Value Date   HAV NON REACTIVE 06/07/2014   Lipids: Lab Results  Component  Value Date   CHOL 319 (H) 04/18/2021   TRIG 96 04/18/2021   HDL 49 04/18/2021   CHOLHDL 6.5 04/18/2021   VLDL 19 04/18/2021   LDLCALC 251 (H) 04/18/2021    TARGET DATE: The 29th  Assessment: Jeffery [name] presents today for his second set of initiation injections of Cabenuva. First injections were tolerated well without issues except for mild pain x 1 week. No other issues or  concerns.  Administered cabotegravir 652m/3mL in left upper outer quadrant of the gluteal muscle. Administered rilpivirine 900 mg/374min the right upper outer quadrant of the gluteal muscle. No issues with injections. He will follow up in 2 months for next set of injections with Dr. CaMegan Salon  Plan: - Cabenuva injections administered - HIV RNA today - Next injections scheduled for 09/02/22 with Dr. CaMegan Salonnd 10/26/22 with me - Call with any issues or questions  Aleeya Veitch L. Colen Eltzroth, PharmD, BCIDP, AAHIVP, CPHemlocklinical Pharmacist Practitioner [facility] [facility]

## 2022-07-06 LAB — HIV-1 RNA QUANT-NO REFLEX-BLD
HIV 1 RNA Quant: NOT DETECTED Copies/mL
HIV-1 RNA Quant, Log: NOT DETECTED Log cps/mL

## 2022-08-12 ENCOUNTER — Other Ambulatory Visit (HOSPITAL_COMMUNITY): Payer: Self-pay

## 2022-08-14 ENCOUNTER — Other Ambulatory Visit (HOSPITAL_COMMUNITY): Payer: Self-pay

## 2022-08-14 ENCOUNTER — Other Ambulatory Visit: Payer: Self-pay

## 2022-08-17 ENCOUNTER — Other Ambulatory Visit (HOSPITAL_COMMUNITY): Payer: Self-pay

## 2022-08-17 ENCOUNTER — Other Ambulatory Visit: Payer: Self-pay

## 2022-08-18 ENCOUNTER — Telehealth: Payer: Self-pay

## 2022-08-18 NOTE — Telephone Encounter (Signed)
[facility] Patient Advocate Encounter  Patient's medication Jeffery [name]) have been couriered to [facility] from [facility] and will be administered on the patient next office visit on 09/02/22.  [name] , [facility] Patient [facility] Phone: [phone] Fax:  [phone]

## 2022-08-31 ENCOUNTER — Other Ambulatory Visit: Payer: Self-pay

## 2022-09-02 ENCOUNTER — Encounter: Payer: Self-pay | Admitting: Internal Medicine

## 2022-09-02 ENCOUNTER — Other Ambulatory Visit: Payer: Self-pay

## 2022-09-02 ENCOUNTER — Ambulatory Visit (INDEPENDENT_AMBULATORY_CARE_PROVIDER_SITE_OTHER): Payer: Medicare (Managed Care) | Admitting: Internal Medicine

## 2022-09-02 ENCOUNTER — Other Ambulatory Visit (HOSPITAL_COMMUNITY)
Admission: RE | Admit: 2022-09-02 | Discharge: 2022-09-02 | Disposition: A | Discharge status: Home / Self Care | Payer: Medicare (Managed Care) | Source: Ambulatory Visit | Attending: Internal Medicine | Admitting: Internal Medicine

## 2022-09-02 VITALS — BP 131/89 | HR 84 | Temp 97.8°F | Resp 16 | Ht 67.0 in | Wt 158.8 lb

## 2022-09-02 DIAGNOSIS — Z1212 Encounter for screening for malignant neoplasm of rectum: Secondary | ICD-10-CM | POA: Insufficient documentation

## 2022-09-02 DIAGNOSIS — F3132 Bipolar disorder, current episode depressed, moderate: Secondary | ICD-10-CM | POA: Diagnosis not present

## 2022-09-02 DIAGNOSIS — A630 Anogenital (venereal) warts: Secondary | ICD-10-CM | POA: Diagnosis not present

## 2022-09-02 DIAGNOSIS — B20 Human immunodeficiency virus [HIV] disease: Secondary | ICD-10-CM

## 2022-09-02 MED ORDER — CABOTEGRAVIR & RILPIVIRINE ER 600 & 900 MG/3ML IM SUER
1.0000 | Freq: Once | INTRAMUSCULAR | Status: AC
  Administered 2022-09-02: 1 via INTRAMUSCULAR

## 2022-09-02 NOTE — Assessment & Plan Note (Signed)
His infection has remained under excellent control since switching to Cabenuva injections.  He is due for injections today and repeat lab work.  He will follow-up in 2 months.

## 2022-09-02 NOTE — Assessment & Plan Note (Signed)
I will obtain an anal Pap today.

## 2022-09-02 NOTE — Addendum Note (Signed)
Addended by: Tomi Bamberger on: 09/02/2022 11:40 AM   Modules accepted: Orders

## 2022-09-02 NOTE — Progress Notes (Signed)
Patient Active Problem List   Diagnosis Date Noted   HIV disease ([place]) 06/20/2014    Priority: High   Schizoaffective disorder, depressive type ([place]) 04/18/2021   Genital warts 05/21/2020   Syphilis 05/21/2020   Cervical strain 09/16/2018   Assessment of effects of psychotropic drug in patient at risk for metabolic syndrome [id number]   Hypertension 10/05/2016   MDD (major depressive disorder), recurrent severe, without psychosis ([place]) 02/25/2016   Cannabis use disorder, severe, dependence ([place]) 02/25/2016   Bipolar disorder ([place]) 06/21/2014   Anxiety 06/21/2014   Cigarette smoker 06/21/2014   Dyslipidemia 06/20/2014    Patient's Medications  New Prescriptions   No medications on file  Previous Medications   BENZONATATE (TESSALON) 100 MG CAPSULE    Take 1 capsule (100 mg total) by mouth 3 (three) times daily as needed for cough.   CABOTEGRAVIR & RILPIVIRINE ER (CABENUVA) 600 & 900 MG/3ML INJECTION    Inject 1 kit into the muscle every 30 (thirty) days.   CABOTEGRAVIR & RILPIVIRINE ER (CABENUVA) 600 & 900 MG/3ML INJECTION    Inject 1 kit into the muscle every 2 (two) months.   DARUNAVIR-COBICISTAT (PREZCOBIX) 800-150 MG TABLET    TAKE 1 TABLET BY MOUTH EVERY DAY WITH FOOD. SWALLOW WHOLE. DO NOT CRUSH, BREAK, AND CHEW TABLETS   ESCITALOPRAM (LEXAPRO) 20 MG TABLET    Take 20 mg by mouth daily.   IBUPROFEN (ADVIL) 800 MG TABLET    Take 1 tablet (800 mg total) by mouth every 8 (eight) hours as needed (pain).   IMIQUIMOD (ALDARA) 5 % CREAM    Apply topically 3 (three) times a week.   OLANZAPINE (ZYPREXA) 20 MG TABLET    Take 20 mg by mouth at bedtime.   TRIUMEQ 600-50-300 MG TABLET    TAKE 1 TABLET BY MOUTH DAILY  Modified Medications   No medications on file  Discontinued Medications   OLANZAPINE (ZYPREXA) 10 MG TABLET    Take 10 mg by mouth at bedtime.    Subjective: Jeffery [name] is in for his routine HIV follow-up visit.  Last October he is switched from his oral regimen  to Cabenuva injections.  He has had a little bit of discomfort at the injection sites but feels like he is tolerating them well.  He is very happy with his decision to change.  I prescribed Aldara cream at the time of his last visit for his perianal warts.  He used the cream for short period of time.  He did not have any irritation he did not notice any change in the warts.  Lately he has been having some pain and thinks he may have a "fissure".  He notes irritation and occasional spotting of blood when he wipes with toilet paper.  He says that his mood continues to be up and down.  Review of Systems: Review of Systems  Constitutional:  Negative for fever and weight loss.  Gastrointestinal:        As noted in HPI.  Psychiatric/Behavioral:  Positive for depression. The patient is nervous/anxious.     Past Medical History:  Diagnosis Date   Anxiety    Chlamydia    Depression    Drug induced constipation    Hemorrhoids    HIV infection ([place])    Hyperlipidemia    Hypertension    Pt went off meds on his own; has been monitored without any problems   Male-to-male transgender person  former history    Nicotine dependence    Syphilis 2013 and 2015    Social History   Tobacco Use   Smoking status: Light Smoker    Packs/day: 0.25    Types: Cigarettes   Smokeless tobacco: Never   Tobacco comments:    7-8 cigarettes a day  Vaping Use   Vaping Use: Never used  Substance Use Topics   Alcohol use: Yes    Alcohol/week: 0.0 standard drinks of alcohol    Comment: occasional   Drug use: Not Currently    Frequency: 3.0 times per week    Types: Marijuana    Family History  Problem Relation Age of Onset   Hypertension Father    Hypertension Maternal Grandmother     Allergies  Allergen Reactions   Bee Venom Swelling and Other (See Comments)    Large local reactions   Lisinopril Swelling and Other (See Comments)    Facial and lip swelling    Health Maintenance  Topic  Date Due   Medicare Annual Wellness (AWV)  Never done   HPV VACCINES (3 - Risk male 3-dose series) 11/20/2015   COVID-19 Vaccine (2 - Pfizer risk series) 05/19/2022   DTaP/Tdap/Td (2 - Td or Tdap) 06/16/2026   INFLUENZA VACCINE  Completed   Hepatitis C Screening  Completed   HIV Screening  Completed    Objective:  Vitals:   09/02/22 1021  BP: 131/89  Pulse: 84  Resp: 16  Temp: 97.8 F (36.6 C)  TempSrc: Oral  SpO2: 97%  Weight: 158 lb 12.8 oz (72 kg)  Height: '5\' 7"'$  (1.702 m)   Body mass index is 24.87 kg/m.  Physical Exam Constitutional:      Comments: He is in good spirits.  Cardiovascular:     Rate and Rhythm: Normal rate.  Pulmonary:     Effort: Pulmonary effort is normal.  Genitourinary:    Comments: He has multiple perianal warts that are actually a little bigger than on his last visit.  I do not note any open areas, fluctuance or blood. Psychiatric:        Mood and Affect: Mood normal.     Lab Results Lab Results  Component Value Date   WBC 11.8 (H) 04/28/2022   HGB 15.1 04/28/2022   HCT 44.5 04/28/2022   MCV 97.4 04/28/2022   PLT 335 04/28/2022    Lab Results  Component Value Date   CREATININE 1.04 04/28/2022   BUN 8 04/28/2022   NA 138 04/28/2022   K 4.1 04/28/2022   CL 105 04/28/2022   CO2 26 04/28/2022    Lab Results  Component Value Date   ALT 10 04/28/2022   AST 14 04/28/2022   ALKPHOS 85 04/18/2021   BILITOT 0.4 04/28/2022    Lab Results  Component Value Date   CHOL 319 (H) 04/18/2021   HDL 49 04/18/2021   LDLCALC 251 (H) 04/18/2021   TRIG 96 04/18/2021   CHOLHDL 6.5 04/18/2021   Lab Results  Component Value Date   LABRPR REACTIVE (A) 04/28/2022   RPRTITER 1:8 (H) 04/28/2022   HIV 1 RNA Quant (Copies/mL)  Date Value  07/02/2022 Not Detected  04/28/2022 Not Detected  04/29/2021 Not Detected   CD4 T Cell Abs (/uL)  Date Value  04/28/2022 1,153  04/29/2021 1,040  05/06/2020 858     Problem List Items Addressed This  Visit       High   HIV disease ([place]) - Primary  His infection has remained under excellent control since switching to Cabenuva injections.  He is due for injections today and repeat lab work.  He will follow-up in 2 months.      Relevant Orders   T-helper cells (CD4) count (not at [facility])   HIV-1 RNA quant-no reflex-bld     Unprioritized   Bipolar disorder ([place])    Anxiety and depression continues to wax and wane.      Genital warts    I will obtain an anal Pap today.      Other Visit Diagnoses     Screening for rectal cancer       Relevant Orders   Cytology - PAP( [place])         Michel Bickers, MD [facility] [place] [phone] pager   [phone] cell 09/02/2022, 10:50 AM

## 2022-09-02 NOTE — Assessment & Plan Note (Signed)
Anxiety and depression continues to wax and wane.

## 2022-09-03 ENCOUNTER — Encounter: Payer: Medicare Other | Admitting: Pharmacist

## 2022-09-03 LAB — T-HELPER CELLS (CD4) COUNT (NOT AT ARMC)
CD4 % Helper T Cell: 34 % (ref 33–65)
CD4 T Cell Abs: 1196 /uL (ref 400–1790)

## 2022-09-04 LAB — HIV-1 RNA QUANT-NO REFLEX-BLD
HIV 1 RNA Quant: NOT DETECTED Copies/mL
HIV-1 RNA Quant, Log: NOT DETECTED Log cps/mL

## 2022-09-07 LAB — CYTOLOGY - PAP: Diagnosis: NEGATIVE

## 2022-10-16 ENCOUNTER — Other Ambulatory Visit (HOSPITAL_COMMUNITY): Payer: Self-pay

## 2022-10-16 ENCOUNTER — Other Ambulatory Visit: Payer: Self-pay

## 2022-10-19 ENCOUNTER — Encounter (HOSPITAL_COMMUNITY): Payer: Self-pay

## 2022-10-19 ENCOUNTER — Ambulatory Visit (HOSPITAL_COMMUNITY)
Admission: EM | Admit: 2022-10-19 | Discharge: 2022-10-19 | Disposition: A | Discharge status: Home / Self Care | Payer: Medicare (Managed Care) | Attending: Internal Medicine | Admitting: Internal Medicine

## 2022-10-19 ENCOUNTER — Telehealth: Payer: Self-pay

## 2022-10-19 DIAGNOSIS — L259 Unspecified contact dermatitis, unspecified cause: Secondary | ICD-10-CM

## 2022-10-19 MED ORDER — METHYLPREDNISOLONE ACETATE 40 MG/ML IJ SUSP
INTRAMUSCULAR | Status: AC
  Filled 2022-10-19: qty 1

## 2022-10-19 MED ORDER — METHYLPREDNISOLONE ACETATE 40 MG/ML IJ SUSP
40.0000 mg | Freq: Once | INTRAMUSCULAR | Status: AC
  Administered 2022-10-19: 40 mg via INTRAMUSCULAR

## 2022-10-19 NOTE — Telephone Encounter (Signed)
[facility] Patient Advocate Encounter  Patient's medication Renaldo Harrison) have been couriered to [facility] from [employer] and will be administered on the patient next office visit on 10/26/22.  [name] , CPhT Specialty Pharmacy Patient [facility] Phone: [phone] Fax:  [phone]

## 2022-10-19 NOTE — ED Triage Notes (Signed)
Pt states a 2 week rash all over his body. Pt is taking Benadryl. Pt has switch soaps a week ago.

## 2022-10-19 NOTE — ED Provider Notes (Addendum)
[facility]   Note:  This document was prepared using Dragon voice recognition software and may include unintentional dictation errors.  MRN: [id number] DOB: Aug 04, 1990 DATE: 10/19/22   Subjective:  Chief Complaint:  Chief Complaint  Patient presents with   Rash     HPI: Jeffery [name] is a 32 y.o. male presenting for rash for the past 2 weeks.  Patient states he started using a new cologne and was spraying it on his chest when he noticed the rash developed shortly after that.  He thought the rash was possibly from his new cologne.  He took over-the-counter Benadryl and reports slight improvement with the rash, but no resolution. He reports no one in his house with a similar rash.  Slight irritation, but no pruritus. Denies fever, discharge, pruritus, pain. Endorses rash. Presents NAD.  Prior to Admission medications   Medication Sig Start Date End Date Taking? Authorizing Provider  benzonatate (TESSALON) 100 MG capsule Take 1 capsule (100 mg total) by mouth 3 (three) times daily as needed for cough. 06/26/22  Yes Banister, Janace Aris, MD  cabotegravir & rilpivirine ER (CABENUVA) 600 & 900 MG/3ML injection Inject 1 kit into the muscle every 2 (two) months. 06/01/22  Yes Kuppelweiser, [name] L, RPH-CPP  escitalopram (LEXAPRO) 20 MG tablet Take 20 mg by mouth daily. 04/17/22  Yes [provider]  OLANZapine (ZYPREXA) 20 MG tablet Take 20 mg by mouth at bedtime. 08/13/22  Yes [provider]  cabotegravir & rilpivirine ER (CABENUVA) 600 & 900 MG/3ML injection Inject 1 kit into the muscle every 30 (thirty) days. 06/01/22   Kuppelweiser, [name] L, RPH-CPP  darunavir-cobicistat (PREZCOBIX) 800-150 MG tablet TAKE 1 TABLET BY MOUTH EVERY DAY WITH FOOD. SWALLOW WHOLE. DO NOT CRUSH, BREAK, AND CHEW TABLETS Patient not taking: Reported on 09/02/2022 05/11/22   Cliffton Asters, MD  ibuprofen (ADVIL) 800 MG tablet Take 1 tablet (800 mg total) by mouth every 8 (eight) hours as  needed (pain). 06/26/22   Zenia Resides, MD  imiquimod Mathis Dad) 5 % cream Apply topically 3 (three) times a week. Patient not taking: Reported on 09/02/2022 04/29/22   Cliffton Asters, MD  TRIUMEQ 600-50-300 MG tablet TAKE 1 TABLET BY MOUTH DAILY Patient not taking: Reported on 09/02/2022 05/11/22   Cliffton Asters, MD     Allergies  Allergen Reactions   Bee Venom Swelling and Other (See Comments)    Large local reactions   Lisinopril Swelling and Other (See Comments)    Facial and lip swelling    History:   Past Medical History:  Diagnosis Date   Anxiety    Chlamydia    Depression    Drug induced constipation    Hemorrhoids    HIV infection    Hyperlipidemia    Hypertension    Pt went off meds on his own; has been monitored without any problems   Male-to-male transgender person    former history    Nicotine dependence    Syphilis 2013 and 2015     History reviewed. No pertinent surgical history.  Family History  Problem Relation Age of Onset   Hypertension Father    Hypertension Maternal Grandmother     Social History   Tobacco Use   Smoking status: Light Smoker    Packs/day: .25    Types: Cigarettes   Smokeless tobacco: Never   Tobacco comments:    7-8 cigarettes a day  Vaping Use   Vaping Use: Never used  Substance Use Topics  Alcohol use: Yes    Alcohol/week: 0.0 standard drinks of alcohol    Comment: occasional   Drug use: Not Currently    Frequency: 3.0 times per week    Types: Marijuana    Review of Systems  Constitutional:  Negative for fever.  HENT:  Negative for congestion and rhinorrhea.   Respiratory:  Negative for cough.   Gastrointestinal:  Negative for nausea and vomiting.  Skin:  Positive for rash.     Objective:   Vitals: BP 113/84 (BP Location: Left Arm)   Pulse 88   Temp 97.8 F (36.6 C) (Oral)   Resp 18   SpO2 96%   Physical Exam Constitutional:      General: He is not in acute distress.    Appearance: Normal  appearance. He is well-developed and normal weight. He is not ill-appearing or toxic-appearing.  HENT:     Head: Normocephalic and atraumatic.  Cardiovascular:     Rate and Rhythm: Normal rate and regular rhythm.     Heart sounds: Normal heart sounds.  Pulmonary:     Effort: Pulmonary effort is normal.     Breath sounds: Normal breath sounds.     Comments: Clear to auscultation bilaterally  Abdominal:     General: Bowel sounds are normal.     Palpations: Abdomen is soft.     Tenderness: There is no abdominal tenderness.  Skin:    General: Skin is warm and dry.     Findings: Rash present. Rash is macular and papular.     Comments: Patient has pink circular maculopapular rash on trunk and bilateral upper extremities. No warmth, erythema, discharge.  Neurological:     General: No focal deficit present.     Mental Status: He is alert.  Psychiatric:        Mood and Affect: Mood and affect normal.     Results:  Labs: No results found for this or any previous visit (from the past 24 hour(s)).  Radiology: No results found.   UC Course/Treatments:  Procedures: Procedures   Medications Ordered in UC: Medications  methylPREDNISolone acetate (DEPO-MEDROL) injection 40 mg (has no administration in time range)     Assessment and Plan :     ICD-10-CM   1. Contact dermatitis, unspecified contact dermatitis type, unspecified trigger  L25.9      Contact dermatitis: Afebrile, nontoxic-appearing, NAD. VSS. DDX includes but not limited to: Contact dermatitis, scabies, eczema, psoriasis Suspect contact dermatitis given new cologne as well as distribution confined to the trunk and upper extremities. Methylprednisolone  IM was given today in office.  If no improvement in 2 to 3 days, recommend follow-up with PCP.  Recommend over-the-counter antihistamine such as Zyrtec if pruritus develops.  Strict ED precautions were given and patient verbalized understanding.   ED Discharge  Orders     None        PDMP not reviewed this encounter.     Deloyce Walthers P, PA-C 10/19/22 1620    Karolyne Timmons P, PA-C 10/19/22 1621

## 2022-10-19 NOTE — Discharge Instructions (Addendum)
You were given an injection (Methylprednisolone) for your rash today. This will help with the rash.   Return in 2 to 3 days if no improvement. New or worsening symptoms such as shortness of breath, difficulty breathing, or lips swelling, go directly to the ER.

## 2022-10-26 ENCOUNTER — Ambulatory Visit: Payer: Medicare (Managed Care) | Admitting: Pharmacist

## 2022-10-27 ENCOUNTER — Other Ambulatory Visit (HOSPITAL_COMMUNITY)
Admission: RE | Admit: 2022-10-27 | Discharge: 2022-10-27 | Disposition: A | Discharge status: Home / Self Care | Payer: Medicare (Managed Care) | Source: Ambulatory Visit | Attending: Internal Medicine | Admitting: Internal Medicine

## 2022-10-27 ENCOUNTER — Ambulatory Visit (INDEPENDENT_AMBULATORY_CARE_PROVIDER_SITE_OTHER): Payer: Medicare (Managed Care) | Admitting: Pharmacist

## 2022-10-27 ENCOUNTER — Other Ambulatory Visit: Payer: Self-pay

## 2022-10-27 DIAGNOSIS — Z113 Encounter for screening for infections with a predominantly sexual mode of transmission: Secondary | ICD-10-CM | POA: Insufficient documentation

## 2022-10-27 DIAGNOSIS — B20 Human immunodeficiency virus [HIV] disease: Secondary | ICD-10-CM

## 2022-10-27 DIAGNOSIS — Z23 Encounter for immunization: Secondary | ICD-10-CM

## 2022-10-27 MED ORDER — CABOTEGRAVIR & RILPIVIRINE ER 600 & 900 MG/3ML IM SUER
1.0000 | Freq: Once | INTRAMUSCULAR | Status: AC
Start: 2022-10-27 — End: 2022-10-27
  Administered 2022-10-27: 1 via INTRAMUSCULAR

## 2022-10-27 NOTE — Progress Notes (Signed)
HPI: Jeffery [name] is a 32 y.o. male who presents to the [facility] pharmacy clinic for [place] administration.  Patient Active Problem List   Diagnosis Date Noted   Schizoaffective disorder, depressive type 04/18/2021   Genital warts 05/21/2020   Syphilis 05/21/2020   Cervical strain 09/16/2018   Assessment of effects of psychotropic drug in patient at risk for metabolic syndrome 09/16/2018   Hypertension 10/05/2016   MDD (major depressive disorder), recurrent severe, without psychosis 02/25/2016   Cannabis use disorder, severe, dependence 02/25/2016   Bipolar disorder 06/21/2014   Anxiety 06/21/2014   Cigarette smoker 06/21/2014   HIV disease 06/20/2014   Dyslipidemia 06/20/2014    Patient's Medications  New Prescriptions   No medications on file  Previous Medications   BENZONATATE (TESSALON) 100 MG CAPSULE    Take 1 capsule (100 mg total) by mouth 3 (three) times daily as needed for cough.   CABOTEGRAVIR & RILPIVIRINE ER (CABENUVA) 600 & 900 MG/3ML INJECTION    Inject 1 kit into the muscle every 30 (thirty) days.   CABOTEGRAVIR & RILPIVIRINE ER (CABENUVA) 600 & 900 MG/3ML INJECTION    Inject 1 kit into the muscle every 2 (two) months.   DARUNAVIR-COBICISTAT (PREZCOBIX) 800-150 MG TABLET    TAKE 1 TABLET BY MOUTH EVERY DAY WITH FOOD. SWALLOW WHOLE. DO NOT CRUSH, BREAK, AND CHEW TABLETS   ESCITALOPRAM (LEXAPRO) 20 MG TABLET    Take 20 mg by mouth daily.   IBUPROFEN (ADVIL) 800 MG TABLET    Take 1 tablet (800 mg total) by mouth every 8 (eight) hours as needed (pain).   IMIQUIMOD (ALDARA) 5 % CREAM    Apply topically 3 (three) times a week.   OLANZAPINE (ZYPREXA) 20 MG TABLET    Take 20 mg by mouth at bedtime.   TRIUMEQ 600-50-300 MG TABLET    TAKE 1 TABLET BY MOUTH DAILY  Modified Medications   No medications on file  Discontinued Medications   No medications on file    Allergies: Allergies  Allergen Reactions   Bee Venom Swelling and Other (See Comments)    Large local  reactions   Lisinopril Swelling and Other (See Comments)    Facial and lip swelling    Past Medical History: Past Medical History:  Diagnosis Date   Anxiety    Chlamydia    Depression    Drug induced constipation    Hemorrhoids    HIV infection    Hyperlipidemia    Hypertension    Pt went off meds on his own; has been monitored without any problems   Male-to-male transgender person    former history    Nicotine dependence    Syphilis 2013 and 2015    Social History: Social History   Socioeconomic History   Marital status: Single    Spouse name: Not on file   Number of children: Not on file   Years of education: Not on file   Highest education level: Not on file  Occupational History   Occupation: restraunt    Employer: [employer]  Tobacco Use   Smoking status: Light Smoker    Packs/day: .25    Types: Cigarettes   Smokeless tobacco: Never   Tobacco comments:    7-8 cigarettes a day  Vaping Use   Vaping Use: Never used  Substance and Sexual Activity   Alcohol use: Yes    Alcohol/week: 0.0 standard drinks of alcohol    Comment: occasional   Drug use: Not Currently  Frequency: 3.0 times per week    Types: Marijuana   Sexual activity: Not Currently    Partners: Male    Comment: given condoms  Other Topics Concern   Not on file  Social History Narrative   Not on file   Social Determinants of Health   Financial Resource Strain: Low Risk  (09/16/2018)   Overall Financial Resource Strain (CARDIA)    Difficulty of Paying Living Expenses: Not hard at all  Food Insecurity: No Food Insecurity (09/16/2018)   Hunger Vital Sign    Worried About Running Out of Food in the Last Year: Never true    Ran Out of Food in the Last Year: Never true  Transportation Needs: No Transportation Needs (09/16/2018)   PRAPARE - Administrator, Civil Service (Medical): No    Lack of Transportation (Non-Medical): No  Physical Activity: Insufficiently Active (09/16/2018)    Exercise Vital Sign    Days of Exercise per Week: 1 day    Minutes of Exercise per Session: 30 min  Stress: Not on file  Social Connections: Unknown (09/16/2018)   Social Connection and Isolation Panel [NHANES]    Frequency of Communication with Friends and Family: Once a week    Frequency of Social Gatherings with Friends and Family: Once a week    Attends Religious Services: Never    Database administrator or Organizations: Patient declined    Attends Banker Meetings: Patient declined    Marital Status: Patient declined    Labs: Lab Results  Component Value Date   HIV1RNAQUANT Not Detected 09/02/2022   HIV1RNAQUANT Not Detected 07/02/2022   HIV1RNAQUANT Not Detected 04/28/2022   CD4TABS 1,196 09/02/2022   CD4TABS 1,153 04/28/2022   CD4TABS 1,040 04/29/2021    RPR and STI Lab Results  Component Value Date   LABRPR REACTIVE (A) 04/28/2022   LABRPR REACTIVE (A) 04/29/2021   LABRPR Reactive (A) 04/18/2021   LABRPR REACTIVE (A) 05/06/2020   LABRPR REACTIVE (A) 05/04/2019   RPRTITER 1:8 (H) 04/28/2022   RPRTITER 1:8 (H) 04/29/2021   RPRTITER 1:16 (H) 05/06/2020   RPRTITER 1:4 (H) 05/04/2019   RPRTITER 1:4 (H) 04/19/2018    STI Results GC CT  06/03/2022 11:47 AM Negative    Negative    Negative  Negative    Negative    Negative   10/03/2019 12:00 AM Negative  Negative   12/01/2017 12:00 AM Negative  C **POSITIVE**  C  08/24/2014 12:00 AM NG: Negative  CT: Negative   06/07/2014 12:00 AM NG: Negative  CT: Negative     C Corrected result   Multiple values from one day are sorted in reverse-chronological order    Hepatitis B Lab Results  Component Value Date   HEPBSAB POS (A) 06/07/2014   HEPBSAG NON REACTIVE 04/18/2021   HEPBCAB NON REACTIVE 06/07/2014   Hepatitis C No results found for: "HEPCAB", "HCVRNAPCRQN" Hepatitis A Lab Results  Component Value Date   HAV NON REACTIVE 06/07/2014   Lipids: Lab Results  Component Value Date   CHOL  319 (H) 04/18/2021   TRIG 96 04/18/2021   HDL 49 04/18/2021   CHOLHDL 6.5 04/18/2021   VLDL 19 04/18/2021   LDLCALC 251 (H) 04/18/2021    TARGET DATE: 29th of each month   Assessment: Lajuan presents today for his maintenance Cabenuva injections. Past injections were tolerated well without issues. Last HIV RNA 09/02/22 undetectable, but he was a recent Cabenuva initiation, so will re-check HIV RNA  during this visit . Last CD4 09/02/22 WNL.   Administered cabotegravir /74mL in left upper outer quadrant of the gluteal muscle. Administered rilpivirine 900 mg/61mL in the right upper outer quadrant of the gluteal muscle. No issues with injections. HE will follow up in 2 months for next set of injections.  Discussed with patient that he is eligible for a number of vaccinations including the PCV20 vaccine, the Havrix vaccine series, and the second dose of the meningococcal vaccine series.  -  PCV20 administered R deltoid - Havrix #1/2 administered L deltoid  - Meningococcal vaccine #2/2 administered R deltoid   Rash, received steroid injection- said rash started a couple of weeks  Said tolerating injections well and pain was decreasing with subsequent visits   Plan: - Cabenuva injections administered - Follow-up HIV RNA  - Follow-up urine/rectal/oral cytologies, RPR  - Next injections scheduled for 12/28/22 with [name]  - Call with any issues or questions  [name], PharmD PGY-2 Infectious Diseases Resident  10/27/2022 1:45 PM

## 2022-10-28 LAB — CYTOLOGY, (ORAL, ANAL, URETHRAL) ANCILLARY ONLY
Chlamydia: NEGATIVE
Chlamydia: NEGATIVE
Comment: NEGATIVE
Comment: NEGATIVE
Comment: NORMAL
Comment: NORMAL
Neisseria Gonorrhea: NEGATIVE
Neisseria Gonorrhea: NEGATIVE

## 2022-10-28 LAB — URINE CYTOLOGY ANCILLARY ONLY
Chlamydia: NEGATIVE
Comment: NEGATIVE
Comment: NORMAL
Neisseria Gonorrhea: NEGATIVE

## 2022-11-03 ENCOUNTER — Telehealth: Payer: Self-pay | Admitting: Pharmacist

## 2022-11-03 LAB — RPR: RPR Ser Ql: REACTIVE — AB

## 2022-11-03 LAB — T PALLIDUM AB: T Pallidum Abs: POSITIVE — AB

## 2022-11-03 LAB — HIV-1 RNA QUANT-NO REFLEX-BLD
HIV 1 RNA Quant: NOT DETECTED Copies/mL
HIV-1 RNA Quant, Log: NOT DETECTED Log cps/mL

## 2022-11-03 LAB — RPR TITER: RPR Titer: 1:512 {titer} — ABNORMAL HIGH

## 2022-11-03 NOTE — Telephone Encounter (Signed)
Called Jeffery [name] about positive RPR titer 1:512 from his appointment on 10/27/22. He reported a rash during that visit for which he received a steroid injection. He had no other symptoms of syphilis at that time. In October 2023, Jeffery [name]'s RPR titer was 1:8. Based on this information, Jeffery [name] likely has secondary syphilis. He has been scheduled for an appointment 11/04/22 [name] to receive treatment with Bicillin LA 2.4 million units x1.   [name], PharmD PGY-2 Infectious Diseases Resident  11/03/2022 3:35 PM

## 2022-11-04 ENCOUNTER — Other Ambulatory Visit: Payer: Self-pay

## 2022-11-04 ENCOUNTER — Ambulatory Visit (INDEPENDENT_AMBULATORY_CARE_PROVIDER_SITE_OTHER): Payer: Medicare (Managed Care) | Admitting: Pharmacist

## 2022-11-04 DIAGNOSIS — A539 Syphilis, unspecified: Secondary | ICD-10-CM

## 2022-11-04 MED ORDER — PENICILLIN G BENZATHINE 1200000 UNIT/2ML IM SUSY
1.2000 10*6.[IU] | PREFILLED_SYRINGE | Freq: Once | INTRAMUSCULAR | Status: AC
Start: 2022-11-04 — End: 2022-11-04
  Administered 2022-11-04: 1.2 10*6.[IU] via INTRAMUSCULAR

## 2022-11-04 NOTE — Progress Notes (Signed)
HPI: Jeffery [name] is a 32 y.o. male who presents to the [facility] pharmacy clinic for syphilis treatment.  Patient Active Problem List   Diagnosis Date Noted   Schizoaffective disorder, depressive type (HCC) 04/18/2021   Genital warts 05/21/2020   Syphilis 05/21/2020   Cervical strain 09/16/2018   Assessment of effects of psychotropic drug in patient at risk for metabolic syndrome 09/16/2018   Hypertension 10/05/2016   MDD (major depressive disorder), recurrent severe, without psychosis (HCC) 02/25/2016   Cannabis use disorder, severe, dependence (HCC) 02/25/2016   Bipolar disorder (HCC) 06/21/2014   Anxiety 06/21/2014   Cigarette smoker 06/21/2014   HIV disease (HCC) 06/20/2014   Dyslipidemia 06/20/2014    Patient's Medications  New Prescriptions   No medications on file  Previous Medications   BENZONATATE (TESSALON) 100 MG CAPSULE    Take 1 capsule (100 mg total) by mouth 3 (three) times daily as needed for cough.   CABOTEGRAVIR & RILPIVIRINE ER (CABENUVA) 600 & 900 MG/3ML INJECTION    Inject 1 kit into the muscle every 30 (thirty) days.   CABOTEGRAVIR & RILPIVIRINE ER (CABENUVA) 600 & 900 MG/3ML INJECTION    Inject 1 kit into the muscle every 2 (two) months.   DARUNAVIR-COBICISTAT (PREZCOBIX) 800-150 MG TABLET    TAKE 1 TABLET BY MOUTH EVERY DAY WITH FOOD. SWALLOW WHOLE. DO NOT CRUSH, BREAK, AND CHEW TABLETS   ESCITALOPRAM (LEXAPRO) 20 MG TABLET    Take 20 mg by mouth daily.   IBUPROFEN (ADVIL) 800 MG TABLET    Take 1 tablet (800 mg total) by mouth every 8 (eight) hours as needed (pain).   IMIQUIMOD (ALDARA) 5 % CREAM    Apply topically 3 (three) times a week.   OLANZAPINE (ZYPREXA) 20 MG TABLET    Take 20 mg by mouth at bedtime.   TRIUMEQ 600-50-300 MG TABLET    TAKE 1 TABLET BY MOUTH DAILY  Modified Medications   No medications on file  Discontinued Medications   No medications on file    Allergies: Allergies  Allergen Reactions   Bee Venom Swelling and Other (See  Comments)    Large local reactions   Lisinopril Swelling and Other (See Comments)    Facial and lip swelling    Past Medical History: Past Medical History:  Diagnosis Date   Anxiety    Chlamydia    Depression    Drug induced constipation    Hemorrhoids    HIV infection (HCC)    Hyperlipidemia    Hypertension    Pt went off meds on his own; has been monitored without any problems   Male-to-male transgender person    former history    Nicotine dependence    Syphilis 2013 and 2015    Social History: Social History   Socioeconomic History   Marital status: Single    Spouse name: Not on file   Number of children: Not on file   Years of education: Not on file   Highest education level: Not on file  Occupational History   Occupation: restraunt    Employer: [employer]  Tobacco Use   Smoking status: Light Smoker    Packs/day: .25    Types: Cigarettes   Smokeless tobacco: Never   Tobacco comments:    7-8 cigarettes a day  Vaping Use   Vaping Use: Never used  Substance and Sexual Activity   Alcohol use: Yes    Alcohol/week: 0.0 standard drinks of alcohol    Comment: occasional  Drug use: Not Currently    Frequency: 3.0 times per week    Types: Marijuana   Sexual activity: Not Currently    Partners: Male    Comment: given condoms  Other Topics Concern   Not on file  Social History Narrative   Not on file   Social Determinants of Health   Financial Resource Strain: Low Risk  (09/16/2018)   Overall Financial Resource Strain (CARDIA)    Difficulty of Paying Living Expenses: Not hard at all  Food Insecurity: No Food Insecurity (09/16/2018)   Hunger Vital Sign    Worried About Running Out of Food in the Last Year: Never true    Ran Out of Food in the Last Year: Never true  Transportation Needs: No Transportation Needs (09/16/2018)   PRAPARE - Administrator, Civil Service (Medical): No    Lack of Transportation (Non-Medical): No  Physical Activity:  Insufficiently Active (09/16/2018)   Exercise Vital Sign    Days of Exercise per Week: 1 day    Minutes of Exercise per Session: 30 min  Stress: Not on file  Social Connections: Unknown (09/16/2018)   Social Connection and Isolation Panel [NHANES]    Frequency of Communication with Friends and Family: Once a week    Frequency of Social Gatherings with Friends and Family: Once a week    Attends Religious Services: Never    Database administrator or Organizations: Patient declined    Attends Banker Meetings: Patient declined    Marital Status: Patient declined    Labs: Lab Results  Component Value Date   HIV1RNAQUANT Not Detected 10/27/2022   HIV1RNAQUANT Not Detected 09/02/2022   HIV1RNAQUANT Not Detected 07/02/2022   CD4TABS 1,196 09/02/2022   CD4TABS 1,153 04/28/2022   CD4TABS 1,040 04/29/2021    RPR and STI Lab Results  Component Value Date   LABRPR REACTIVE (A) 10/27/2022   LABRPR REACTIVE (A) 04/28/2022   LABRPR REACTIVE (A) 04/29/2021   LABRPR Reactive (A) 04/18/2021   LABRPR REACTIVE (A) 05/06/2020   RPRTITER 1:512 (H) 10/27/2022   RPRTITER 1:8 (H) 04/28/2022   RPRTITER 1:8 (H) 04/29/2021   RPRTITER 1:16 (H) 05/06/2020   RPRTITER 1:4 (H) 05/04/2019    STI Results GC CT  10/27/2022  2:23 PM Negative    Negative    Negative  Negative    Negative    Negative   06/03/2022 11:47 AM Negative    Negative    Negative  Negative    Negative    Negative   10/03/2019 12:00 AM Negative  Negative   12/01/2017 12:00 AM Negative  C **POSITIVE**  C  08/24/2014 12:00 AM NG: Negative  CT: Negative   06/07/2014 12:00 AM NG: Negative  CT: Negative     C Corrected result   Multiple values from one day are sorted in reverse-chronological order    Hepatitis B Lab Results  Component Value Date   HEPBSAB POS (A) 06/07/2014   HEPBSAG NON REACTIVE 04/18/2021   HEPBCAB NON REACTIVE 06/07/2014   Hepatitis C No results found for: "HEPCAB",  "HCVRNAPCRQN" Hepatitis A Lab Results  Component Value Date   HAV NON REACTIVE 06/07/2014   Lipids: Lab Results  Component Value Date   CHOL 319 (H) 04/18/2021   TRIG 96 04/18/2021   HDL 49 04/18/2021   CHOLHDL 6.5 04/18/2021   VLDL 19 04/18/2021   LDLCALC 251 (H) 04/18/2021    Assessment: Jeffery [name] presents today for syphilis treatment. He tested  positive on 10/27/22 with a titer of 1:512. Last documented RPR was 1:8 on 04/28/22. He will need Bicillin 2.4 million units x 1. He did have a rash a few weeks ago that he thought was due to his Cabenuva injections. It was most likely a rash from syphilis. No known allergies to any antibiotics. Advised to take ibuprofen or acetaminophen today if needed for injection site pain.  Administered Bicillin LA 1.2 million units in each upper outer quadrant of the left and right gluteal muscle, totaling a dose of 2.4 million units. He tolerated well. Advised to abstain from sexual activity for 10 days and to ask partners to get tested and treated. Advised that the health department should be reaching out to contact trace. Answered all questions. He will call with any issues.  Plan: - Administered Bicillin LA 2.4 million units for syphilis diagnosis - Call with any issues or questions  Leoda Smithhart L. Krystle Polcyn, PharmD, BCIDP, AAHIVP, CPP Clinical Pharmacist Practitioner Infectious Diseases Clinical Pharmacist [facility]

## 2022-11-25 ENCOUNTER — Ambulatory Visit: Payer: Medicare Other | Admitting: Pharmacist

## 2022-12-16 ENCOUNTER — Encounter: Payer: Self-pay | Admitting: Pharmacist

## 2022-12-17 ENCOUNTER — Other Ambulatory Visit (HOSPITAL_COMMUNITY): Payer: Self-pay

## 2022-12-21 ENCOUNTER — Ambulatory Visit (INDEPENDENT_AMBULATORY_CARE_PROVIDER_SITE_OTHER): Payer: Medicare (Managed Care) | Admitting: Pharmacist

## 2022-12-21 ENCOUNTER — Other Ambulatory Visit (HOSPITAL_COMMUNITY)
Admission: RE | Admit: 2022-12-21 | Discharge: 2022-12-21 | Disposition: A | Discharge status: Home / Self Care | Payer: Medicare (Managed Care) | Source: Ambulatory Visit | Attending: Infectious Disease | Admitting: Infectious Disease

## 2022-12-21 ENCOUNTER — Other Ambulatory Visit: Payer: Self-pay

## 2022-12-21 DIAGNOSIS — A539 Syphilis, unspecified: Secondary | ICD-10-CM

## 2022-12-21 DIAGNOSIS — Z202 Contact with and (suspected) exposure to infections with a predominantly sexual mode of transmission: Secondary | ICD-10-CM | POA: Diagnosis not present

## 2022-12-21 DIAGNOSIS — Z113 Encounter for screening for infections with a predominantly sexual mode of transmission: Secondary | ICD-10-CM | POA: Insufficient documentation

## 2022-12-21 DIAGNOSIS — Z8619 Personal history of other infectious and parasitic diseases: Secondary | ICD-10-CM | POA: Diagnosis not present

## 2022-12-21 DIAGNOSIS — A549 Gonococcal infection, unspecified: Secondary | ICD-10-CM | POA: Insufficient documentation

## 2022-12-21 MED ORDER — CEFTRIAXONE SODIUM 500 MG IJ SOLR
500.0000 mg | Freq: Once | INTRAMUSCULAR | Status: AC
Start: 2022-12-21 — End: 2022-12-21
  Administered 2022-12-21: 500 mg via INTRAMUSCULAR

## 2022-12-21 MED ORDER — PENICILLIN G BENZATHINE 1200000 UNIT/2ML IM SUSY
1.2000 10*6.[IU] | PREFILLED_SYRINGE | Freq: Once | INTRAMUSCULAR | Status: AC
Start: 2022-12-21 — End: 2022-12-21
  Administered 2022-12-21: 1.2 10*6.[IU] via INTRAMUSCULAR

## 2022-12-21 NOTE — Progress Notes (Signed)
12/21/2022  HPI: Jeffery [name] is a 32 y.o. male who presents to the [facility] clinic today for STI testing and treatment.  Patient Active Problem List   Diagnosis Date Noted   Schizoaffective disorder, depressive type (HCC) 04/18/2021   Genital warts 05/21/2020   Syphilis 05/21/2020   Cervical strain 09/16/2018   Assessment of effects of psychotropic drug in patient at risk for metabolic syndrome 09/16/2018   Hypertension 10/05/2016   MDD (major depressive disorder), recurrent severe, without psychosis (HCC) 02/25/2016   Cannabis use disorder, severe, dependence (HCC) 02/25/2016   Bipolar disorder (HCC) 06/21/2014   Anxiety 06/21/2014   Cigarette smoker 06/21/2014   HIV disease (HCC) 06/20/2014   Dyslipidemia 06/20/2014    Patient's Medications  New Prescriptions   No medications on file  Previous Medications   BENZONATATE (TESSALON) 100 MG CAPSULE    Take 1 capsule (100 mg total) by mouth 3 (three) times daily as needed for cough.   CABOTEGRAVIR & RILPIVIRINE ER (CABENUVA) 600 & 900 MG/3ML INJECTION    Inject 1 kit into the muscle every 30 (thirty) days.   CABOTEGRAVIR & RILPIVIRINE ER (CABENUVA) 600 & 900 MG/3ML INJECTION    Inject 1 kit into the muscle every 2 (two) months.   DARUNAVIR-COBICISTAT (PREZCOBIX) 800-150 MG TABLET    TAKE 1 TABLET BY MOUTH EVERY DAY WITH FOOD. SWALLOW WHOLE. DO NOT CRUSH, BREAK, AND CHEW TABLETS   ESCITALOPRAM (LEXAPRO) 20 MG TABLET    Take 20 mg by mouth daily.   IBUPROFEN (ADVIL) 800 MG TABLET    Take 1 tablet (800 mg total) by mouth every 8 (eight) hours as needed (pain).   IMIQUIMOD (ALDARA) 5 % CREAM    Apply topically 3 (three) times a week.   OLANZAPINE (ZYPREXA) 20 MG TABLET    Take 20 mg by mouth at bedtime.   TRIUMEQ 600-50-300 MG TABLET    TAKE 1 TABLET BY MOUTH DAILY  Modified Medications   No medications on file  Discontinued Medications   No medications on file    Allergies: Allergies  Allergen Reactions   Bee Venom Swelling  and Other (See Comments)    Large local reactions   Lisinopril Swelling and Other (See Comments)    Facial and lip swelling    Past Medical History: Past Medical History:  Diagnosis Date   Anxiety    Chlamydia    Depression    Drug induced constipation    Hemorrhoids    HIV infection (HCC)    Hyperlipidemia    Hypertension    Pt went off meds on his own; has been monitored without any problems   Male-to-male transgender person    former history    Nicotine dependence    Syphilis 2013 and 2015    Social History: Social History   Socioeconomic History   Marital status: Single    Spouse name: Not on file   Number of children: Not on file   Years of education: Not on file   Highest education level: Not on file  Occupational History   Occupation: restraunt    Employer: [employer]  Tobacco Use   Smoking status: Light Smoker    Packs/day: .25    Types: Cigarettes   Smokeless tobacco: Never   Tobacco comments:    7-8 cigarettes a day  Vaping Use   Vaping Use: Never used  Substance and Sexual Activity   Alcohol use: Yes    Alcohol/week: 0.0 standard drinks of alcohol  Comment: occasional   Drug use: Not Currently    Frequency: 3.0 times per week    Types: Marijuana   Sexual activity: Not Currently    Partners: Male    Comment: given condoms  Other Topics Concern   Not on file  Social History Narrative   Not on file   Social Determinants of Health   Financial Resource Strain: Low Risk  (09/16/2018)   Overall Financial Resource Strain (CARDIA)    Difficulty of Paying Living Expenses: Not hard at all  Food Insecurity: No Food Insecurity (09/16/2018)   Hunger Vital Sign    Worried About Running Out of Food in the Last Year: Never true    Ran Out of Food in the Last Year: Never true  Transportation Needs: No Transportation Needs (09/16/2018)   PRAPARE - Administrator, Civil Service (Medical): No    Lack of Transportation (Non-Medical): No   Physical Activity: Insufficiently Active (09/16/2018)   Exercise Vital Sign    Days of Exercise per Week: 1 day    Minutes of Exercise per Session: 30 min  Stress: Not on file  Social Connections: Unknown (09/16/2018)   Social Connection and Isolation Panel [NHANES]    Frequency of Communication with Friends and Family: Once a week    Frequency of Social Gatherings with Friends and Family: Once a week    Attends Religious Services: Never    Database administrator or Organizations: Patient declined    Attends Banker Meetings: Patient declined    Marital Status: Patient declined     Assessment: Jeffery [name] is here today for STI testing and treatment. He recently had a partner that tested positive for a STI. He is not sure which STI but states that the partner told him he received an injection(s) for treatment. He is currently not having any symptoms except for a mild cold that he has developed. He was recently treated for syphilis in April with a RPR titer of 1:512. He tolerated Bicillin well.  Since he is not sure which STI his partner was treated for, I will go ahead and treat him for syphilis and gonorrhea while he is here. Administered ceftriaxone 500 mg in right upper outer quadrant of the gluteal muscle. Administered Bicillin LA 1.2 million units in each upper outer quadrant of the left and right gluteal muscle, totaling a dose of 2.4 million units. Advised him to abstain from any sexual activity for 10 days if he tests positive for anything. He verbalized understanding.   Plan: - STI screening: RPR, urine/rectal/pharyngeal GC/CT swabs for cytology today - Ceftriaxone 500 mg IM x 1 for possible gonorrhea exposure - Bicillin 2.4 million units x 1 for possible syphilis exposure - F/u results to see if treatment is needed  Jeffery [name], PharmD, BCIDP, AAHIVP, CPP Clinical Pharmacist Practitioner Infectious Diseases Clinical Pharmacist [facility] 12/21/2022, 3:49 PM

## 2022-12-22 ENCOUNTER — Other Ambulatory Visit (HOSPITAL_COMMUNITY): Payer: Self-pay

## 2022-12-23 LAB — URINE CYTOLOGY ANCILLARY ONLY
Chlamydia: NEGATIVE
Comment: NEGATIVE
Comment: NORMAL
Neisseria Gonorrhea: NEGATIVE

## 2022-12-23 LAB — CYTOLOGY, (ORAL, ANAL, URETHRAL) ANCILLARY ONLY
Chlamydia: NEGATIVE
Chlamydia: NEGATIVE
Comment: NEGATIVE
Comment: NEGATIVE
Comment: NORMAL
Comment: NORMAL
Neisseria Gonorrhea: POSITIVE — AB
Neisseria Gonorrhea: POSITIVE — AB

## 2022-12-24 ENCOUNTER — Telehealth: Payer: Self-pay

## 2022-12-24 LAB — RPR: RPR Ser Ql: REACTIVE — AB

## 2022-12-24 LAB — RPR TITER: RPR Titer: 1:64 {titer} — ABNORMAL HIGH

## 2022-12-24 LAB — T PALLIDUM AB: T Pallidum Abs: POSITIVE — AB

## 2022-12-24 NOTE — Telephone Encounter (Signed)
[facility] Patient Advocate Encounter  Patient's medication (Cabenuva) have been couriered to [facility] from [facility] and will be administered on the patient next office visit on 12/28/22.  Meka Lewan , CPhT [facility] Patient Advocate [facility] Phone: [phone] Fax:  [phone]  

## 2022-12-28 ENCOUNTER — Encounter: Payer: Self-pay | Admitting: Pharmacist

## 2022-12-28 ENCOUNTER — Ambulatory Visit: Payer: Medicare (Managed Care) | Admitting: Pharmacist

## 2022-12-31 ENCOUNTER — Ambulatory Visit (INDEPENDENT_AMBULATORY_CARE_PROVIDER_SITE_OTHER): Payer: Medicare (Managed Care) | Admitting: Pharmacist

## 2022-12-31 ENCOUNTER — Other Ambulatory Visit: Payer: Self-pay

## 2022-12-31 ENCOUNTER — Other Ambulatory Visit (HOSPITAL_COMMUNITY)
Admission: RE | Admit: 2022-12-31 | Discharge: 2022-12-31 | Disposition: A | Discharge status: Home / Self Care | Payer: Medicare (Managed Care) | Source: Ambulatory Visit | Attending: Infectious Disease | Admitting: Infectious Disease

## 2022-12-31 DIAGNOSIS — A549 Gonococcal infection, unspecified: Secondary | ICD-10-CM

## 2022-12-31 DIAGNOSIS — Z113 Encounter for screening for infections with a predominantly sexual mode of transmission: Secondary | ICD-10-CM

## 2022-12-31 DIAGNOSIS — B20 Human immunodeficiency virus [HIV] disease: Secondary | ICD-10-CM

## 2022-12-31 MED ORDER — CABOTEGRAVIR & RILPIVIRINE ER 600 & 900 MG/3ML IM SUER
1.0000 | Freq: Once | INTRAMUSCULAR | Status: AC
Start: 2022-12-31 — End: 2022-12-31
  Administered 2022-12-31: 1 via INTRAMUSCULAR

## 2022-12-31 NOTE — Progress Notes (Signed)
HPI: Jeffery [name] is a 32 y.o. male who presents to the [facility] pharmacy clinic for [place] administration.  Patient Active Problem List   Diagnosis Date Noted   Schizoaffective disorder, depressive type (HCC) 04/18/2021   Genital warts 05/21/2020   Syphilis 05/21/2020   Cervical strain 09/16/2018   Assessment of effects of psychotropic drug in patient at risk for metabolic syndrome 09/16/2018   Hypertension 10/05/2016   MDD (major depressive disorder), recurrent severe, without psychosis (HCC) 02/25/2016   Cannabis use disorder, severe, dependence (HCC) 02/25/2016   Bipolar disorder (HCC) 06/21/2014   Anxiety 06/21/2014   Cigarette smoker 06/21/2014   HIV disease (HCC) 06/20/2014   Dyslipidemia 06/20/2014    Patient's Medications  New Prescriptions   No medications on file  Previous Medications   BENZONATATE (TESSALON) 100 MG CAPSULE    Take 1 capsule (100 mg total) by mouth 3 (three) times daily as needed for cough.   CABOTEGRAVIR & RILPIVIRINE ER (CABENUVA) 600 & 900 MG/3ML INJECTION    Inject 1 kit into the muscle every 30 (thirty) days.   CABOTEGRAVIR & RILPIVIRINE ER (CABENUVA) 600 & 900 MG/3ML INJECTION    Inject 1 kit into the muscle every 2 (two) months.   ESCITALOPRAM (LEXAPRO) 20 MG TABLET    Take 20 mg by mouth daily.   IBUPROFEN (ADVIL) 800 MG TABLET    Take 1 tablet (800 mg total) by mouth every 8 (eight) hours as needed (pain).   IMIQUIMOD (ALDARA) 5 % CREAM    Apply topically 3 (three) times a week.   OLANZAPINE (ZYPREXA) 20 MG TABLET    Take 20 mg by mouth at bedtime.  Modified Medications   No medications on file  Discontinued Medications   DARUNAVIR-COBICISTAT (PREZCOBIX) 800-150 MG TABLET    TAKE 1 TABLET BY MOUTH EVERY DAY WITH FOOD. SWALLOW WHOLE. DO NOT CRUSH, BREAK, AND CHEW TABLETS   TRIUMEQ 600-50-300 MG TABLET    TAKE 1 TABLET BY MOUTH DAILY    Allergies: Allergies  Allergen Reactions   Bee Venom Swelling and Other (See Comments)    Large  local reactions   Lisinopril Swelling and Other (See Comments)    Facial and lip swelling    Past Medical History: Past Medical History:  Diagnosis Date   Anxiety    Chlamydia    Depression    Drug induced constipation    Hemorrhoids    HIV infection (HCC)    Hyperlipidemia    Hypertension    Pt went off meds on his own; has been monitored without any problems   Male-to-male transgender person    former history    Nicotine dependence    Syphilis 2013 and 2015    Social History: Social History   Socioeconomic History   Marital status: Single    Spouse name: Not on file   Number of children: Not on file   Years of education: Not on file   Highest education level: Not on file  Occupational History   Occupation: restraunt    Employer: [employer]  Tobacco Use   Smoking status: Light Smoker    Packs/day: .25    Types: Cigarettes   Smokeless tobacco: Never   Tobacco comments:    7-8 cigarettes a day  Vaping Use   Vaping Use: Never used  Substance and Sexual Activity   Alcohol use: Yes    Alcohol/week: 0.0 standard drinks of alcohol    Comment: occasional   Drug use: Not Currently  Frequency: 3.0 times per week    Types: Marijuana   Sexual activity: Not Currently    Partners: Male    Comment: given condoms  Other Topics Concern   Not on file  Social History Narrative   Not on file   Social Determinants of Health   Financial Resource Strain: Low Risk  (09/16/2018)   Overall Financial Resource Strain (CARDIA)    Difficulty of Paying Living Expenses: Not hard at all  Food Insecurity: No Food Insecurity (09/16/2018)   Hunger Vital Sign    Worried About Running Out of Food in the Last Year: Never true    Ran Out of Food in the Last Year: Never true  Transportation Needs: No Transportation Needs (09/16/2018)   PRAPARE - Administrator, Civil Service (Medical): No    Lack of Transportation (Non-Medical): No  Physical Activity: Insufficiently Active  (09/16/2018)   Exercise Vital Sign    Days of Exercise per Week: 1 day    Minutes of Exercise per Session: 30 min  Stress: Not on file  Social Connections: Unknown (09/16/2018)   Social Connection and Isolation Panel [NHANES]    Frequency of Communication with Friends and Family: Once a week    Frequency of Social Gatherings with Friends and Family: Once a week    Attends Religious Services: Never    Database administrator or Organizations: Patient declined    Attends Banker Meetings: Patient declined    Marital Status: Patient declined    Labs: Lab Results  Component Value Date   HIV1RNAQUANT Not Detected 10/27/2022   HIV1RNAQUANT Not Detected 09/02/2022   HIV1RNAQUANT Not Detected 07/02/2022   CD4TABS 1,196 09/02/2022   CD4TABS 1,153 04/28/2022   CD4TABS 1,040 04/29/2021    RPR and STI Lab Results  Component Value Date   LABRPR REACTIVE (A) 12/21/2022   LABRPR REACTIVE (A) 10/27/2022   LABRPR REACTIVE (A) 04/28/2022   LABRPR REACTIVE (A) 04/29/2021   LABRPR Reactive (A) 04/18/2021   RPRTITER 1:64 (H) 12/21/2022   RPRTITER 1:512 (H) 10/27/2022   RPRTITER 1:8 (H) 04/28/2022   RPRTITER 1:8 (H) 04/29/2021   RPRTITER 1:16 (H) 05/06/2020    STI Results GC CT  12/21/2022  3:59 PM Positive    Positive    Negative  Negative    Negative    Negative   10/27/2022  2:23 PM Negative    Negative    Negative  Negative    Negative    Negative   06/03/2022 11:47 AM Negative    Negative    Negative  Negative    Negative    Negative   10/03/2019 12:00 AM Negative  Negative   12/01/2017 12:00 AM Negative  C **POSITIVE**  C  08/24/2014 12:00 AM NG: Negative  CT: Negative   06/07/2014 12:00 AM NG: Negative  CT: Negative     C Corrected result   Multiple values from one day are sorted in reverse-chronological order    Hepatitis B Lab Results  Component Value Date   HEPBSAB POS (A) 06/07/2014   HEPBSAG NON REACTIVE 04/18/2021   HEPBCAB NON REACTIVE  06/07/2014   Hepatitis C No results found for: "HEPCAB", "HCVRNAPCRQN" Hepatitis A Lab Results  Component Value Date   HAV NON REACTIVE 06/07/2014   Lipids: Lab Results  Component Value Date   CHOL 319 (H) 04/18/2021   TRIG 96 04/18/2021   HDL 49 04/18/2021   CHOLHDL 6.5 04/18/2021   VLDL 19 04/18/2021  LDLCALC 251 (H) 04/18/2021    TARGET DATE:  The 29th of the month  Current HIV Regimen: Cabenuva  Assessment: Jeffery [name] presents today for their maintenance Cabenuva injections. Initial/past injections were tolerated well without issues. No problems with systemic effects of injections.  Administered cabotegravir 600mg /26mL in left upper outer quadrant of the gluteal muscle. Administered rilpivirine 900 mg/88mL in the right upper outer quadrant of the gluteal muscle. Monitored patient for 10 minutes after injection. Injections were tolerated well without issue. Patient will follow up in 2 months for next injection. Will defer HIV RNA as was recently assessed in April.   Patient was recently tested and treated for gonorrhea and syphilis on 12/21/22 after one of his partners tested positive for an STI. Will recheck oral cytologies today to assess for pharyngeal gonorrhea test of cure. States he has not been sexually active since he was treated. Also is currently up-to-date on vaccines and will receive final HAV vaccine in October.   Patient transitioning to care with Rexene Alberts, NP as he was previously seen by Dr. Orvan Falconer who has now retired.   Plan: - Cabenuva injections administered - Check oral cytologies  - Next injections scheduled for 8/22 with Judeth Cornfield and 10/22 with [name]  - Call with any issues or questions  Margarite Gouge, PharmD, CPP, BCIDP, AAHIVP Clinical Pharmacist Practitioner Infectious Diseases Clinical Pharmacist [facility]

## 2023-01-01 LAB — CYTOLOGY, (ORAL, ANAL, URETHRAL) ANCILLARY ONLY
Chlamydia: NEGATIVE
Comment: NEGATIVE
Comment: NORMAL
Neisseria Gonorrhea: NEGATIVE

## 2023-02-09 ENCOUNTER — Other Ambulatory Visit (HOSPITAL_COMMUNITY): Payer: Self-pay

## 2023-02-14 ENCOUNTER — Encounter: Payer: Self-pay | Admitting: Pharmacist

## 2023-02-16 ENCOUNTER — Other Ambulatory Visit: Payer: Self-pay

## 2023-02-17 ENCOUNTER — Other Ambulatory Visit: Payer: Self-pay

## 2023-02-18 ENCOUNTER — Telehealth: Payer: Self-pay | Admitting: Pharmacy Technician

## 2023-02-18 NOTE — Telephone Encounter (Signed)
[facility] Patient Advocate Encounter  Patient's medication, Jeffery [name], has been couriered to [facility] from [facility] and will be administered at pt's next visit  02/25/23.

## 2023-02-25 ENCOUNTER — Ambulatory Visit: Payer: Medicare (Managed Care) | Admitting: Infectious Diseases

## 2023-03-01 ENCOUNTER — Ambulatory Visit (INDEPENDENT_AMBULATORY_CARE_PROVIDER_SITE_OTHER): Payer: Medicare (Managed Care) | Admitting: Infectious Diseases

## 2023-03-01 ENCOUNTER — Other Ambulatory Visit: Payer: Self-pay

## 2023-03-01 ENCOUNTER — Encounter: Payer: Self-pay | Admitting: Infectious Diseases

## 2023-03-01 ENCOUNTER — Other Ambulatory Visit (HOSPITAL_COMMUNITY)
Admission: RE | Admit: 2023-03-01 | Discharge: 2023-03-01 | Disposition: A | Discharge status: Home / Self Care | Payer: Medicare (Managed Care) | Source: Ambulatory Visit | Attending: Infectious Diseases | Admitting: Infectious Diseases

## 2023-03-01 VITALS — BP 127/81 | HR 83 | Wt 148.0 lb

## 2023-03-01 DIAGNOSIS — Z113 Encounter for screening for infections with a predominantly sexual mode of transmission: Secondary | ICD-10-CM

## 2023-03-01 DIAGNOSIS — K649 Unspecified hemorrhoids: Secondary | ICD-10-CM | POA: Diagnosis not present

## 2023-03-01 DIAGNOSIS — I10 Essential (primary) hypertension: Secondary | ICD-10-CM

## 2023-03-01 DIAGNOSIS — B20 Human immunodeficiency virus [HIV] disease: Secondary | ICD-10-CM | POA: Insufficient documentation

## 2023-03-01 DIAGNOSIS — K611 Rectal abscess: Secondary | ICD-10-CM | POA: Diagnosis not present

## 2023-03-01 DIAGNOSIS — A630 Anogenital (venereal) warts: Secondary | ICD-10-CM

## 2023-03-01 DIAGNOSIS — A539 Syphilis, unspecified: Secondary | ICD-10-CM

## 2023-03-01 MED ORDER — CABOTEGRAVIR & RILPIVIRINE ER 600 & 900 MG/3ML IM SUER
1.0000 | Freq: Once | INTRAMUSCULAR | Status: AC
Start: 2023-03-01 — End: 2023-03-01
  Administered 2023-03-01: 1 via INTRAMUSCULAR

## 2023-03-01 MED ORDER — AMOXICILLIN-POT CLAVULANATE 875-125 MG PO TABS
1.0000 | ORAL_TABLET | Freq: Two times a day (BID) | ORAL | 0 refills | Status: AC
Start: 2023-03-01 — End: 2023-03-08

## 2023-03-01 NOTE — Patient Instructions (Addendum)
It seems like you have an abscess around a hair follicle (skin infection) that is draining a bit.   I will try an antibiotic for you called Augmentin - take 1 tablet twice a day with food to see if that helps with the drainage and discomfort.  If you continue to have leaking I worry we may need you to see a surgeon if there is a concern about a fistula (extra tunnel).   04/27/2023 is your next appointment

## 2023-03-01 NOTE — Progress Notes (Unsigned)
Name: Jeffery [name]  DOB: 1991-06-19 MRN: [id number] PCP: Pcp, No    Patient Active Problem List   Diagnosis Date Noted   Schizoaffective disorder, depressive type (HCC) 04/18/2021   Genital warts 05/21/2020   Syphilis 05/21/2020   Cervical strain 09/16/2018   Assessment of effects of psychotropic drug in patient at risk for metabolic syndrome 09/16/2018   Hypertension 10/05/2016   MDD (major depressive disorder), recurrent severe, without psychosis (HCC) 02/25/2016   Cannabis use disorder, severe, dependence (HCC) 02/25/2016   Bipolar disorder (HCC) 06/21/2014   Anxiety 06/21/2014   Cigarette smoker 06/21/2014   HIV disease (HCC) 06/20/2014   Dyslipidemia 06/20/2014     Brief Narrative:  *** is a 32 y.o. *** with HIV disease, Dx ***. Marland Kitchen CD4 nadir *** VL *** HIV Risk: *** History of OIs: *** Intake Labs ***: Hep B sAg (***), sAb (***), cAb (***); Hep A (***), Hep C (***) Quantiferon (***) HLA B*5701 (***) G6PD: (***)   Previous Regimens: ***  Genotypes: ***  Subjective:   Chief Complaint  Patient presents with   Follow-up    B20-CABENUVA     HPI: Jeffery [name] is here for HIV care and Cabenuva injection. He has been on cabenuva for about 8 months now - finds that it has some discomfort associated with it but not too bad any longer as he is getting used to it.   Has had a bump on rectum that ruptured and continues to have some drainage. This was about 12d ago now. No significant tenderness but does notice it has some discomfort there at times with bowel movements.   Recently treated with gonorrhea. And had new secondary syphilis rash in April .      03/01/2023    2:19 PM  Depression screen PHQ 2/9  Decreased Interest 0  Down, Depressed, Hopeless 1  PHQ - 2 Score 1    ROS  Past Medical History:  Diagnosis Date   Anxiety    Chlamydia    Depression    Drug induced constipation    Hemorrhoids    HIV infection (HCC)    Hyperlipidemia    Hypertension     Pt went off meds on his own; has been monitored without any problems   Male-to-male transgender person    former history    Nicotine dependence    Syphilis 2013 and 2015    Outpatient Medications Prior to Visit  Medication Sig Dispense Refill   cabotegravir & rilpivirine ER (CABENUVA) 600 & 900 MG/3ML injection Inject 1 kit into the muscle every 30 (thirty) days. 6 mL 1   cabotegravir & rilpivirine ER (CABENUVA) 600 & 900 MG/3ML injection Inject 1 kit into the muscle every 2 (two) months. 6 mL 5   escitalopram (LEXAPRO) 20 MG tablet Take 20 mg by mouth daily.     ibuprofen (ADVIL) 800 MG tablet Take 1 tablet (800 mg total) by mouth every 8 (eight) hours as needed (pain). 21 tablet 0   imiquimod (ALDARA) 5 % cream Apply topically 3 (three) times a week. 12 each 2   OLANZapine (ZYPREXA) 20 MG tablet Take 20 mg by mouth at bedtime.     traZODone (DESYREL) 50 MG tablet Take 50 mg by mouth at bedtime.     benzonatate (TESSALON) 100 MG capsule Take 1 capsule (100 mg total) by mouth 3 (three) times daily as needed for cough. (Patient not taking: Reported on 03/01/2023) 21 capsule 0   No facility-administered medications prior to visit.  Allergies  Allergen Reactions   Bee Venom Swelling and Other (See Comments)    Large local reactions   Lisinopril Swelling and Other (See Comments)    Facial and lip swelling    Social History   Tobacco Use   Smoking status: Light Smoker    Current packs/day: 0.25    Types: Cigarettes   Smokeless tobacco: Never   Tobacco comments:    7-8 cigarettes a day  Vaping Use   Vaping status: Never Used  Substance Use Topics   Alcohol use: Yes    Alcohol/week: 0.0 standard drinks of alcohol    Comment: occasional   Drug use: Not Currently    Frequency: 3.0 times per week    Types: Marijuana    Family History  Problem Relation Age of Onset   Hypertension Father    Hypertension Maternal Grandmother     Social History   Substance and  Sexual Activity  Sexual Activity Not Currently   Partners: Male   Comment: given condoms     Objective:   Vitals:   03/01/23 1416  BP: 127/81  Pulse: 83  SpO2: 96%  Weight: 148 lb (67.1 kg)   Body mass index is 23.18 kg/m.  Physical Exam  Lab Results Lab Results  Component Value Date   WBC 11.8 (H) 04/28/2022   HGB 15.1 04/28/2022   HCT 44.5 04/28/2022   MCV 97.4 04/28/2022   PLT 335 04/28/2022    Lab Results  Component Value Date   CREATININE 1.04 04/28/2022   BUN 8 04/28/2022   NA 138 04/28/2022   K 4.1 04/28/2022   CL 105 04/28/2022   CO2 26 04/28/2022    Lab Results  Component Value Date   ALT 10 04/28/2022   AST 14 04/28/2022   ALKPHOS 85 04/18/2021   BILITOT 0.4 04/28/2022    Lab Results  Component Value Date   CHOL 319 (H) 04/18/2021   HDL 49 04/18/2021   LDLCALC 251 (H) 04/18/2021   TRIG 96 04/18/2021   CHOLHDL 6.5 04/18/2021   HIV 1 RNA Quant (Copies/mL)  Date Value  10/27/2022 Not Detected  09/02/2022 Not Detected  07/02/2022 Not Detected   CD4 T Cell Abs (/uL)  Date Value  09/02/2022 1,196  04/28/2022 1,153  04/29/2021 1,040     Assessment & Plan:   Problem List Items Addressed This Visit   None Visit Diagnoses     Human immunodeficiency virus (HIV) disease (HCC)    -  Primary   Relevant Medications   cabotegravir & rilpivirine ER (CABENUVA) 600 & 900 MG/3ML injection 1 kit       Rexene Alberts, MSN, NP-C [facility] Infect[facility] Pager: [phone] Office: [phone]  03/01/23  2:34 PM

## 2023-03-02 LAB — T-HELPER CELLS (CD4) COUNT (NOT AT ARMC)
CD4 % Helper T Cell: 33 % (ref 33–65)
CD4 T Cell Abs: 903 /uL (ref 400–1790)

## 2023-03-03 ENCOUNTER — Encounter: Payer: Self-pay | Admitting: Infectious Diseases

## 2023-03-03 DIAGNOSIS — K649 Unspecified hemorrhoids: Secondary | ICD-10-CM | POA: Insufficient documentation

## 2023-03-03 DIAGNOSIS — K611 Rectal abscess: Secondary | ICD-10-CM | POA: Insufficient documentation

## 2023-03-03 LAB — URINE CYTOLOGY ANCILLARY ONLY
Chlamydia: NEGATIVE
Comment: NEGATIVE
Comment: NORMAL
Neisseria Gonorrhea: NEGATIVE

## 2023-03-03 LAB — CYTOLOGY, (ORAL, ANAL, URETHRAL) ANCILLARY ONLY
Chlamydia: NEGATIVE
Chlamydia: NEGATIVE
Comment: NEGATIVE
Comment: NEGATIVE
Comment: NORMAL
Comment: NORMAL
Neisseria Gonorrhea: NEGATIVE
Neisseria Gonorrhea: NEGATIVE

## 2023-03-03 NOTE — Assessment & Plan Note (Signed)
Repeat titer today - all symptoms of secondary syphilis resolved after treatment this past April. Appropriate drop in titer.

## 2023-03-03 NOTE — Assessment & Plan Note (Signed)
BP Readings from Last 3 Encounters:  03/01/23 127/81  10/19/22 113/84  09/02/22 131/89   Well controlled

## 2023-03-03 NOTE — Assessment & Plan Note (Signed)
Very well controlled on Cabenuva injections Q53m (TD: 29th).  No concerns with access or adherence to medication. They are tolerating the medication well without side effects. No drug interactions identified. Pertinent lab tests ordered today. Reviewed labs over the last 9 years of care here with Dr. Orvan Falconer - while his adherence was a bit spotty in the setting of mental health symptoms his immune system has continued to remain > 800 throughout this. His viral loads have for the last several years now been very well controlled. He does have some resistance mutations to NRTIs which do not affect his current treatment. Will continue the cabenuva at current dose interval. Provided today.  No changes to insurance coverage.  No dental needs today.  H/O anxious/depressed mood - stable and in care with psychiatry  Sexual health and family planning discussed - asymptomatic screening collected.   Flu/COVID boosters recommended in the fall.  Will re-screen quantiferon and Hep B sAb / Hep C Ab next lab draw. Can continue with Q4-44m checks for him.   04/27/2023 is his next injection appointment with our pharmacy team.

## 2023-03-03 NOTE — Assessment & Plan Note (Signed)
Anal pap negative cytology in Feb 2024 - re screen in 1-2 years.

## 2023-03-03 NOTE — Assessment & Plan Note (Signed)
Seemed he had a superficial rectal abscess that may have stemmed from ingrown hair bump. There is a bit of drainage I can ellicit from a small sinus. Surrounding area is non-tender.  Will treat with 7d course of amoxicillin-clavulanate.  May need to refer for consideration of surgical excision as I do have a bit of worry this may be a fistula and cause ongoing trouble for him. He will let me know.

## 2023-03-10 LAB — HIV-1 RNA QUANT-NO REFLEX-BLD
HIV 1 RNA Quant: 21 {copies}/mL — ABNORMAL HIGH
HIV-1 RNA Quant, Log: 1.32 {Log_copies}/mL — ABNORMAL HIGH

## 2023-03-10 LAB — RPR TITER: RPR Titer: 1:64 {titer} — ABNORMAL HIGH

## 2023-03-10 LAB — T PALLIDUM AB: T Pallidum Abs: POSITIVE — AB

## 2023-03-10 LAB — RPR: RPR Ser Ql: REACTIVE — AB

## 2023-04-08 ENCOUNTER — Other Ambulatory Visit: Payer: Self-pay

## 2023-04-08 ENCOUNTER — Other Ambulatory Visit (HOSPITAL_COMMUNITY): Payer: Self-pay

## 2023-04-08 NOTE — Progress Notes (Signed)
Specialty Pharmacy Refill Coordination Note  Jeffery [name] is a 32 y.o. male assessed today regarding refills of clinic administered specialty medication(s) Cabotegravir & Rilpivirine   Clinic requested Courier to Provider Office   Delivery date: 04/20/23   Verified address: [facility] [address] [place] [address]   Medication will be filled on 04/19/23.

## 2023-04-21 ENCOUNTER — Telehealth: Payer: Self-pay

## 2023-04-21 NOTE — Telephone Encounter (Signed)
[facility] Patient Advocate Encounter  Patient's medications (CABENUVA) have been couriered to [facility] from [facility] pharmacy and will be administered at the patients appointment 04/27/23.  Kae Heller , CPhT Specialty Pharmacy Patient [facility] for Infectious Disease Phone: [phone] Fax:  [phone]

## 2023-04-26 NOTE — Progress Notes (Unsigned)
HPI: Markevius Dieleman is a 32 y.o. male who presents to the [facility] pharmacy clinic for [place] administration.  Patient Active Problem List   Diagnosis Date Noted   Rectal abscess 03/03/2023   Hemorrhoids    Schizoaffective disorder, depressive type (HCC) 04/18/2021   Genital warts 05/21/2020   Syphilis 05/21/2020   Assessment of effects of psychotropic drug in patient at risk for metabolic syndrome 09/16/2018   Hypertension 10/05/2016   MDD (major depressive disorder), recurrent severe, without psychosis (HCC) 02/25/2016   Cannabis use disorder, severe, dependence (HCC) 02/25/2016   Bipolar disorder (HCC) 06/21/2014   Anxiety 06/21/2014   Cigarette smoker 06/21/2014   HIV disease (HCC) 06/20/2014   Dyslipidemia 06/20/2014    Patient's Medications  New Prescriptions   No medications on file  Previous Medications   CABOTEGRAVIR & RILPIVIRINE ER (CABENUVA) 600 & 900 MG/3ML INJECTION    Inject 1 kit into the muscle every 30 (thirty) days.   CABOTEGRAVIR & RILPIVIRINE ER (CABENUVA) 600 & 900 MG/3ML INJECTION    Inject 1 kit into the muscle every 2 (two) months.   ESCITALOPRAM (LEXAPRO) 20 MG TABLET    Take 20 mg by mouth daily.   IBUPROFEN (ADVIL) 800 MG TABLET    Take 1 tablet (800 mg total) by mouth every 8 (eight) hours as needed (pain).   IMIQUIMOD (ALDARA) 5 % CREAM    Apply topically 3 (three) times a week.   OLANZAPINE (ZYPREXA) 20 MG TABLET    Take 20 mg by mouth at bedtime.   TRAZODONE (DESYREL) 50 MG TABLET    Take 50 mg by mouth at bedtime.  Modified Medications   No medications on file  Discontinued Medications   No medications on file    Allergies: Allergies  Allergen Reactions   Bee Venom Swelling and Other (See Comments)    Large local reactions   Lisinopril Swelling and Other (See Comments)    Facial and lip swelling    Labs: Lab Results  Component Value Date   HIV1RNAQUANT 21 (H) 03/01/2023   HIV1RNAQUANT Not Detected 10/27/2022   HIV1RNAQUANT Not  Detected 09/02/2022   CD4TABS 903 03/01/2023   CD4TABS 1,196 09/02/2022   CD4TABS 1,153 04/28/2022    RPR and STI Lab Results  Component Value Date   LABRPR REACTIVE (A) 03/01/2023   LABRPR REACTIVE (A) 12/21/2022   LABRPR REACTIVE (A) 10/27/2022   LABRPR REACTIVE (A) 04/28/2022   LABRPR REACTIVE (A) 04/29/2021   RPRTITER 1:64 (H) 03/01/2023   RPRTITER 1:64 (H) 12/21/2022   RPRTITER 1:512 (H) 10/27/2022   RPRTITER 1:8 (H) 04/28/2022   RPRTITER 1:8 (H) 04/29/2021    STI Results GC CT  03/01/2023  2:47 PM Negative    Negative    Negative  Negative    Negative    Negative   12/31/2022  3:50 PM Negative  Negative   12/21/2022  3:59 PM Positive    Positive    Negative  Negative    Negative    Negative   10/27/2022  2:23 PM Negative    Negative    Negative  Negative    Negative    Negative   06/03/2022 11:47 AM Negative    Negative    Negative  Negative    Negative    Negative   10/03/2019 12:00 AM Negative  Negative   12/01/2017 12:00 AM Negative  C **POSITIVE**  C  08/24/2014 12:00 AM NG: Negative  CT: Negative   06/07/2014 12:00 AM NG:  Negative  CT: Negative     C Corrected result   Multiple values from one day are sorted in reverse-chronological order    Hepatitis B Lab Results  Component Value Date   HEPBSAB POS (A) 06/07/2014   HEPBSAG NON REACTIVE 04/18/2021   HEPBCAB NON REACTIVE 06/07/2014   Hepatitis C No results found for: "HEPCAB", "HCVRNAPCRQN" Hepatitis A Lab Results  Component Value Date   HAV NON REACTIVE 06/07/2014   Lipids: Lab Results  Component Value Date   CHOL 319 (H) 04/18/2021   TRIG 96 04/18/2021   HDL 49 04/18/2021   CHOLHDL 6.5 04/18/2021   VLDL 19 04/18/2021   LDLCALC 251 (H) 04/18/2021    TARGET DATE: The 29th of the month  Assessment: Raith presents today for their maintenance Cabenuva injections. Past injections were tolerated well without issues. HIV RNA 21 at last visit. Discussed with Tokuo, and assured  that this is still undetectable. We will continue to monitor. Demetry was treated for primary syphilis in April, with peak RPR of 1:512 most recently 1:64. Ila reports no concerns for STIs today. Had concerns regarding rectal pain/bump/abscess at last visit, and was prescribed Augmentin. Reports that this has improved. The bump is still present, but not painful. He is happy to follow up with Judeth Cornfield in February regarding this. Landen politely declines STI testing today. Currently on q4-6 month lab draws per Rexene Alberts, NP.   Administered cabotegravir 600mg /26mL in left upper outer quadrant of the gluteal muscle. Administered rilpivirine 900 mg/65mL in the right upper outer quadrant of the gluteal muscle. No issues with injections. Kaceon will follow up in 2 months for next set of injections.  Plan: - Cabenuva injections administered - COVID/Flu shots given in office today - Next injections scheduled for 06/28/23 with [name], 09/06/23 with Rexene Alberts, NP - Call with any issues or questions  Lora Paula, PharmD PGY-2 Infectious Diseases Pharmacy Resident 04/27/2023 5:11 PM

## 2023-04-27 ENCOUNTER — Other Ambulatory Visit: Payer: Self-pay

## 2023-04-27 ENCOUNTER — Ambulatory Visit: Payer: Medicare (Managed Care) | Admitting: Pharmacist

## 2023-04-27 ENCOUNTER — Ambulatory Visit: Payer: Medicare Other | Admitting: Internal Medicine

## 2023-04-27 DIAGNOSIS — Z23 Encounter for immunization: Secondary | ICD-10-CM

## 2023-04-27 DIAGNOSIS — B20 Human immunodeficiency virus [HIV] disease: Secondary | ICD-10-CM

## 2023-04-28 DIAGNOSIS — B20 Human immunodeficiency virus [HIV] disease: Secondary | ICD-10-CM

## 2023-04-28 MED ORDER — CABOTEGRAVIR & RILPIVIRINE ER 600 & 900 MG/3ML IM SUER
1.0000 | Freq: Once | INTRAMUSCULAR | Status: AC
  Administered 2023-04-28: 1 via INTRAMUSCULAR

## 2023-05-13 ENCOUNTER — Other Ambulatory Visit: Payer: Self-pay

## 2023-05-19 ENCOUNTER — Other Ambulatory Visit: Payer: Self-pay

## 2023-06-14 ENCOUNTER — Other Ambulatory Visit (HOSPITAL_COMMUNITY): Payer: Self-pay

## 2023-06-15 ENCOUNTER — Other Ambulatory Visit (HOSPITAL_COMMUNITY): Payer: Self-pay

## 2023-06-17 ENCOUNTER — Other Ambulatory Visit: Payer: Self-pay

## 2023-06-17 ENCOUNTER — Other Ambulatory Visit: Payer: Self-pay | Admitting: Pharmacist

## 2023-06-17 ENCOUNTER — Other Ambulatory Visit (HOSPITAL_COMMUNITY): Payer: Self-pay

## 2023-06-17 DIAGNOSIS — B20 Human immunodeficiency virus [HIV] disease: Secondary | ICD-10-CM

## 2023-06-17 MED ORDER — CABOTEGRAVIR & RILPIVIRINE ER 600 & 900 MG/3ML IM SUER
1.0000 | INTRAMUSCULAR | 5 refills | Status: DC
  Filled 2023-06-17: qty 6, 60d supply, fill #0
  Filled 2023-08-06: qty 6, 60d supply, fill #1
  Filled 2023-10-13: qty 6, 60d supply, fill #2
  Filled 2023-12-23: qty 6, 60d supply, fill #3
  Filled 2024-02-17: qty 6, 60d supply, fill #4
  Filled 2024-04-19: qty 6, 60d supply, fill #5

## 2023-06-17 NOTE — Progress Notes (Signed)
Specialty Pharmacy Refill Coordination Note  Casin Scholtes is a 32 y.o. male assessed today regarding refills of clinic administered specialty medication(s) Cabotegravir & Rilpivirine [facility])   Clinic requested Courier to Provider Office   Delivery date: 06/23/23   Verified address: [address] [place] [address]   Medication will be filled on 06/22/23.

## 2023-06-18 ENCOUNTER — Other Ambulatory Visit: Payer: Self-pay

## 2023-06-22 ENCOUNTER — Other Ambulatory Visit: Payer: Self-pay

## 2023-06-23 ENCOUNTER — Telehealth: Payer: Self-pay

## 2023-06-23 NOTE — Telephone Encounter (Signed)
[facility] Patient Advocate Encounter  Patient's medications CABENUVA  have been couriered to [facility] from [facility] pharmacy and will be administered at the patients appointment on 06/28/23.  Kae Heller, CPhT Specialty Pharmacy Patient [facility] for Infectious Disease Phone: [phone] Fax:  [phone]

## 2023-06-25 NOTE — Progress Notes (Unsigned)
HPI: Jeffery [name] is a 32 y.o. male who presents to the [facility] pharmacy clinic for [place] administration.  Patient Active Problem List   Diagnosis Date Noted   Rectal abscess 03/03/2023   Hemorrhoids    Schizoaffective disorder, depressive type (HCC) 04/18/2021   Genital warts 05/21/2020   Syphilis 05/21/2020   Assessment of effects of psychotropic drug in patient at risk for metabolic syndrome 09/16/2018   Hypertension 10/05/2016   MDD (major depressive disorder), recurrent severe, without psychosis (HCC) 02/25/2016   Cannabis use disorder, severe, dependence (HCC) 02/25/2016   Bipolar disorder (HCC) 06/21/2014   Anxiety 06/21/2014   Cigarette smoker 06/21/2014   HIV disease (HCC) 06/20/2014   Dyslipidemia 06/20/2014    Patient's Medications  New Prescriptions   No medications on file  Previous Medications   CABOTEGRAVIR & RILPIVIRINE ER (CABENUVA) 600 & 900 MG/3ML INJECTION    Inject 1 kit into the muscle every 2 (two) months.   ESCITALOPRAM (LEXAPRO) 20 MG TABLET    Take 20 mg by mouth daily.   IBUPROFEN (ADVIL) 800 MG TABLET    Take 1 tablet (800 mg total) by mouth every 8 (eight) hours as needed (pain).   IMIQUIMOD (ALDARA) 5 % CREAM    Apply topically 3 (three) times a week.   OLANZAPINE (ZYPREXA) 20 MG TABLET    Take 20 mg by mouth at bedtime.   TRAZODONE (DESYREL) 50 MG TABLET    Take 50 mg by mouth at bedtime.  Modified Medications   No medications on file  Discontinued Medications   No medications on file    Allergies: Allergies  Allergen Reactions   Bee Venom Swelling and Other (See Comments)    Large local reactions   Lisinopril Swelling and Other (See Comments)    Facial and lip swelling    Labs: Lab Results  Component Value Date   HIV1RNAQUANT 21 (H) 03/01/2023   HIV1RNAQUANT Not Detected 10/27/2022   HIV1RNAQUANT Not Detected 09/02/2022   CD4TABS 903 03/01/2023   CD4TABS 1,196 09/02/2022   CD4TABS 1,153 04/28/2022    RPR and STI Lab  Results  Component Value Date   LABRPR REACTIVE (A) 03/01/2023   LABRPR REACTIVE (A) 12/21/2022   LABRPR REACTIVE (A) 10/27/2022   LABRPR REACTIVE (A) 04/28/2022   LABRPR REACTIVE (A) 04/29/2021   RPRTITER 1:64 (H) 03/01/2023   RPRTITER 1:64 (H) 12/21/2022   RPRTITER 1:512 (H) 10/27/2022   RPRTITER 1:8 (H) 04/28/2022   RPRTITER 1:8 (H) 04/29/2021    STI Results GC CT  03/01/2023  2:47 PM Negative    Negative    Negative  Negative    Negative    Negative   12/31/2022  3:50 PM Negative  Negative   12/21/2022  3:59 PM Positive    Positive    Negative  Negative    Negative    Negative   10/27/2022  2:23 PM Negative    Negative    Negative  Negative    Negative    Negative   06/03/2022 11:47 AM Negative    Negative    Negative  Negative    Negative    Negative   10/03/2019 12:00 AM Negative  Negative   12/01/2017 12:00 AM Negative  C **POSITIVE**  C  08/24/2014 12:00 AM NG: Negative  CT: Negative   06/07/2014 12:00 AM NG: Negative  CT: Negative     C Corrected result    Hepatitis B Lab Results  Component Value Date   HEPBSAB  POS (A) 06/07/2014   HEPBSAG NON REACTIVE 04/18/2021   HEPBCAB NON REACTIVE 06/07/2014   Hepatitis C No results found for: "HEPCAB", "HCVRNAPCRQN" Hepatitis A Lab Results  Component Value Date   HAV NON REACTIVE 06/07/2014   Lipids: Lab Results  Component Value Date   CHOL 319 (H) 04/18/2021   TRIG 96 04/18/2021   HDL 49 04/18/2021   CHOLHDL 6.5 04/18/2021   VLDL 19 04/18/2021   LDLCALC 251 (H) 04/18/2021    TARGET DATE: The 29th  Assessment: Jeffery [name] presents today for his maintenance Cabenuva injections. Past injections were tolerated well without issues. HIV RNA was undetectable (21) in August. Will defer checking today. Declines STI testing; will get this updated at his next appointment with [facility].   Administered cabotegravir 600mg /81mL in left upper outer quadrant of the gluteal muscle. Administered rilpivirine 900  mg/53mL in the right upper outer quadrant of the gluteal muscle. No issues with injections. He will follow up in 2 months for next set of injections.  Plan: - Cabenuva injections administered - Next injections scheduled for 09/06/23 with Judeth Cornfield and 11/01/23 with me - Call with any issues or questions  Hiedi Touchton L. Korbyn Vanes, PharmD, BCIDP, AAHIVP, CPP Clinical Pharmacist Practitioner Infectious Diseases Clinical Pharmacist [facility]

## 2023-06-28 ENCOUNTER — Ambulatory Visit: Payer: Medicare (Managed Care) | Admitting: Pharmacist

## 2023-06-28 ENCOUNTER — Other Ambulatory Visit: Payer: Self-pay

## 2023-06-28 DIAGNOSIS — B20 Human immunodeficiency virus [HIV] disease: Secondary | ICD-10-CM | POA: Diagnosis not present

## 2023-06-28 MED ORDER — CABOTEGRAVIR & RILPIVIRINE ER 600 & 900 MG/3ML IM SUER
1.0000 | Freq: Once | INTRAMUSCULAR | Status: AC
  Administered 2023-06-28: 1 via INTRAMUSCULAR

## 2023-08-03 ENCOUNTER — Other Ambulatory Visit (HOSPITAL_COMMUNITY): Payer: Self-pay

## 2023-08-04 ENCOUNTER — Other Ambulatory Visit: Payer: Self-pay

## 2023-08-05 ENCOUNTER — Other Ambulatory Visit: Payer: Self-pay

## 2023-08-06 ENCOUNTER — Other Ambulatory Visit (HOSPITAL_COMMUNITY): Payer: Self-pay

## 2023-08-06 ENCOUNTER — Other Ambulatory Visit: Payer: Self-pay

## 2023-08-06 NOTE — Progress Notes (Signed)
Specialty Pharmacy Refill Coordination Note  Jeffery [name] is a 33 y.o. male assessed today regarding refills of clinic administered specialty medication(s) Cabotegravir & Rilpivirine [facility])   Clinic requested Courier to Provider Office   Delivery date: 08/31/23   Verified address: [address] [place] [address]   Medication will be filled on 08/30/23.

## 2023-08-30 ENCOUNTER — Other Ambulatory Visit: Payer: Self-pay

## 2023-08-30 ENCOUNTER — Other Ambulatory Visit (HOSPITAL_COMMUNITY): Payer: Self-pay

## 2023-08-31 ENCOUNTER — Telehealth: Payer: Self-pay

## 2023-08-31 NOTE — Telephone Encounter (Signed)
 [facility] Patient Advocate Encounter  Patient's medications CABENUVA have been couriered to [facility] from [facility] pharmacy and will be administered at the patients appointment on 09/06/23.  Kae Heller, CPhT Specialty Pharmacy Patient [facility] for Infectious Disease Phone: [phone] Fax:  [phone]

## 2023-09-03 ENCOUNTER — Other Ambulatory Visit (HOSPITAL_COMMUNITY): Payer: Self-pay

## 2023-09-03 ENCOUNTER — Telehealth: Payer: Self-pay | Admitting: Pharmacist

## 2023-09-03 NOTE — Telephone Encounter (Signed)
 Received call today from patient's insurance stating Jeffery [name] is no longer on formulary and that he would need to transition to another option. They did not have the list of preferred alternatives. His next [facility] appointment is next Monday, 3/3, and we received a refill for him already this week. Hopefully, we can use that supply to still provide injection and resolve insurance issues before next injection at the end of April.   Margarite Gouge, PharmD, CPP, BCIDP, AAHIVP Clinical Pharmacist Practitioner Infectious Diseases Clinical Pharmacist [facility] for Infectious Disease

## 2023-09-06 ENCOUNTER — Encounter: Payer: Self-pay | Admitting: Infectious Diseases

## 2023-09-06 ENCOUNTER — Other Ambulatory Visit (HOSPITAL_COMMUNITY)
Admission: RE | Admit: 2023-09-06 | Discharge: 2023-09-06 | Disposition: A | Discharge status: Home / Self Care | Source: Ambulatory Visit | Attending: Infectious Diseases | Admitting: Infectious Diseases

## 2023-09-06 ENCOUNTER — Other Ambulatory Visit (HOSPITAL_COMMUNITY): Payer: Self-pay

## 2023-09-06 ENCOUNTER — Other Ambulatory Visit: Payer: Self-pay

## 2023-09-06 ENCOUNTER — Ambulatory Visit (INDEPENDENT_AMBULATORY_CARE_PROVIDER_SITE_OTHER): Payer: Medicare Other | Admitting: Infectious Diseases

## 2023-09-06 VITALS — BP 161/113 | HR 123 | Temp 98.6°F | Resp 16 | Wt 156.0 lb

## 2023-09-06 DIAGNOSIS — R067 Sneezing: Secondary | ICD-10-CM

## 2023-09-06 DIAGNOSIS — B20 Human immunodeficiency virus [HIV] disease: Secondary | ICD-10-CM

## 2023-09-06 DIAGNOSIS — I10 Essential (primary) hypertension: Secondary | ICD-10-CM | POA: Diagnosis not present

## 2023-09-06 DIAGNOSIS — Z113 Encounter for screening for infections with a predominantly sexual mode of transmission: Secondary | ICD-10-CM | POA: Insufficient documentation

## 2023-09-06 DIAGNOSIS — R0981 Nasal congestion: Secondary | ICD-10-CM

## 2023-09-06 DIAGNOSIS — B001 Herpesviral vesicular dermatitis: Secondary | ICD-10-CM | POA: Diagnosis not present

## 2023-09-06 MED ORDER — CABOTEGRAVIR & RILPIVIRINE ER 600 & 900 MG/3ML IM SUER
1.0000 | Freq: Once | INTRAMUSCULAR | Status: AC
  Administered 2023-09-06: 1 via INTRAMUSCULAR

## 2023-09-06 NOTE — Progress Notes (Signed)
 Name: Jeffery [name]  DOB: [birth year] MRN: [id number] PCP: Pcp, No    Brief Narrative:  Jeffery [name] is a 33 y.o. male with HIV disease, Dx October 2010 Holds a degree in social work. H/O anxiety and bipolar disorder (acute hospitalizations in 2014 & 2015).  CD4 nadir > 200 VL unknown HIV Risk: sexual / msm History of OIs: none  Intake Labs: Hep B sAg (- 2022), sAb (+ 2015), cAb (); Hep A (+ 2015), Hep C (- 2015) Quantiferon (-2015) HLA B*5701 (-) G6PD: ()   Previous Regimens: Intelence + Truvada 2010 Tivicay + Truvada 2014 (told that he developed resistance to Intelence) Triumeq + Prezcobix 2015 >> 2023 Cabenuva 05-2022   Genotypes: 2015 - high level resistance to lamivudine, emtricitabine, low level to abacavir (M184i, K219R)  Subjective:   Chief Complaint  Patient presents with   Follow-up    B20      Discussed the use of AI scribe software for clinical note transcription with the patient, who gave verbal consent to proceed.  History of Present Illness   Jeffery [name] is a 33 year old male who presents with sniffles and sneezing.  He has been experiencing sniffles and sneezing. He has a history of mild seasonal allergies, but describes the current episode as more severe than usual. No fever, chills, or sore throat currently, although he did have a sore throat one to two weeks ago.  Approximately two weeks ago, he experienced a cold sore on the inside of his lip, which was slightly painful. The cold sore has since resolved. No current symptoms suggestive of syphilis, such as burning during urination or penile discharge.  He has a history of previously managed hypertension but is not currently on antihypertensive medication. He mentions that his blood pressure sometimes fluctuates and can be 'extra high.' He does not regularly monitor his blood pressure at home, only during doctor visits. He has not been taking any cough or cold medications recently, except  for his prescription medication taken shortly before the visit.  He smokes cigarettes and consumes alcohol, both of which he acknowledges can increase blood pressure. He finds it challenging to quit smoking at this time.         09/06/2023    2:37 PM  Depression screen PHQ 2/9  Decreased Interest 0  Down, Depressed, Hopeless 1  PHQ - 2 Score 1    Review of Systems  Constitutional:  Negative for chills and fever.  HENT:  Negative for sore throat.   Eyes:  Negative for visual disturbance.  Gastrointestinal:  Positive for rectal pain (discharge that is bloody appearing noted coming from previous bump that popped). Negative for abdominal pain, anal bleeding, blood in stool and diarrhea.  Genitourinary:  Negative for dysuria, genital sores, penile discharge, penile pain, scrotal swelling and testicular pain.  Musculoskeletal:  Negative for arthralgias and joint swelling.  Skin:  Negative for rash.  Neurological:  Negative for headaches.  Hematological:  Negative for adenopathy.     Past Medical History:  Diagnosis Date   Anxiety    Chlamydia    Depression    Drug induced constipation    Hemorrhoids    HIV infection (HCC)    Hyperlipidemia    Hypertension    Pt went off meds on his own; has been monitored without any problems   Nicotine dependence    Syphilis 2013 and 2015    Outpatient Medications Prior to Visit  Medication Sig Dispense Refill   cabotegravir &  rilpivirine ER (CABENUVA) 600 & 900 MG/3ML injection Inject 1 kit into the muscle every 2 (two) months. 6 mL 5   escitalopram (LEXAPRO) 20 MG tablet Take 20 mg by mouth daily.     OLANZapine (ZYPREXA) 20 MG tablet Take 20 mg by mouth at bedtime.     traZODone (DESYREL) 50 MG tablet Take 50 mg by mouth at bedtime.     ibuprofen (ADVIL) 800 MG tablet Take 1 tablet (800 mg total) by mouth every 8 (eight) hours as needed (pain). (Patient not taking: Reported on 09/06/2023) 21 tablet 0   imiquimod (ALDARA) 5 % cream Apply  topically 3 (three) times a week. (Patient not taking: Reported on 09/06/2023) 12 each 2   No facility-administered medications prior to visit.     Allergies  Allergen Reactions   Bee Venom Swelling and Other (See Comments)    Large local reactions   Lisinopril Swelling and Other (See Comments)    Facial and lip swelling    Social History   Tobacco Use   Smoking status: Light Smoker    Current packs/day: 0.25    Types: Cigarettes   Smokeless tobacco: Never   Tobacco comments:    7-8 cigarettes a day  Vaping Use   Vaping status: Never Used  Substance Use Topics   Alcohol use: Yes    Alcohol/week: 0.0 standard drinks of alcohol    Comment: occasional   Drug use: Not Currently    Frequency: 3.0 times per week    Types: Marijuana    Family History  Problem Relation Age of Onset   Hypertension Father    Hypertension Maternal Grandmother     Social History   Substance and Sexual Activity  Sexual Activity Not Currently   Partners: Male   Comment: given condoms     Objective:   Vitals:   09/06/23 1436 09/06/23 1437  BP:  (!) 161/113  Pulse:  (!) 123  Resp:  16  Temp:  98.6 F (37 C)  TempSrc:  Oral  SpO2:  98%  Weight: 156 lb (70.8 kg) 156 lb (70.8 kg)   Body mass index is 24.43 kg/m.  Physical Exam Constitutional:      Appearance: Normal appearance. He is not ill-appearing.  HENT:     Head: Normocephalic.     Nose: Congestion present.     Mouth/Throat:     Mouth: Mucous membranes are moist.     Pharynx: Oropharynx is clear. Posterior oropharyngeal erythema present. No oropharyngeal exudate.  Eyes:     General: Lids are normal. No scleral icterus.    Conjunctiva/sclera:     Right eye: Chemosis present. No exudate.    Left eye: Chemosis present. No exudate.    Pupils: Pupils are equal, round, and reactive to light.  Pulmonary:     Effort: Pulmonary effort is normal.  Musculoskeletal:        General: Normal range of motion.     Cervical back:  Normal range of motion.  Skin:    Coloration: Skin is not jaundiced or pale.  Neurological:     Mental Status: He is alert and oriented to person, place, and time.  Psychiatric:        Mood and Affect: Mood normal.        Judgment: Judgment normal.     Lab Results Lab Results  Component Value Date   WBC 11.8 (H) 04/28/2022   HGB 15.1 04/28/2022   HCT 44.5 04/28/2022   MCV 97.4  04/28/2022   PLT 335 04/28/2022    Lab Results  Component Value Date   CREATININE 1.04 04/28/2022   BUN 8 04/28/2022   NA 138 04/28/2022   K 4.1 04/28/2022   CL 105 04/28/2022   CO2 26 04/28/2022    Lab Results  Component Value Date   ALT 10 04/28/2022   AST 14 04/28/2022   ALKPHOS 85 04/18/2021   BILITOT 0.4 04/28/2022    Lab Results  Component Value Date   CHOL 319 (H) 04/18/2021   HDL 49 04/18/2021   LDLCALC 251 (H) 04/18/2021   TRIG 96 04/18/2021   CHOLHDL 6.5 04/18/2021   HIV 1 RNA Quant (Copies/mL)  Date Value  03/01/2023 21 (H)  10/27/2022 Not Detected  09/02/2022 Not Detected   CD4 T Cell Abs (/uL)  Date Value  03/01/2023 903  09/02/2022 1,196  04/28/2022 1,153     Assessment & Plan:     HIV -  Doing well on cabenuva injection Q35m on 29th. Continue 2x annual monitoring.  Needs updated anal pap smear in 2026 and Quantiferon screen at next visit with me in August.  No dental needs. No mood concerns.  -Asymptomatic STI screening collected today  -HIV VL / CD4 and CMP / CBC today for monitoring.  -FU with pharmacy team for injections, next on 11/01/2023  Sneezing / Nasal Congestion -  Symptoms likely due to either viral URI vs seasonal allergies. Throat is a little red but no swelling. No purulence or lesions noted on tongue, cheeks or palate.  - Recommend over-the-counter antihistamines. - Avoid decongestants outside of Coricidin d/t elevated blood pressure - Flonase would probably be helpful as well daily   Cold Sore - Recent cold sore likely viral. Symptoms  resolved. No current signs of syphilis.  - Check syphilis titer in blood work - previously serofast at 1:64   Hypertension - Intermittent hypertension noted. Blood pressure may be influenced by decongestants, smoking, and alcohol. Discussed long-term risks of untreated hypertension. Advised on monitoring and considering medication if persistent. - Monitor blood pressure at home using an arm cuff . - Recheck blood pressure at next pharmacy visit for Cabenuva injection. - Consider resuming antihypertensive medication if hypertension persists. - Encourage reduction in smoking and alcohol consumption.  Follow-up Next appointment scheduled for April 28th. - Perform blood work and swab work. - Confirm next appointment on April 28th.      No orders of the defined types were placed in this encounter.  Orders Placed This Encounter  Procedures   HIV 1 RNA quant-no reflex-bld   RPR   T-helper cells (CD4) count   COMPLETE METABOLIC PANEL WITH GFR   CBC   11/01/2023 next cabenuva injection with pharmacy team.    Rexene Alberts, MSN, NP-C [facility] Pager: [phone] Office: [phone]  09/06/23  3:11 PM

## 2023-09-06 NOTE — Patient Instructions (Addendum)
 I suspect you have either a head cold or seasonal allergies starting.   For decongestant - try CORICIDIN HBP - this is over the counter.  Also adding a once daily allergy medication may help (zyrtec, xyzal)   If you notice any fevers or feeling bad you may need to go to urgent care for viral testing to ensure not covid.    If your blood pressure remains this high I think it would be best to restart your blood pressure medication.   11/01/2023 is your next appointment

## 2023-09-06 NOTE — Addendum Note (Signed)
 Addended by: Marcell Anger on: 09/06/2023 03:29 PM   Modules accepted: Orders

## 2023-09-07 ENCOUNTER — Other Ambulatory Visit (HOSPITAL_COMMUNITY): Payer: Self-pay

## 2023-09-07 ENCOUNTER — Other Ambulatory Visit: Payer: Self-pay

## 2023-09-07 LAB — T-HELPER CELLS (CD4) COUNT (NOT AT ARMC)
CD4 % Helper T Cell: 31 % — ABNORMAL LOW (ref 33–65)
CD4 T Cell Abs: 808 /uL (ref 400–1790)

## 2023-09-07 LAB — CYTOLOGY, (ORAL, ANAL, URETHRAL) ANCILLARY ONLY
Chlamydia: NEGATIVE
Chlamydia: NEGATIVE
Comment: NEGATIVE
Comment: NEGATIVE
Comment: NORMAL
Comment: NORMAL
Neisseria Gonorrhea: NEGATIVE
Neisseria Gonorrhea: NEGATIVE

## 2023-09-07 MED ORDER — AMOXICILLIN-POT CLAVULANATE 875-125 MG PO TABS
1.0000 | ORAL_TABLET | Freq: Two times a day (BID) | ORAL | 0 refills | Status: AC
Start: 2023-09-07 — End: ?

## 2023-09-07 NOTE — Addendum Note (Signed)
 Addended by: Blanchard Kelch on: 09/07/2023 08:54 AM   Modules accepted: Orders

## 2023-09-07 NOTE — Telephone Encounter (Signed)
 Would Cabenuva be covered through his medical benefits?

## 2023-09-07 NOTE — Progress Notes (Signed)
 Yes, no problem. Made myself a note to recheck in April.

## 2023-09-08 ENCOUNTER — Telehealth: Payer: Self-pay

## 2023-09-08 LAB — COMPLETE METABOLIC PANEL WITH GFR
AG Ratio: 1.2 (calc) (ref 1.0–2.5)
ALT: 9 U/L (ref 9–46)
AST: 15 U/L (ref 10–40)
Albumin: 4.5 g/dL (ref 3.6–5.1)
Alkaline phosphatase (APISO): 108 U/L (ref 36–130)
BUN/Creatinine Ratio: 7 (calc) (ref 6–22)
BUN: 6 mg/dL — ABNORMAL LOW (ref 7–25)
CO2: 25 mmol/L (ref 20–32)
Calcium: 9.6 mg/dL (ref 8.6–10.3)
Chloride: 103 mmol/L (ref 98–110)
Creat: 0.81 mg/dL (ref 0.60–1.26)
Globulin: 3.9 g/dL — ABNORMAL HIGH (ref 1.9–3.7)
Glucose, Bld: 54 mg/dL — ABNORMAL LOW (ref 65–99)
Potassium: 3.4 mmol/L — ABNORMAL LOW (ref 3.5–5.3)
Sodium: 138 mmol/L (ref 135–146)
Total Bilirubin: 0.4 mg/dL (ref 0.2–1.2)
Total Protein: 8.4 g/dL — ABNORMAL HIGH (ref 6.1–8.1)
eGFR: 119 mL/min/{1.73_m2} (ref >=60)

## 2023-09-08 LAB — CBC
HCT: 48 % (ref 38.5–50.0)
Hemoglobin: 16.2 g/dL (ref 13.2–17.1)
MCH: 30.1 pg (ref 27.0–33.0)
MCHC: 33.8 g/dL (ref 32.0–36.0)
MCV: 89.2 fL (ref 80.0–100.0)
MPV: 10.1 fL (ref 7.5–12.5)
Platelets: 398 10*3/uL (ref 140–400)
RBC: 5.38 10*6/uL (ref 4.20–5.80)
RDW: 13.2 % (ref 11.0–15.0)
WBC: 18 10*3/uL — ABNORMAL HIGH (ref 3.8–10.8)

## 2023-09-08 LAB — T PALLIDUM AB: T Pallidum Abs: POSITIVE — AB

## 2023-09-08 LAB — RPR TITER: RPR Titer: 1:32 {titer} — ABNORMAL HIGH

## 2023-09-08 LAB — HIV-1 RNA QUANT-NO REFLEX-BLD
HIV 1 RNA Quant: NOT DETECTED {copies}/mL
HIV-1 RNA Quant, Log: NOT DETECTED {Log_copies}/mL

## 2023-09-08 LAB — RPR: RPR Ser Ql: REACTIVE — AB

## 2023-09-08 NOTE — Progress Notes (Signed)
 Patient informed of labs and advised that Augmentin has been sent to the pharmacy.  Patient also advised he can do sinus rinses to help clear his nose. Maddyson Keil Jonathon Resides, CMA

## 2023-09-08 NOTE — Telephone Encounter (Signed)
 Patient advised of lab results and verbalized understanding.  Aware to pick up Augmentin and try sinus rinses to help clear his nose up.  Kaydra Borgen Jonathon Resides, CMA

## 2023-09-08 NOTE — Telephone Encounter (Signed)
-----   Message from [place] sent at 09/07/2023  8:53 AM EST ----- WBC is up a fair amount which seems to argue he has bacterial sinus infection - lets get him started on Augmentin for a week to see if that helps - take one tablet twice a day with food.  Sinus rinses may help (netti pot with distilled water, saline spray) to keep the nose clear.

## 2023-10-13 ENCOUNTER — Other Ambulatory Visit (HOSPITAL_COMMUNITY): Payer: Self-pay

## 2023-10-13 ENCOUNTER — Other Ambulatory Visit: Payer: Self-pay

## 2023-10-13 NOTE — Progress Notes (Signed)
 Specialty Pharmacy Refill Coordination Note  Jeffery [name] is a 33 y.o. male assessed today regarding refills of clinic administered specialty medication(s) Cabotegravir & Rilpivirine [facility])   Clinic requested Courier to Provider Office   Delivery date: 10/28/23   Verified address: [address] [place] [address]   Medication will be filled on 10/27/23.

## 2023-10-14 ENCOUNTER — Other Ambulatory Visit (HOSPITAL_COMMUNITY): Payer: Self-pay

## 2023-10-27 ENCOUNTER — Telehealth: Payer: Self-pay

## 2023-10-27 ENCOUNTER — Other Ambulatory Visit (HOSPITAL_COMMUNITY): Payer: Self-pay

## 2023-10-27 ENCOUNTER — Other Ambulatory Visit: Payer: Self-pay

## 2023-10-27 NOTE — Telephone Encounter (Signed)
 Submitted a Prior Authorization request to [employer] for Cabenuva  via CoverMyMeds. Will update once we receive a response.  Pharmacy Benefits  PA ID: [id number]

## 2023-10-27 NOTE — Telephone Encounter (Signed)
 Pharmacy Patient Advocate Encounter- Cabenuva  BIV-Pharmacy Benefit:  PA was submitted to [facility] and has been approved through: 10/27/23-07/05/2098 Authorization#  [id number]  Please send prescription to Specialty Pharmacy: [facility]: [phone]  Estimated Copay is: 0.00

## 2023-10-29 ENCOUNTER — Telehealth: Payer: Self-pay

## 2023-10-29 NOTE — Telephone Encounter (Signed)
 [facility] Patient Advocate Encounter  Patient's medications CABENUVA  have been couriered to [facility] from [facility] and will be administered at the patients appointment on 11/01/23.  Verline Glow, CPhT Specialty Pharmacy Patient [facility] Phone: [phone] Fax:  [phone]

## 2023-10-31 NOTE — Progress Notes (Unsigned)
 HPI: Jeffery [name] is a 33 y.o. male who presents to the [facility] pharmacy clinic for Cabenuva  administration.  Patient Active Problem List   Diagnosis Date Noted   Rectal abscess 03/03/2023   Hemorrhoids    Schizoaffective disorder, depressive type (HCC) 04/18/2021   Genital warts 05/21/2020   Syphilis 05/21/2020   Assessment of effects of psychotropic drug in patient at risk for metabolic syndrome 09/16/2018   Hypertension 10/05/2016   MDD (major depressive disorder), recurrent severe, without psychosis (HCC) 02/25/2016   Cannabis use disorder, severe, dependence (HCC) 02/25/2016   Bipolar disorder (HCC) 06/21/2014   Anxiety 06/21/2014   Cigarette smoker 06/21/2014   HIV disease (HCC) 06/20/2014   Dyslipidemia 06/20/2014    Patient's Medications  New Prescriptions   No medications on file  Previous Medications   AMOXICILLIN -CLAVULANATE (AUGMENTIN ) 875-125 MG TABLET    Take 1 tablet by mouth 2 (two) times daily.   CABOTEGRAVIR  & RILPIVIRINE  ER (CABENUVA ) 600 & 900 MG/3ML INJECTION    Inject 1 kit into the muscle every 2 (two) months.   ESCITALOPRAM (LEXAPRO) 20 MG TABLET    Take 20 mg by mouth daily.   IBUPROFEN  (ADVIL ) 800 MG TABLET    Take 1 tablet (800 mg total) by mouth every 8 (eight) hours as needed (pain).   IMIQUIMOD  (ALDARA ) 5 % CREAM    Apply topically 3 (three) times a week.   OLANZAPINE  (ZYPREXA ) 20 MG TABLET    Take 20 mg by mouth at bedtime.   TRAZODONE  (DESYREL ) 50 MG TABLET    Take 50 mg by mouth at bedtime.  Modified Medications   No medications on file  Discontinued Medications   No medications on file    Allergies: Allergies  Allergen Reactions   Bee Venom Swelling and Other (See Comments)    Large local reactions   Lisinopril  Swelling and Other (See Comments)    Facial and lip swelling    Labs: Lab Results  Component Value Date   HIV1RNAQUANT Not Detected 09/06/2023   HIV1RNAQUANT 21 (H) 03/01/2023   HIV1RNAQUANT Not Detected 10/27/2022    CD4TABS 808 09/06/2023   CD4TABS 903 03/01/2023   CD4TABS 1,196 09/02/2022    RPR and STI Lab Results  Component Value Date   LABRPR REACTIVE (A) 09/06/2023   LABRPR REACTIVE (A) 03/01/2023   LABRPR REACTIVE (A) 12/21/2022   LABRPR REACTIVE (A) 10/27/2022   LABRPR REACTIVE (A) 04/28/2022   RPRTITER 1:32 (H) 09/06/2023   RPRTITER 1:64 (H) 03/01/2023   RPRTITER 1:64 (H) 12/21/2022   RPRTITER 1:512 (H) 10/27/2022   RPRTITER 1:8 (H) 04/28/2022    STI Results GC CT  09/06/2023  2:45 PM Negative    Negative  Negative    Negative   03/01/2023  2:47 PM Negative    Negative    Negative  Negative    Negative    Negative   12/31/2022  3:50 PM Negative  Negative   12/21/2022  3:59 PM Positive    Positive    Negative  Negative    Negative    Negative   10/27/2022  2:23 PM Negative    Negative    Negative  Negative    Negative    Negative   06/03/2022 11:47 AM Negative    Negative    Negative  Negative    Negative    Negative   10/03/2019 12:00 AM Negative  Negative   12/01/2017 12:00 AM Negative  C **POSITIVE**  C  08/24/2014 12:00 AM  NG: Negative  CT: Negative   06/07/2014 12:00 AM NG: Negative  CT: Negative     C Corrected result    Hepatitis B Lab Results  Component Value Date   HEPBSAB POS (A) 06/07/2014   HEPBSAG NON REACTIVE 04/18/2021   HEPBCAB NON REACTIVE 06/07/2014   Hepatitis C No results found for: "HEPCAB", "HCVRNAPCRQN" Hepatitis A Lab Results  Component Value Date   HAV NON REACTIVE 06/07/2014   Lipids: Lab Results  Component Value Date   CHOL 319 (H) 04/18/2021   TRIG 96 04/18/2021   HDL 49 04/18/2021   CHOLHDL 6.5 04/18/2021   VLDL 19 04/18/2021   LDLCALC 251 (H) 04/18/2021    TARGET DATE: 29th  Assessment: Levine presents today for 2 moth maintenance Cabenuva  injections. Past injections were tolerated well without issues. Last HIV RNA was undetectable in 09/2023. Doing well with no issues today.  Recently was seen last month for  a bacterial sinus infection and was placed on Augmentin  x 7 days. ***. [name] was previously treated for secondary syphilis in April 2024 where his titer was 1:512. Titer in 09/2023 is now down to 1:32 and is dropping appropriately.   Administered cabotegravir  600mg /58mL in left upper outer quadrant of the gluteal muscle. Administered rilpivirine  900 mg/3mL in the right upper outer quadrant of the gluteal muscle. No issues with injections. *** will follow up in 2 months for next set of injections.  Immunizations: he is eligible for the shingles vaccination. Last HAV antibody was negative, been vaccinated since so will update today  Plan: - Cabenuva  injections administered - Next injections scheduled for *** - Call with any issues or questions  [name] L. Kuppelweiser, PharmD, BCIDP, AAHIVP, CPP Clinical Pharmacist Practitioner - Infectious Diseases Clinical Pharmacist Lead - Specialty Pharmacy [facility] 10/31/2023, 3:07 PM

## 2023-11-01 ENCOUNTER — Ambulatory Visit (INDEPENDENT_AMBULATORY_CARE_PROVIDER_SITE_OTHER): Payer: Medicare (Managed Care) | Admitting: Pharmacist

## 2023-11-01 ENCOUNTER — Other Ambulatory Visit: Payer: Self-pay

## 2023-11-01 DIAGNOSIS — B20 Human immunodeficiency virus [HIV] disease: Secondary | ICD-10-CM | POA: Diagnosis present

## 2023-11-01 DIAGNOSIS — Z79899 Other long term (current) drug therapy: Secondary | ICD-10-CM

## 2023-11-01 DIAGNOSIS — Z23 Encounter for immunization: Secondary | ICD-10-CM

## 2023-11-01 MED ORDER — CABOTEGRAVIR & RILPIVIRINE ER 600 & 900 MG/3ML IM SUER
1.0000 | Freq: Once | INTRAMUSCULAR | Status: AC
  Administered 2023-11-01: 1 via INTRAMUSCULAR

## 2023-11-02 LAB — LIPID PANEL
Cholesterol: 244 mg/dL — ABNORMAL HIGH (ref <200)
HDL: 41 mg/dL (ref >=40)
LDL Cholesterol (Calc): 185 mg/dL — ABNORMAL HIGH
Non-HDL Cholesterol (Calc): 203 mg/dL — ABNORMAL HIGH (ref <130)
Total CHOL/HDL Ratio: 6 (calc) — ABNORMAL HIGH (ref <5.0)
Triglycerides: 79 mg/dL (ref <150)

## 2023-11-02 LAB — CBC WITH DIFFERENTIAL/PLATELET
Absolute Lymphocytes: 3567 {cells}/uL (ref 850–3900)
Absolute Monocytes: 1021 {cells}/uL — ABNORMAL HIGH (ref 200–950)
Basophils Absolute: 74 {cells}/uL (ref 0–200)
Basophils Relative: 0.6 %
Eosinophils Absolute: 726 {cells}/uL — ABNORMAL HIGH (ref 15–500)
Eosinophils Relative: 5.9 %
HCT: 46.7 % (ref 38.5–50.0)
Hemoglobin: 15.8 g/dL (ref 13.2–17.1)
MCH: 30.4 pg (ref 27.0–33.0)
MCHC: 33.8 g/dL (ref 32.0–36.0)
MCV: 90 fL (ref 80.0–100.0)
MPV: 10.3 fL (ref 7.5–12.5)
Monocytes Relative: 8.3 %
Neutro Abs: 6913 {cells}/uL (ref 1500–7800)
Neutrophils Relative %: 56.2 %
Platelets: 412 10*3/uL — ABNORMAL HIGH (ref 140–400)
RBC: 5.19 10*6/uL (ref 4.20–5.80)
RDW: 13.9 % (ref 11.0–15.0)
Total Lymphocyte: 29 %
WBC: 12.3 10*3/uL — ABNORMAL HIGH (ref 3.8–10.8)

## 2023-11-02 LAB — HEPATITIS A ANTIBODY, TOTAL: Hepatitis A AB,Total: REACTIVE — AB

## 2023-11-02 NOTE — Progress Notes (Signed)
 Rechecked his CBC w diff.. WBC still mildly elevated. FYI!

## 2023-12-23 ENCOUNTER — Other Ambulatory Visit (HOSPITAL_COMMUNITY): Payer: Self-pay

## 2023-12-23 ENCOUNTER — Other Ambulatory Visit: Payer: Self-pay

## 2023-12-23 NOTE — Progress Notes (Signed)
 Specialty Pharmacy Refill Coordination Note  Jeffery [name] is a 33 y.o. male assessed today regarding refills of clinic administered specialty medication(s) Cabotegravir  & Rilpivirine  (CABENUVA )   Clinic requested Courier to Provider Office   Delivery date: 12/29/23   Verified address: [address] [place] [address]   Medication will be filled on 12/28/23.

## 2023-12-24 NOTE — Progress Notes (Signed)
 HPI: Jeffery [name] is a 33 y.o. male who presents to the [facility] pharmacy clinic for Cabenuva  administration.  Patient Active Problem List   Diagnosis Date Noted   Rectal abscess 03/03/2023   Hemorrhoids    Schizoaffective disorder, depressive type (HCC) 04/18/2021   Genital warts 05/21/2020   Syphilis 05/21/2020   Assessment of effects of psychotropic drug in patient at risk for metabolic syndrome 09/16/2018   Hypertension 10/05/2016   MDD (major depressive disorder), recurrent severe, without psychosis (HCC) 02/25/2016   Cannabis use disorder, severe, dependence (HCC) 02/25/2016   Bipolar disorder (HCC) 06/21/2014   Anxiety 06/21/2014   Cigarette smoker 06/21/2014   HIV disease (HCC) 06/20/2014   Dyslipidemia 06/20/2014    Patient's Medications  New Prescriptions   No medications on file  Previous Medications   AMOXICILLIN -CLAVULANATE (AUGMENTIN ) 875-125 MG TABLET    Take 1 tablet by mouth 2 (two) times daily.   CABOTEGRAVIR  & RILPIVIRINE  ER (CABENUVA ) 600 & 900 MG/3ML INJECTION    Inject 1 kit into the muscle every 2 (two) months.   ESCITALOPRAM (LEXAPRO) 20 MG TABLET    Take 20 mg by mouth daily.   IBUPROFEN  (ADVIL ) 800 MG TABLET    Take 1 tablet (800 mg total) by mouth every 8 (eight) hours as needed (pain).   IMIQUIMOD  (ALDARA ) 5 % CREAM    Apply topically 3 (three) times a week.   OLANZAPINE  (ZYPREXA ) 20 MG TABLET    Take 20 mg by mouth at bedtime.   TRAZODONE  (DESYREL ) 50 MG TABLET    Take 50 mg by mouth at bedtime.  Modified Medications   No medications on file  Discontinued Medications   No medications on file    Allergies: Allergies  Allergen Reactions   Bee Venom Swelling and Other (See Comments)    Large local reactions   Lisinopril  Swelling and Other (See Comments)    Facial and lip swelling    Labs: Lab Results  Component Value Date   HIV1RNAQUANT Not Detected 09/06/2023   HIV1RNAQUANT 21 (H) 03/01/2023   HIV1RNAQUANT Not Detected 10/27/2022    CD4TABS 808 09/06/2023   CD4TABS 903 03/01/2023   CD4TABS 1,196 09/02/2022    RPR and STI Lab Results  Component Value Date   LABRPR REACTIVE (A) 09/06/2023   LABRPR REACTIVE (A) 03/01/2023   LABRPR REACTIVE (A) 12/21/2022   LABRPR REACTIVE (A) 10/27/2022   LABRPR REACTIVE (A) 04/28/2022   RPRTITER 1:32 (H) 09/06/2023   RPRTITER 1:64 (H) 03/01/2023   RPRTITER 1:64 (H) 12/21/2022   RPRTITER 1:512 (H) 10/27/2022   RPRTITER 1:8 (H) 04/28/2022    STI Results GC CT  09/06/2023  2:45 PM Negative    Negative  Negative    Negative   03/01/2023  2:47 PM Negative    Negative    Negative  Negative    Negative    Negative   12/31/2022  3:50 PM Negative  Negative   12/21/2022  3:59 PM Positive    Positive    Negative  Negative    Negative    Negative   10/27/2022  2:23 PM Negative    Negative    Negative  Negative    Negative    Negative   06/03/2022 11:47 AM Negative    Negative    Negative  Negative    Negative    Negative   10/03/2019 12:00 AM Negative  Negative   12/01/2017 12:00 AM Negative  C **POSITIVE**  C  08/24/2014 12:00 AM  NG: Negative  CT: Negative   06/07/2014 12:00 AM NG: Negative  CT: Negative     C Corrected result    Hepatitis B Lab Results  Component Value Date   HEPBSAB POS (A) 06/07/2014   HEPBSAG NON REACTIVE 04/18/2021   HEPBCAB NON REACTIVE 06/07/2014   Hepatitis C No results found for: HEPCAB, HCVRNAPCRQN Hepatitis A Lab Results  Component Value Date   HAV REACTIVE (A) 11/01/2023   Lipids: Lab Results  Component Value Date   CHOL 244 (H) 11/01/2023   TRIG 79 11/01/2023   HDL 41 11/01/2023   CHOLHDL 6.0 (H) 11/01/2023   VLDL 19 04/18/2021   LDLCALC 185 (H) 11/01/2023    TARGET DATE: The 29th  Assessment: Jeffery [name] presents today for his maintenance Cabenuva  injections. Past injections were tolerated well without issues. Last HIV RNA was undetectable in March. Doing well with no issues today. Due for his 2nd Shingrix   vaccination today. Politely declines any STI testing today as well.    Administered cabotegravir  600mg /75mL in left upper outer quadrant of the gluteal muscle. Administered rilpivirine  900 mg/3mL in the right upper outer quadrant of the gluteal muscle. No issues with injections. He will follow up in 2 months for next set of injections.  Plan: - Cabenuva  injections administered - Final Shingrix  vaccine today - Next injections scheduled for 02/28/24 with Jeffery [name] and 05/01/24 with me - Call with any issues or questions  Jeffery [name] L. Jeffery [name], PharmD, BCIDP, AAHIVP, CPP Clinical Pharmacist Practitioner - Infectious Diseases Clinical Pharmacist Lead - Specialty Pharmacy [facility] 12/24/2023, 12:42 PM

## 2023-12-29 ENCOUNTER — Telehealth: Payer: Self-pay

## 2023-12-29 NOTE — Telephone Encounter (Signed)
 error

## 2023-12-29 NOTE — Telephone Encounter (Signed)
 [facility] Patient Advocate Encounter  Patient's medications CABENUVA  have been couriered to [facility] from [facility] and will be administered at the patients appointment on 01/03/24.  Charmaine Sharps, CPhT Specialty Pharmacy Patient [facility] Phone: [phone] Fax:  [phone]

## 2024-01-03 ENCOUNTER — Other Ambulatory Visit: Payer: Self-pay

## 2024-01-03 ENCOUNTER — Ambulatory Visit: Admitting: Pharmacist

## 2024-01-03 DIAGNOSIS — B20 Human immunodeficiency virus [HIV] disease: Secondary | ICD-10-CM

## 2024-01-03 DIAGNOSIS — Z113 Encounter for screening for infections with a predominantly sexual mode of transmission: Secondary | ICD-10-CM

## 2024-01-03 DIAGNOSIS — Z23 Encounter for immunization: Secondary | ICD-10-CM

## 2024-01-03 MED ORDER — CABOTEGRAVIR & RILPIVIRINE ER 600 & 900 MG/3ML IM SUER
1.0000 | Freq: Once | INTRAMUSCULAR | Status: AC
Start: 2024-01-03 — End: 2024-01-03
  Administered 2024-01-03: 1 via INTRAMUSCULAR

## 2024-02-17 ENCOUNTER — Other Ambulatory Visit (HOSPITAL_COMMUNITY): Payer: Self-pay

## 2024-02-17 ENCOUNTER — Other Ambulatory Visit: Payer: Self-pay

## 2024-02-17 NOTE — Progress Notes (Signed)
 Specialty Pharmacy Refill Coordination Note  Jeffery [name] is a 32 y.o. male assessed today regarding refills of clinic administered specialty medication(s) Cabotegravir  & Rilpivirine  (CABENUVA )   Clinic requested Courier to Provider Office   Delivery date: 02/24/24   Verified address: [address] [place] [address]   Medication will be filled on 02/23/24.

## 2024-02-24 ENCOUNTER — Telehealth: Payer: Self-pay

## 2024-02-24 NOTE — Telephone Encounter (Signed)
 [facility] Patient Advocate Encounter  Patient's medications CABENUVA  have been couriered to [facility] from [facility] and will be administered at the patients appointment on 02/28/24.  Charmaine Sharps, CPhT Specialty Pharmacy Patient [facility] Phone: [phone] Fax:  [phone]

## 2024-02-28 ENCOUNTER — Encounter: Payer: Self-pay | Admitting: Infectious Diseases

## 2024-02-28 ENCOUNTER — Other Ambulatory Visit: Payer: Self-pay

## 2024-02-28 ENCOUNTER — Ambulatory Visit: Admitting: Infectious Diseases

## 2024-02-28 VITALS — BP 133/95 | HR 90 | Temp 97.9°F | Ht 66.0 in | Wt 154.0 lb

## 2024-02-28 DIAGNOSIS — A519 Early syphilis, unspecified: Secondary | ICD-10-CM

## 2024-02-28 DIAGNOSIS — B20 Human immunodeficiency virus [HIV] disease: Secondary | ICD-10-CM

## 2024-02-28 MED ORDER — CABOTEGRAVIR & RILPIVIRINE ER 600 & 900 MG/3ML IM SUER
1.0000 | Freq: Once | INTRAMUSCULAR | Status: AC
  Administered 2024-02-28: 1 via INTRAMUSCULAR

## 2024-02-28 NOTE — Progress Notes (Unsigned)
 Name: Jeffery [name]  DOB: [birth year] MRN: [id number] PCP: Pcp, No    Brief Narrative:  Kong Packett is a 33 y.o. male with HIV disease, Dx October 2010 Holds a degree in social work. H/O anxiety and bipolar disorder (acute hospitalizations in 2014 & 2015).  CD4 nadir > 200 VL unknown HIV Risk: sexual / msm History of OIs: none  Intake Labs: Hep B sAg (- 2022), sAb (+ 2015), cAb (); Hep A (+ 2015), Hep C (- 2015) Quantiferon (-2015) HLA B*5701 (-) G6PD: ()   Previous Regimens: Intelence + Truvada 2010 Tivicay  + Truvada 2014 (told that he developed resistance to Intelence) Triumeq  + Prezcobix  2015 >> 2023 Cabenuva  05-2022   Genotypes: 2015 - high level resistance to lamivudine , emtricitabine , low level to abacavir  (M184i, K219R)  Subjective:   No chief complaint on file.    Discussed the use of AI scribe software for clinical note transcription with the patient, who gave verbal consent to proceed.  History of Present Illness             09/06/2023    2:37 PM  Depression screen PHQ 2/9  Decreased Interest 0  Down, Depressed, Hopeless 1  PHQ - 2 Score 1    Review of Systems  Constitutional:  Negative for chills and fever.  HENT:  Negative for sore throat.   Eyes:  Negative for visual disturbance.  Gastrointestinal:  Positive for rectal pain (discharge that is bloody appearing noted coming from previous bump that popped). Negative for abdominal pain, anal bleeding, blood in stool and diarrhea.  Genitourinary:  Negative for dysuria, genital sores, penile discharge, penile pain, scrotal swelling and testicular pain.  Musculoskeletal:  Negative for arthralgias and joint swelling.  Skin:  Negative for rash.  Neurological:  Negative for headaches.  Hematological:  Negative for adenopathy.     Past Medical History:  Diagnosis Date   Anxiety    Chlamydia    Depression    Drug induced constipation    Hemorrhoids    HIV infection (HCC)    Hyperlipidemia     Hypertension    Pt went off meds on his own; has been monitored without any problems   Nicotine dependence    Syphilis 2013 and 2015    Outpatient Medications Prior to Visit  Medication Sig Dispense Refill   amoxicillin -clavulanate (AUGMENTIN ) 875-125 MG tablet Take 1 tablet by mouth 2 (two) times daily. 14 tablet 0   cabotegravir  & rilpivirine  ER (CABENUVA ) 600 & 900 MG/3ML injection Inject 1 kit into the muscle every 2 (two) months. 6 mL 5   escitalopram (LEXAPRO) 20 MG tablet Take 20 mg by mouth daily.     ibuprofen  (ADVIL ) 800 MG tablet Take 1 tablet (800 mg total) by mouth every 8 (eight) hours as needed (pain). (Patient not taking: Reported on 09/06/2023) 21 tablet 0   imiquimod  (ALDARA ) 5 % cream Apply topically 3 (three) times a week. (Patient not taking: Reported on 09/06/2023) 12 each 2   OLANZapine  (ZYPREXA ) 20 MG tablet Take 20 mg by mouth at bedtime.     traZODone  (DESYREL ) 50 MG tablet Take 50 mg by mouth at bedtime.     No facility-administered medications prior to visit.     Allergies  Allergen Reactions   Bee Venom Swelling and Other (See Comments)    Large local reactions   Lisinopril  Swelling and Other (See Comments)    Facial and lip swelling    Social History   Tobacco Use  Smoking status: Light Smoker    Current packs/day: 0.25    Types: Cigarettes   Smokeless tobacco: Never   Tobacco comments:    7-8 cigarettes a day  Vaping Use   Vaping status: Never Used  Substance Use Topics   Alcohol use: Yes    Alcohol/week: 0.0 standard drinks of alcohol    Comment: occasional   Drug use: Not Currently    Frequency: 3.0 times per week    Types: Marijuana    Family History  Problem Relation Age of Onset   Hypertension Father    Hypertension Maternal Grandmother     Social History   Substance and Sexual Activity  Sexual Activity Not Currently   Partners: Male   Comment: given condoms     Objective:   There were no vitals filed for this  visit.  There is no height or weight on file to calculate BMI.  Physical Exam Constitutional:      Appearance: Normal appearance. He is not ill-appearing.  HENT:     Head: Normocephalic.     Nose: Congestion present.     Mouth/Throat:     Mouth: Mucous membranes are moist.     Pharynx: Oropharynx is clear. Posterior oropharyngeal erythema present. No oropharyngeal exudate.  Eyes:     General: Lids are normal. No scleral icterus.    Conjunctiva/sclera:     Right eye: Chemosis present. No exudate.    Left eye: Chemosis present. No exudate.    Pupils: Pupils are equal, round, and reactive to light.  Pulmonary:     Effort: Pulmonary effort is normal.  Musculoskeletal:        General: Normal range of motion.     Cervical back: Normal range of motion.  Skin:    Coloration: Skin is not jaundiced or pale.  Neurological:     Mental Status: He is alert and oriented to person, place, and time.  Psychiatric:        Mood and Affect: Mood normal.        Judgment: Judgment normal.     Lab Results Lab Results  Component Value Date   WBC 12.3 (H) 11/01/2023   HGB 15.8 11/01/2023   HCT 46.7 11/01/2023   MCV 90.0 11/01/2023   PLT 412 (H) 11/01/2023    Lab Results  Component Value Date   CREATININE 0.81 09/06/2023   BUN 6 (L) 09/06/2023   NA 138 09/06/2023   K 3.4 (L) 09/06/2023   CL 103 09/06/2023   CO2 25 09/06/2023    Lab Results  Component Value Date   ALT 9 09/06/2023   AST 15 09/06/2023   ALKPHOS 85 04/18/2021   BILITOT 0.4 09/06/2023    Lab Results  Component Value Date   CHOL 244 (H) 11/01/2023   HDL 41 11/01/2023   LDLCALC 185 (H) 11/01/2023   TRIG 79 11/01/2023   CHOLHDL 6.0 (H) 11/01/2023   HIV 1 RNA Quant (Copies/mL)  Date Value  09/06/2023 Not Detected  03/01/2023 21 (H)  10/27/2022 Not Detected   CD4 T Cell Abs (/uL)  Date Value  09/06/2023 808  03/01/2023 903  09/02/2022 1,196     Assessment & Plan:     HIV -  Doing well on cabenuva   injection Q2m on 29th. Continue 2x annual monitoring.  Needs updated anal pap smear in 2026 and Quantiferon screen at next visit with me in August.  No dental needs. No mood concerns.  -Asymptomatic STI screening collected today  -  HIV VL / CD4 and CMP / CBC today for monitoring.  -FU with pharmacy team for injections, next on 11/01/2023  Sneezing / Nasal Congestion -  Symptoms likely due to either viral URI vs seasonal allergies. Throat is a little red but no swelling. No purulence or lesions noted on tongue, cheeks or palate.  - Recommend over-the-counter antihistamines. - Avoid decongestants outside of Coricidin d/t elevated blood pressure - Flonase would probably be helpful as well daily   Cold Sore - Recent cold sore likely viral. Symptoms resolved. No current signs of syphilis.  - Check syphilis titer in blood work - previously serofast at 1:64   Hypertension - Intermittent hypertension noted. Blood pressure may be influenced by decongestants, smoking, and alcohol. Discussed long-term risks of untreated hypertension. Advised on monitoring and considering medication if persistent. - Monitor blood pressure at home using an arm cuff . - Recheck blood pressure at next pharmacy visit for Cabenuva  injection. - Consider resuming antihypertensive medication if hypertension persists. - Encourage reduction in smoking and alcohol consumption.  Follow-up Next appointment scheduled for April 28th. - Perform blood work and swab work. - Confirm next appointment on April 28th.      No orders of the defined types were placed in this encounter.  No orders of the defined types were placed in this encounter.  11/01/2023 next cabenuva  injection with pharmacy team.    [name] Fireman, MSN, NP-C [facility] Pager: [phone] Office: [phone]  02/28/24  2:04 PM

## 2024-02-28 NOTE — Patient Instructions (Signed)
 We can update your blood work in October when you come back on 05/01/2024

## 2024-04-19 ENCOUNTER — Other Ambulatory Visit (HOSPITAL_COMMUNITY): Payer: Self-pay

## 2024-04-19 ENCOUNTER — Other Ambulatory Visit: Payer: Self-pay

## 2024-04-19 NOTE — Progress Notes (Signed)
 Specialty Pharmacy Refill Coordination Note  Jeffery [name] is a 33 y.o. male assessed today regarding refills of clinic administered specialty medication(s) Cabotegravir  & Rilpivirine  (CABENUVA )   Clinic requested Courier to Provider Office   Delivery date: 04/25/24   Verified address: [address] [place] [address]   Medication will be filled on 04/24/24.

## 2024-04-24 ENCOUNTER — Other Ambulatory Visit: Payer: Self-pay

## 2024-04-25 ENCOUNTER — Telehealth: Payer: Self-pay

## 2024-04-25 NOTE — Telephone Encounter (Signed)
 [facility] Patient Advocate Encounter  Patient's medications CABENUVA  have been couriered to [facility] from [facility] and will be administered at the patients appointment on 05/01/24.  Charmaine Sharps, CPhT Specialty Pharmacy Patient [facility] for Infectious Disease Phone: [phone] Fax:  [phone]

## 2024-04-28 NOTE — Progress Notes (Signed)
 HPI: Jeffery [name] is a 33 y.o. male who presents to the [facility] pharmacy clinic for Cabenuva  administration.  Referring ID Provider: [name] Fireman, ID NP  Patient Active Problem List   Diagnosis Date Noted   Rectal abscess 03/03/2023   Hemorrhoids    Schizoaffective disorder, depressive type (HCC) 04/18/2021   Genital warts 05/21/2020   Syphilis 05/21/2020   Assessment of effects of psychotropic drug in patient at risk for metabolic syndrome 09/16/2018   Hypertension 10/05/2016   MDD (major depressive disorder), recurrent severe, without psychosis (HCC) 02/25/2016   Cannabis use disorder, severe, dependence (HCC) 02/25/2016   Bipolar disorder (HCC) 06/21/2014   Anxiety 06/21/2014   Cigarette smoker 06/21/2014   HIV disease (HCC) 06/20/2014   Dyslipidemia 06/20/2014    Patient's Medications  New Prescriptions   No medications on file  Previous Medications   AMOXICILLIN -CLAVULANATE (AUGMENTIN ) 875-125 MG TABLET    Take 1 tablet by mouth 2 (two) times daily.   CABOTEGRAVIR  & RILPIVIRINE  ER (CABENUVA ) 600 & 900 MG/3ML INJECTION    Inject 1 kit into the muscle every 2 (two) months.   ESCITALOPRAM (LEXAPRO) 20 MG TABLET    Take 20 mg by mouth daily.   IBUPROFEN  (ADVIL ) 800 MG TABLET    Take 1 tablet (800 mg total) by mouth every 8 (eight) hours as needed (pain).   IMIQUIMOD  (ALDARA ) 5 % CREAM    Apply topically 3 (three) times a week.   OLANZAPINE  (ZYPREXA ) 20 MG TABLET    Take 20 mg by mouth at bedtime.   TRAZODONE  (DESYREL ) 50 MG TABLET    Take 50 mg by mouth at bedtime.  Modified Medications   No medications on file  Discontinued Medications   No medications on file    Allergies: Allergies  Allergen Reactions   Bee Venom Swelling and Other (See Comments)    Large local reactions   Lisinopril  Swelling and Other (See Comments)    Facial and lip swelling    Labs: Lab Results  Component Value Date   HIV1RNAQUANT Not Detected 09/06/2023   HIV1RNAQUANT 21 (H)  03/01/2023   HIV1RNAQUANT Not Detected 10/27/2022   CD4TABS 808 09/06/2023   CD4TABS 903 03/01/2023   CD4TABS 1,196 09/02/2022    RPR and STI Lab Results  Component Value Date   LABRPR REACTIVE (A) 09/06/2023   LABRPR REACTIVE (A) 03/01/2023   LABRPR REACTIVE (A) 12/21/2022   LABRPR REACTIVE (A) 10/27/2022   LABRPR REACTIVE (A) 04/28/2022   RPRTITER 1:32 (H) 09/06/2023   RPRTITER 1:64 (H) 03/01/2023   RPRTITER 1:64 (H) 12/21/2022   RPRTITER 1:512 (H) 10/27/2022   RPRTITER 1:8 (H) 04/28/2022    STI Results GC CT  09/06/2023  2:45 PM Negative    Negative  Negative    Negative   03/01/2023  2:47 PM Negative    Negative    Negative  Negative    Negative    Negative   12/31/2022  3:50 PM Negative  Negative   12/21/2022  3:59 PM Positive    Positive    Negative  Negative    Negative    Negative   10/27/2022  2:23 PM Negative    Negative    Negative  Negative    Negative    Negative   06/03/2022 11:47 AM Negative    Negative    Negative  Negative    Negative    Negative   10/03/2019 12:00 AM Negative  Negative   12/01/2017 12:00 AM Negative  C **POSITIVE**  C  08/24/2014 12:00 AM NG: Negative  CT: Negative   06/07/2014 12:00 AM NG: Negative  CT: Negative     C Corrected result    Hepatitis B Lab Results  Component Value Date   HEPBSAB POS (A) 06/07/2014   HEPBSAG NON REACTIVE 04/18/2021   HEPBCAB NON REACTIVE 06/07/2014   Hepatitis C No results found for: HEPCAB, HCVRNAPCRQN Hepatitis A Lab Results  Component Value Date   HAV REACTIVE (A) 11/01/2023   Lipids: Lab Results  Component Value Date   CHOL 244 (H) 11/01/2023   TRIG 79 11/01/2023   HDL 41 11/01/2023   CHOLHDL 6.0 (H) 11/01/2023   VLDL 19 04/18/2021   LDLCALC 185 (H) 11/01/2023    Target Date: The 29th  Assessment: Jeffery [name] presents today for his maintenance Cabenuva  injections. Past injections were tolerated well without issues. Last HIV RNA was not detected in March. Doing well  with no issues today.  Lab work:  Due for annual lab work today and a viral load recheck. Also agrees to STI testing today.  Eligible vaccinations:  Due for annual flu and COVID vaccines and accepts them today  Cabenuva : Administered cabotegravir  600mg /33mL in left upper outer quadrant of the gluteal muscle. Administered rilpivirine  900 mg/3mL in the right upper outer quadrant of the gluteal muscle. No issues with injections. He will follow up in 2 months for next set of injections.  Plan: - Cabenuva  injections administered - HIV RNA, CD4, RPR, CMP, and CBC today - Urine/rectal/pharyngeal cytologies for GC/chlamydia  - Administered 2025-2026 influenza vaccine - Administered the 2025-2026 COVID vaccine today - Next injections scheduled for 06/26/24 with me and 09/04/24 with [name] - Call with any issues or questions  Jeffery [name] L. Khye Hochstetler, PharmD, BCIDP, AAHIVP, CPP Clinical Pharmacist Practitioner - Infectious Diseases Clinical Pharmacist Lead - Specialty Pharmacy [facility] for Infectious Disease

## 2024-05-01 ENCOUNTER — Other Ambulatory Visit: Payer: Self-pay

## 2024-05-01 ENCOUNTER — Other Ambulatory Visit (HOSPITAL_COMMUNITY)
Admission: RE | Admit: 2024-05-01 | Discharge: 2024-05-01 | Disposition: A | Discharge status: Home / Self Care | Source: Ambulatory Visit | Attending: Infectious Diseases | Admitting: Infectious Diseases

## 2024-05-01 ENCOUNTER — Ambulatory Visit: Payer: Self-pay | Admitting: Pharmacist

## 2024-05-01 DIAGNOSIS — Z23 Encounter for immunization: Secondary | ICD-10-CM

## 2024-05-01 DIAGNOSIS — Z113 Encounter for screening for infections with a predominantly sexual mode of transmission: Secondary | ICD-10-CM | POA: Diagnosis present

## 2024-05-01 DIAGNOSIS — Z79899 Other long term (current) drug therapy: Secondary | ICD-10-CM

## 2024-05-01 DIAGNOSIS — B20 Human immunodeficiency virus [HIV] disease: Secondary | ICD-10-CM | POA: Diagnosis present

## 2024-05-01 MED ORDER — CABOTEGRAVIR & RILPIVIRINE ER 600 & 900 MG/3ML IM SUER
1.0000 | Freq: Once | INTRAMUSCULAR | Status: AC
  Administered 2024-05-01: 1 via INTRAMUSCULAR

## 2024-05-02 LAB — CYTOLOGY, (ORAL, ANAL, URETHRAL) ANCILLARY ONLY
Chlamydia: NEGATIVE
Chlamydia: NEGATIVE
Comment: NEGATIVE
Comment: NEGATIVE
Comment: NORMAL
Comment: NORMAL
Neisseria Gonorrhea: NEGATIVE
Neisseria Gonorrhea: NEGATIVE

## 2024-05-02 LAB — URINE CYTOLOGY ANCILLARY ONLY
Chlamydia: NEGATIVE
Comment: NEGATIVE
Comment: NORMAL
Neisseria Gonorrhea: NEGATIVE

## 2024-05-02 LAB — T-HELPER CELLS (CD4) COUNT (NOT AT ARMC)
CD4 % Helper T Cell: 35 % (ref 33–65)
CD4 T Cell Abs: 1137 /uL (ref 400–1790)

## 2024-05-04 LAB — CBC WITH DIFFERENTIAL/PLATELET
Absolute Lymphocytes: 3422 {cells}/uL (ref 850–3900)
Absolute Monocytes: 770 {cells}/uL (ref 200–950)
Basophils Absolute: 73 {cells}/uL (ref 0–200)
Basophils Relative: 0.7 %
Eosinophils Absolute: 458 {cells}/uL (ref 15–500)
Eosinophils Relative: 4.4 %
HCT: 47 % (ref 38.5–50.0)
Hemoglobin: 15.5 g/dL (ref 13.2–17.1)
MCH: 30.6 pg (ref 27.0–33.0)
MCHC: 33 g/dL (ref 32.0–36.0)
MCV: 92.9 fL (ref 80.0–100.0)
MPV: 10 fL (ref 7.5–12.5)
Monocytes Relative: 7.4 %
Neutro Abs: 5678 {cells}/uL (ref 1500–7800)
Neutrophils Relative %: 54.6 %
Platelets: 418 Thousand/uL — ABNORMAL HIGH (ref 140–400)
RBC: 5.06 Million/uL (ref 4.20–5.80)
RDW: 13.7 % (ref 11.0–15.0)
Total Lymphocyte: 32.9 %
WBC: 10.4 Thousand/uL (ref 3.8–10.8)

## 2024-05-04 LAB — COMPREHENSIVE METABOLIC PANEL WITH GFR
AG Ratio: 1.5 (calc) (ref 1.0–2.5)
ALT: 12 U/L (ref 9–46)
AST: 15 U/L (ref 10–40)
Albumin: 4.3 g/dL (ref 3.6–5.1)
Alkaline phosphatase (APISO): 96 U/L (ref 36–130)
BUN: 9 mg/dL (ref 7–25)
CO2: 24 mmol/L (ref 20–32)
Calcium: 9.4 mg/dL (ref 8.6–10.3)
Chloride: 106 mmol/L (ref 98–110)
Creat: 0.91 mg/dL (ref 0.60–1.26)
Globulin: 2.8 g/dL (ref 1.9–3.7)
Glucose, Bld: 79 mg/dL (ref 65–99)
Potassium: 4 mmol/L (ref 3.5–5.3)
Sodium: 138 mmol/L (ref 135–146)
Total Bilirubin: 0.8 mg/dL (ref 0.2–1.2)
Total Protein: 7.1 g/dL (ref 6.1–8.1)
eGFR: 114 mL/min/1.73m2 (ref >=60)

## 2024-05-04 LAB — HIV-1 RNA QUANT-NO REFLEX-BLD
HIV 1 RNA Quant: NOT DETECTED {copies}/mL
HIV-1 RNA Quant, Log: NOT DETECTED {Log_copies}/mL

## 2024-05-04 LAB — RPR: RPR Ser Ql: REACTIVE — AB

## 2024-05-04 LAB — T PALLIDUM AB: T Pallidum Abs: POSITIVE — AB

## 2024-05-04 LAB — RPR TITER: RPR Titer: 1:16 {titer} — ABNORMAL HIGH

## 2024-05-11 ENCOUNTER — Encounter: Payer: Self-pay | Admitting: Pharmacist

## 2024-06-07 ENCOUNTER — Other Ambulatory Visit: Payer: Self-pay

## 2024-06-07 ENCOUNTER — Other Ambulatory Visit: Payer: Self-pay | Admitting: Pharmacist

## 2024-06-07 ENCOUNTER — Other Ambulatory Visit (HOSPITAL_COMMUNITY): Payer: Self-pay

## 2024-06-07 DIAGNOSIS — B20 Human immunodeficiency virus [HIV] disease: Secondary | ICD-10-CM

## 2024-06-07 MED ORDER — CABOTEGRAVIR & RILPIVIRINE ER 600 & 900 MG/3ML IM SUER
1.0000 | INTRAMUSCULAR | 5 refills | Status: AC
  Filled 2024-06-07: qty 6, 60d supply, fill #0

## 2024-06-07 NOTE — Progress Notes (Signed)
 Specialty Pharmacy Refill Coordination Note  Riggins Cisek is a 33 y.o. male assessed today regarding refills of clinic administered specialty medication(s) Cabotegravir  & Rilpivirine  (CABENUVA )   Clinic requested Courier to Provider Office   Delivery date: 06/22/24   Verified address: [address] [place] [address]   Medication will be filled on 06/21/24.

## 2024-06-21 ENCOUNTER — Other Ambulatory Visit: Payer: Self-pay

## 2024-06-21 ENCOUNTER — Encounter: Payer: Self-pay | Admitting: Infectious Diseases

## 2024-06-21 ENCOUNTER — Ambulatory Visit: Admitting: Infectious Diseases

## 2024-06-21 ENCOUNTER — Other Ambulatory Visit (HOSPITAL_COMMUNITY)
Admission: RE | Admit: 2024-06-21 | Discharge: 2024-06-21 | Disposition: A | Source: Ambulatory Visit | Attending: Infectious Diseases | Admitting: Infectious Diseases

## 2024-06-21 VITALS — BP 136/95 | HR 100 | Temp 98.1°F | Ht 66.5 in | Wt 160.0 lb

## 2024-06-21 DIAGNOSIS — I10 Essential (primary) hypertension: Secondary | ICD-10-CM | POA: Diagnosis not present

## 2024-06-21 DIAGNOSIS — E785 Hyperlipidemia, unspecified: Secondary | ICD-10-CM | POA: Diagnosis not present

## 2024-06-21 DIAGNOSIS — B20 Human immunodeficiency virus [HIV] disease: Secondary | ICD-10-CM | POA: Diagnosis not present

## 2024-06-21 DIAGNOSIS — Z79899 Other long term (current) drug therapy: Secondary | ICD-10-CM

## 2024-06-21 DIAGNOSIS — A539 Syphilis, unspecified: Secondary | ICD-10-CM

## 2024-06-21 DIAGNOSIS — Z113 Encounter for screening for infections with a predominantly sexual mode of transmission: Secondary | ICD-10-CM

## 2024-06-21 DIAGNOSIS — F418 Other specified anxiety disorders: Secondary | ICD-10-CM | POA: Diagnosis not present

## 2024-06-21 MED ORDER — DOXYCYCLINE HYCLATE 100 MG PO TABS: 100.0000 mg | ORAL_TABLET | Freq: Two times a day (BID) | ORAL | 0 refills | Status: AC

## 2024-06-21 NOTE — Progress Notes (Addendum)
 [address], [place], [place], [address]                                                                  Phn. [phone]; Fax: [phone]                                                                             Date: 06/21/24  Reason for Visit: Routine HIV care.    HPI: Jeffery [name] is a 33 y.o.old male with a history of HIV, anxiety/depression, HTN, HLD, syphilis s/p tx who is here due to recent RPR titre at [facility] reported to be 1:64  Interval hx/current visit: Reports having oral sex with 2 male partners, oral sex recently but no other symptoms including neurological/GU/rashes, whatsoever. Next cabenuva  injection is Monday. No other concerns.  ROS: As stated in above HPI; all other systems were reviewed and are otherwise negative unless noted below  No reported fever / chills, night sweats, unintentional weight loss, acute visual change, odynophagia, chest pain/pressure, new or worsened SOB or WOB, nausea, vomiting, diarrhea, dysuria, GU discharge, syncope, seizures, red/hot swollen joints, hallucinations / delusions, rashes, new allergies, unusual / excessive bleeding, swollen lymph nodes, or new hospitalizations/ED visits/Urgent Care visits since the pt was last seen.  PMH/ PSH/ FamHx / Social Hx , medications and allergies reviewed and updated as appropriate; please see corresponding tab in EHR / prior notes  Outpatient Encounter Medications as of 06/21/2024  Medication Sig   cabotegravir  & rilpivirine  ER (CABENUVA ) 600 & 900 MG/3ML injection Inject 1 kit into the muscle every 2 (two) months.   doxycycline  (VIBRA -TABS) 100 MG tablet Take 1 tablet (100 mg total) by mouth 2 (two) times daily for 14 days.   escitalopram (LEXAPRO) 20 MG tablet Take 20 mg by mouth daily.    OLANZapine  (ZYPREXA ) 20 MG tablet Take 20 mg by mouth at bedtime.   traZODone  (DESYREL ) 50 MG tablet Take 50 mg by mouth at bedtime.   [DISCONTINUED] amoxicillin -clavulanate (AUGMENTIN ) 875-125 MG tablet Take 1 tablet by mouth 2 (two) times daily. (Patient not taking: Reported on 02/28/2024)   [DISCONTINUED] ibuprofen  (ADVIL ) 800 MG tablet Take 1 tablet (800 mg total) by mouth every 8 (eight) hours as needed (pain). (Patient not taking: Reported on 02/28/2024)   [DISCONTINUED] imiquimod  (ALDARA ) 5 % cream Apply topically 3 (three) times a week. (Patient not taking: Reported on 02/28/2024)   No facility-administered encounter medications on file as of 06/21/2024.  Past Medical History:  Diagnosis Date   Anxiety    Chlamydia    Depression    Drug induced constipation    Hemorrhoids    HIV infection (HCC)    Hyperlipidemia    Hypertension    Pt went off meds on his own; has been monitored without any problems   Nicotine dependence    Syphilis 2013 and 2015   No past surgical history on file.  Social History   Socioeconomic History   Marital status: Single    Spouse name: Not on file   Number of children: Not on file   Years of education: Not on file   Highest education level: Not on file  Occupational History   Occupation: restraunt    Employer: [employer]  Tobacco Use   Smoking status: Light Smoker    Current packs/day: 0.25    Types: Cigarettes   Smokeless tobacco: Never   Tobacco comments:    7-8 cigarettes a day  Vaping Use   Vaping status: Never Used  Substance and Sexual Activity   Alcohol use: Yes    Alcohol/week: 0.0 standard drinks of alcohol    Comment: occasional   Drug use: Yes    Frequency: 3.0 times per week    Types: Marijuana, Cocaine   Sexual activity: Yes    Partners: Male    Comment: given condoms  Other Topics Concern   Not on file  Social History Narrative   Not on file   Social Drivers of Health    Tobacco Use: High Risk (06/21/2024)   Patient History    Smoking Tobacco Use: Light Smoker    Smokeless Tobacco Use: Never    Passive Exposure: Not on file  Financial Resource Strain: Not on file  Food Insecurity: Not on file  Transportation Needs: Not on file  Physical Activity: Not on file  Stress: Not on file  Social Connections: Not on file  Intimate Partner Violence: Not on file  Depression (PHQ2-9): Medium Risk (02/28/2024)   Depression (PHQ2-9)    PHQ-2 Score: 10  Alcohol Screen: Not on file  Housing: Not on file  Utilities: Not on file  Health Literacy: Not on file   Family History  Problem Relation Age of Onset   Hypertension Father    Hypertension Maternal Grandmother    Vitals  BP (!) 136/95   Pulse 100   Temp 98.1 F (36.7 C) (Temporal)   Ht 5' 6.5 (1.689 m)   Wt 160 lb (72.6 kg)   SpO2 95%   BMI 25.44 kg/m   Examination  Gen: no acute distress HEENT: [place]/AT, no scleral icterus, no pale conjunctivae, hearing normal, oral mucosa moist Neck: Supple Cardio: Normal HR Resp: Pulmonary effort normal in room air GI: nondistended GU: Musc: Extremities: No pedal edema Skin: No rashes Neuro: grossly non focal , awake, alert and oriented * 3  Psych: Calm, cooperative   Lab Results HIV 1 RNA Quant  Date Value  05/01/2024 NOT DETECTED copies/mL  09/06/2023 Not Detected Copies/mL  03/01/2023 21 Copies/mL (H)   CD4 T Cell Abs (/uL)  Date Value  05/01/2024 1,137  09/06/2023 808  03/01/2023 903   Lab Results  Component Value Date   HIV1GENOSEQ REPORT 06/18/2015   Lab Results  Component Value Date   WBC 10.4 05/01/2024   HGB 15.5 05/01/2024   HCT 47.0 05/01/2024   MCV 92.9 05/01/2024   PLT 418 (H) 05/01/2024    Lab Results  Component Value Date  CREATININE 0.91 05/01/2024   BUN 9 05/01/2024   NA 138 05/01/2024   K 4.0 05/01/2024   CL 106 05/01/2024   CO2 24 05/01/2024   Lab Results  Component Value Date   ALT 12 05/01/2024   AST 15  05/01/2024   ALKPHOS 85 04/18/2021   BILITOT 0.8 05/01/2024    Lab Results  Component Value Date   CHOL 244 (H) 11/01/2023   TRIG 79 11/01/2023   HDL 41 11/01/2023   LDLCALC 185 (H) 11/01/2023   Lab Results  Component Value Date   HAV REACTIVE (A) 11/01/2023   Lab Results  Component Value Date   HEPBSAG NON REACTIVE 04/18/2021   HEPBSAB POS (A) 06/07/2014   Lab Results  Component Value Date   HCVAB NON REACTIVE 04/18/2021   Lab Results  Component Value Date   CHLAMYDIAWP Negative 05/01/2024   CHLAMYDIAWP Negative 05/01/2024   CHLAMYDIAWP Negative 05/01/2024   N Negative 05/01/2024   N Negative 05/01/2024   N Negative 05/01/2024   No results found for: GCPROBEAPT Lab Results  Component Value Date   QUANTGOLD NEGATIVE 06/07/2014    Health Maintenance: Immunization History  Administered Date(s) Administered   HPV 9-valent 09/04/2014   HPV Quadrivalent 06/18/2015, 07/23/2015   Hepatitis A, Adult 06/21/2014, 06/18/2015, 10/27/2022   Influenza, Seasonal, Injecte, Preservative Fre 04/27/2023, 05/01/2024   Influenza,inj,Quad PF,6+ Mos 06/21/2014, 06/18/2015, 06/16/2016, 04/28/2017, 05/22/2019, 05/21/2020, 04/29/2021, 04/28/2022   Meningococcal Mcv4o 10/05/2016, 10/27/2022   PNEUMOCOCCAL CONJUGATE-20 10/27/2022   Pfizer(Comirnaty)Fall Seasonal Vaccine 12 years and older 04/28/2022, 04/27/2023, 05/01/2024   Pneumococcal Polysaccharide-23 06/21/2014   Tdap 06/16/2016   Zoster Recombinant(Shingrix ) 11/01/2023, 01/03/2024   Assessment/Plan: # Early latent syphilis - Per [facility], RPR in Nov 24/25 1: 64. Was 1: 16 @ 05/01/24 - no symptoms and signs of neurosyphilis - no symptoms whatsoever - will do doxycycline  100mg  po bid as no benzathine pen G available  - RPR to be done in subsequent visit in 3 months   # HIV - continue Cabenuva  Lab Results  Component Value Date   HIV1RNAQUANT NOT DETECTED 05/01/2024   Lab Results  Component Value Date   CD4TABS 1,137  05/01/2024   CD4TABS 808 09/06/2023   CD4TABS 903 03/01/2023  - HIV RNA today   # STD screening  - Urine/oral and anal GC + RPR  - no acute symptoms   I personally spent a total 32  minutes in the care of the patient today including preparing to see the patient, getting/reviewing separately obtained history, performing a medically appropriate exam/evaluation, counseling and educating, placing orders, documenting clinical information in the EHR, independently interpreting results, communicating results, and coordinating care.  Of note, portions of this note may have been created with voice recognition software. While this note has been edited for accuracy, occasional wrong-word or sound-a-like substitutions may have occurred due to the inherent limitations of voice recognition software.   Electronically signed by:  Annalee Orem, MD Infectious Disease Physician [facility] for Infectious Disease [address] [place], [place] [address] Phone: [phone]  Fax: [phone]

## 2024-06-22 ENCOUNTER — Telehealth: Payer: Self-pay

## 2024-06-22 NOTE — Telephone Encounter (Signed)
 [facility] Patient Advocate Encounter  Patient's medications CABENUVA  have been couriered to [facility] from [facility] and will be administered at the patients appointment on 06/26/24.  Charmaine Sharps, CPhT Specialty Pharmacy Patient [facility] for Infectious Disease Phone: [phone] Fax:  [phone]

## 2024-06-22 NOTE — Progress Notes (Unsigned)
 HPI: Jeffery [name] is a 33 y.o. male who presents to the [facility] pharmacy clinic for Cabenuva  administration.  Referring ID Provider: [name] Fireman, NP  Patient Active Problem List   Diagnosis Date Noted   Screening for venereal disease 06/21/2024   Encounter for long-term (current) use of medications 06/21/2024   Rectal abscess 03/03/2023   Hemorrhoids    Schizoaffective disorder, depressive type (HCC) 04/18/2021   Genital warts 05/21/2020   Syphilis 05/21/2020   Assessment of effects of psychotropic drug in patient at risk for metabolic syndrome 09/16/2018   Hypertension 10/05/2016   MDD (major depressive disorder), recurrent severe, without psychosis (HCC) 02/25/2016   Cannabis use disorder, severe, dependence (HCC) 02/25/2016   Bipolar disorder (HCC) 06/21/2014   Anxiety 06/21/2014   Cigarette smoker 06/21/2014   HIV disease (HCC) 06/20/2014   Dyslipidemia 06/20/2014    Patient's Medications  New Prescriptions   No medications on file  Previous Medications   CABOTEGRAVIR  & RILPIVIRINE  ER (CABENUVA ) 600 & 900 MG/3ML INJECTION    Inject 1 kit into the muscle every 2 (two) months.   DOXYCYCLINE  (VIBRA -TABS) 100 MG TABLET    Take 1 tablet (100 mg total) by mouth 2 (two) times daily for 14 days.   ESCITALOPRAM (LEXAPRO) 20 MG TABLET    Take 20 mg by mouth daily.   OLANZAPINE  (ZYPREXA ) 20 MG TABLET    Take 20 mg by mouth at bedtime.   TRAZODONE  (DESYREL ) 50 MG TABLET    Take 50 mg by mouth at bedtime.  Modified Medications   No medications on file  Discontinued Medications   No medications on file    Allergies: Allergies[1]  Labs: Lab Results  Component Value Date   HIV1RNAQUANT NOT DETECTED 05/01/2024   HIV1RNAQUANT Not Detected 09/06/2023   HIV1RNAQUANT 21 (H) 03/01/2023   CD4TABS 1,137 05/01/2024   CD4TABS 808 09/06/2023   CD4TABS 903 03/01/2023    RPR and STI Lab Results  Component Value Date   LABRPR REACTIVE (A) 05/01/2024   LABRPR REACTIVE (A)  09/06/2023   LABRPR REACTIVE (A) 03/01/2023   LABRPR REACTIVE (A) 12/21/2022   LABRPR REACTIVE (A) 10/27/2022   RPRTITER 1:16 (H) 05/01/2024   RPRTITER 1:32 (H) 09/06/2023   RPRTITER 1:64 (H) 03/01/2023   RPRTITER 1:64 (H) 12/21/2022   RPRTITER 1:512 (H) 10/27/2022    STI Results GC CT  05/01/2024  2:03 PM Negative    Negative    Negative  Negative    Negative    Negative   09/06/2023  2:45 PM Negative    Negative  Negative    Negative   03/01/2023  2:47 PM Negative    Negative    Negative  Negative    Negative    Negative   12/31/2022  3:50 PM Negative  Negative   12/21/2022  3:59 PM Positive    Positive    Negative  Negative    Negative    Negative   10/27/2022  2:23 PM Negative    Negative    Negative  Negative    Negative    Negative   06/03/2022 11:47 AM Negative    Negative    Negative  Negative    Negative    Negative   10/03/2019 12:00 AM Negative  Negative   12/01/2017 12:00 AM Negative  C **POSITIVE**  C  08/24/2014 12:00 AM NG: Negative  CT: Negative   06/07/2014 12:00 AM NG: Negative  CT: Negative     C Corrected result  Hepatitis B Lab Results  Component Value Date   HEPBSAB POS (A) 06/07/2014   HEPBSAG NON REACTIVE 04/18/2021   HEPBCAB NON REACTIVE 06/07/2014   Hepatitis C No results found for: HEPCAB, HCVRNAPCRQN Hepatitis A Lab Results  Component Value Date   HAV REACTIVE (A) 11/01/2023   Lipids: Lab Results  Component Value Date   CHOL 244 (H) 11/01/2023   TRIG 79 11/01/2023   HDL 41 11/01/2023   CHOLHDL 6.0 (H) 11/01/2023   VLDL 19 04/18/2021   LDLCALC 185 (H) 11/01/2023    Target Date: The 29th  Assessment: Jeffery [name] presents today for his maintenance Cabenuva  injections. Past injections were tolerated well without isshisues. Last HIV RNA was not detected in October. Doing well with no issues today.  Lab work:  Recently saw Dr. Dea on Wednesday for concerns of syphilis. RPR at that visit was  ***.  Eligible vaccinations:  Currently up to date on all recommended vaccines.   Cabenuva : Administered cabotegravir  600mg /23mL in left upper outer quadrant of the gluteal muscle. Administered rilpivirine  900 mg/3mL in the right upper outer quadrant of the gluteal muscle. No issues with injections. He will follow up in 2 months for next set of injections.  Plan: - Cabenuva  injections administered - Next injections scheduled for 09/04/24 with [name] and *** with me - Call with any issues or questions  Genine Beckett L. Lyvia Mondesir, PharmD, BCIDP, AAHIVP, CPP Clinical Pharmacist Practitioner - Infectious Diseases Clinical Pharmacist Lead - [facility]     [1]  Allergies Allergen Reactions   Bee Venom Swelling and Other (See Comments)    Large local reactions   Lisinopril  Swelling and Other (See Comments)    Facial and lip swelling

## 2024-06-23 LAB — CYTOLOGY, (ORAL, ANAL, URETHRAL) ANCILLARY ONLY
Chlamydia: NEGATIVE
Chlamydia: NEGATIVE
Comment: NEGATIVE
Comment: NEGATIVE
Comment: NORMAL
Comment: NORMAL
Neisseria Gonorrhea: NEGATIVE
Neisseria Gonorrhea: POSITIVE — AB

## 2024-06-23 LAB — URINE CYTOLOGY ANCILLARY ONLY
Chlamydia: NEGATIVE
Comment: NEGATIVE
Comment: NORMAL
Neisseria Gonorrhea: NEGATIVE

## 2024-06-25 LAB — RPR TITER: RPR Titer: 1:16 {titer} — ABNORMAL HIGH

## 2024-06-25 LAB — HIV RNA, RTPCR W/R GT (RTI, PI,INT)
HIV 1 RNA Quant: NOT DETECTED {copies}/mL
HIV-1 RNA Quant, Log: NOT DETECTED {Log_copies}/mL

## 2024-06-25 LAB — T PALLIDUM AB: T Pallidum Abs: POSITIVE — AB

## 2024-06-25 LAB — SYPHILIS: RPR W/REFLEX TO RPR TITER AND TREPONEMAL ANTIBODIES, TRADITIONAL SCREENING AND DIAGNOSIS ALGORITHM: RPR Ser Ql: REACTIVE — AB

## 2024-06-26 ENCOUNTER — Ambulatory Visit (INDEPENDENT_AMBULATORY_CARE_PROVIDER_SITE_OTHER): Admitting: Pharmacist

## 2024-06-26 ENCOUNTER — Other Ambulatory Visit: Payer: Self-pay

## 2024-06-26 ENCOUNTER — Telehealth: Payer: Self-pay

## 2024-06-26 DIAGNOSIS — A549 Gonococcal infection, unspecified: Secondary | ICD-10-CM | POA: Diagnosis not present

## 2024-06-26 DIAGNOSIS — B20 Human immunodeficiency virus [HIV] disease: Secondary | ICD-10-CM

## 2024-06-26 MED ORDER — CEFTRIAXONE SODIUM 500 MG IJ SOLR
500.0000 mg | Freq: Once | INTRAMUSCULAR | Status: AC
  Administered 2024-06-26: 500 mg via INTRAMUSCULAR

## 2024-06-26 MED ORDER — CABOTEGRAVIR & RILPIVIRINE ER 600 & 900 MG/3ML IM SUER
1.0000 | Freq: Once | INTRAMUSCULAR | Status: AC
  Administered 2024-06-26: 1 via INTRAMUSCULAR

## 2024-06-26 NOTE — Telephone Encounter (Signed)
 Sounds good

## 2024-06-26 NOTE — Telephone Encounter (Signed)
 Dr. Dea - I don't think he needs syphilis treatment based on his RPR trend. Do you?

## 2024-06-26 NOTE — Telephone Encounter (Signed)
 Please review labs/STI results. Patient has an appointment today with pharmacy.

## 2024-06-26 NOTE — Telephone Encounter (Signed)
 Thanks for the heads up. Will treat for gonorrhea today at his appointment.

## 2024-07-01 ENCOUNTER — Ambulatory Visit: Payer: Self-pay | Admitting: Infectious Diseases

## 2024-09-04 ENCOUNTER — Ambulatory Visit: Payer: Self-pay | Admitting: Infectious Diseases

## 2024-10-30 ENCOUNTER — Ambulatory Visit: Payer: Self-pay | Admitting: Pharmacist
